# Patient Record
Sex: Female | Born: 1980 | Race: Black or African American | Hispanic: No | Smoking: Never smoker
Health system: Southern US, Community
[De-identification: ages and names within clinical notes are randomized; demographics above are authoritative.]

## PROBLEM LIST (undated history)

## (undated) DIAGNOSIS — Z8619 Personal history of other infectious and parasitic diseases: Secondary | ICD-10-CM

## (undated) DIAGNOSIS — J45909 Unspecified asthma, uncomplicated: Secondary | ICD-10-CM

## (undated) DIAGNOSIS — L309 Dermatitis, unspecified: Secondary | ICD-10-CM

## (undated) DIAGNOSIS — N9489 Other specified conditions associated with female genital organs and menstrual cycle: Secondary | ICD-10-CM

## (undated) DIAGNOSIS — H539 Unspecified visual disturbance: Secondary | ICD-10-CM

## (undated) DIAGNOSIS — T7840XA Allergy, unspecified, initial encounter: Secondary | ICD-10-CM

## (undated) DIAGNOSIS — F32A Depression, unspecified: Secondary | ICD-10-CM

## (undated) DIAGNOSIS — F329 Major depressive disorder, single episode, unspecified: Secondary | ICD-10-CM

## (undated) DIAGNOSIS — E669 Obesity, unspecified: Secondary | ICD-10-CM

## (undated) DIAGNOSIS — K219 Gastro-esophageal reflux disease without esophagitis: Secondary | ICD-10-CM

## (undated) HISTORY — DX: Major depressive disorder, single episode, unspecified: F32.9

## (undated) HISTORY — DX: Unspecified visual disturbance: H53.9

## (undated) HISTORY — DX: Personal history of other infectious and parasitic diseases: Z86.19

## (undated) HISTORY — DX: Other specified conditions associated with female genital organs and menstrual cycle: N94.89

## (undated) HISTORY — DX: Dermatitis, unspecified: L30.9

## (undated) HISTORY — PX: WISDOM TOOTH EXTRACTION: SHX21

## (undated) HISTORY — DX: Gastro-esophageal reflux disease without esophagitis: K21.9

## (undated) HISTORY — DX: Depression, unspecified: F32.A

## (undated) HISTORY — DX: Allergy, unspecified, initial encounter: T78.40XA

## (undated) HISTORY — DX: Obesity, unspecified: E66.9

---

## 2012-07-26 DIAGNOSIS — E669 Obesity, unspecified: Secondary | ICD-10-CM | POA: Insufficient documentation

## 2012-07-26 DIAGNOSIS — J302 Other seasonal allergic rhinitis: Secondary | ICD-10-CM | POA: Insufficient documentation

## 2012-07-26 DIAGNOSIS — D219 Benign neoplasm of connective and other soft tissue, unspecified: Secondary | ICD-10-CM | POA: Insufficient documentation

## 2012-07-26 HISTORY — DX: Obesity, unspecified: E66.9

## 2012-08-24 DIAGNOSIS — Z62810 Personal history of physical and sexual abuse in childhood: Secondary | ICD-10-CM | POA: Insufficient documentation

## 2013-09-30 DIAGNOSIS — Z3009 Encounter for other general counseling and advice on contraception: Secondary | ICD-10-CM | POA: Insufficient documentation

## 2014-09-30 ENCOUNTER — Telehealth: Payer: Self-pay | Admitting: *Deleted

## 2014-09-30 ENCOUNTER — Encounter (HOSPITAL_COMMUNITY): Payer: Self-pay | Admitting: Emergency Medicine

## 2014-09-30 ENCOUNTER — Emergency Department (HOSPITAL_COMMUNITY)
Admission: EM | Admit: 2014-09-30 | Discharge: 2014-09-30 | Disposition: A | Discharge status: Home / Self Care | Payer: 59 | Attending: Emergency Medicine | Admitting: Emergency Medicine

## 2014-09-30 DIAGNOSIS — J45909 Unspecified asthma, uncomplicated: Secondary | ICD-10-CM | POA: Diagnosis not present

## 2014-09-30 DIAGNOSIS — Z7951 Long term (current) use of inhaled steroids: Secondary | ICD-10-CM | POA: Diagnosis not present

## 2014-09-30 DIAGNOSIS — R251 Tremor, unspecified: Secondary | ICD-10-CM | POA: Insufficient documentation

## 2014-09-30 DIAGNOSIS — IMO0001 Reserved for inherently not codable concepts without codable children: Secondary | ICD-10-CM

## 2014-09-30 HISTORY — DX: Unspecified asthma, uncomplicated: J45.909

## 2014-09-30 LAB — COMPREHENSIVE METABOLIC PANEL
ALK PHOS: 55 U/L (ref 38–126)
ALT: 16 U/L (ref 14–54)
ANION GAP: 11 (ref 5–15)
AST: 21 U/L (ref 15–41)
Albumin: 3.5 g/dL (ref 3.5–5.0)
BILIRUBIN TOTAL: 0.8 mg/dL (ref 0.3–1.2)
BUN: 8 mg/dL (ref 6–20)
CO2: 21 mmol/L — ABNORMAL LOW (ref 22–32)
Calcium: 8.7 mg/dL — ABNORMAL LOW (ref 8.9–10.3)
Chloride: 103 mmol/L (ref 101–111)
Creatinine, Ser: 0.76 mg/dL (ref 0.44–1.00)
GFR calc Af Amer: >60 mL/min (ref >60)
GFR calc non Af Amer: >60 mL/min (ref >60)
GLUCOSE: 95 mg/dL (ref 65–99)
POTASSIUM: 3.4 mmol/L — AB (ref 3.5–5.1)
Sodium: 135 mmol/L (ref 135–145)
TOTAL PROTEIN: 7.1 g/dL (ref 6.5–8.1)

## 2014-09-30 LAB — CBC WITH DIFFERENTIAL/PLATELET
BASOS ABS: 0 10*3/uL (ref 0.0–0.1)
Basophils Relative: 0 % (ref 0–1)
EOS ABS: 0.1 10*3/uL (ref 0.0–0.7)
Eosinophils Relative: 1 % (ref 0–5)
HEMATOCRIT: 37.7 % (ref 36.0–46.0)
Hemoglobin: 13 g/dL (ref 12.0–15.0)
LYMPHS PCT: 14 % (ref 12–46)
Lymphs Abs: 1.3 10*3/uL (ref 0.7–4.0)
MCH: 30.8 pg (ref 26.0–34.0)
MCHC: 34.5 g/dL (ref 30.0–36.0)
MCV: 89.3 fL (ref 78.0–100.0)
Monocytes Absolute: 0.5 10*3/uL (ref 0.1–1.0)
Monocytes Relative: 6 % (ref 3–12)
NEUTROS PCT: 79 % — AB (ref 43–77)
Neutro Abs: 7.1 10*3/uL (ref 1.7–7.7)
PLATELETS: 332 10*3/uL (ref 150–400)
RBC: 4.22 MIL/uL (ref 3.87–5.11)
RDW: 12.7 % (ref 11.5–15.5)
WBC: 9.1 10*3/uL (ref 4.0–10.5)

## 2014-09-30 MED ORDER — POTASSIUM CHLORIDE CRYS ER 20 MEQ PO TBCR
40.0000 meq | EXTENDED_RELEASE_TABLET | Freq: Once | ORAL | Status: DC
  Filled 2014-09-30: qty 2

## 2014-09-30 MED ORDER — DIAZEPAM 5 MG PO TABS
5.0000 mg | ORAL_TABLET | Freq: Once | ORAL | Status: AC
  Administered 2014-09-30: 5 mg via ORAL
  Filled 2014-09-30: qty 1

## 2014-09-30 NOTE — Discharge Instructions (Signed)
Dystonias The dystonias are movement disorders in which sustained muscle contractions cause twisting and repetitive movements or abnormal postures. The movements, which are involuntary and sometimes painful, may affect a single muscle; a group of muscles such as those in the arms, legs, or neck; or the entire body. Early symptoms (problems) may include a deterioration in handwriting after writing several lines, foot cramps, and a tendency of one foot to pull up or drag after running or walking some distance. Other possible symptoms are tremor and voice or speech difficulties. Birth injury (particularly due to lack of oxygen), certain infections, reactions to certain drugs, heavy-metal or carbon monoxide poisoning, trauma (damage caused by an accident), or stroke can cause dystonic symptoms. About half the cases of dystonia have no connection to disease or injury and are called primary or idiopathic dystonia. Of the primary dystonias, many cases appear to be inherited in a dominant manner. Dystonias can also be symptoms of other diseases, some of which may be hereditary (passed down from parents). In some individuals, symptoms of a dystonia appear spontaneously in childhood between the ages of 5 and 16, usually in the foot or in the hand. For other individuals, the symptoms emerge in late adolescence or early adulthood. TREATMENT  No one treatment has been found universally effective for dystonia. Instead, physicians use a variety of therapies (medications, surgery and other treatments such as physical therapy, splinting, stress management, and biofeedback), aimed at reducing or eliminating muscle spasms and pain. Since response to drugs varies among patients and even in the same person over time, the therapy must be individualized. PROGNOSIS The initial symptoms can be very mild and may be noticeable only after prolonged exertion, stress, or fatigue. Over a period of time, the symptoms may become more  noticeable and widespread and be unrelenting; sometimes, however, there is little or no progression. RESEARCH BEING DONE Investigators believe that the dystonias result from an abnormality in an area of the brain called the basal ganglia, where some of the messages that initiate muscle contractions are processed. Scientists suspect a defect in the body's ability to process a group of chemicals called neurotransmitters that help cells in the brain communicate with each other. Scientists at the [facility] laboratories have conducted detailed investigations of the pattern of muscle activity in persons with dystonias. Studies using EEG analysis and neuroimaging are probing brain activity. The search for the gene or genes responsible for some forms of dominantly inherited dystonias continues. 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 In 1997, the team sequenced the DYT1 gene and found that it codes for a previously unknown protein now called "torsin A." Document Released: 03/11/2002 Document Revised: 06/13/2011 Document Reviewed: 05/15/2013 [employer] Patient Information 2015 [employer]. This information is not intended to replace advice given to you by your health care provider. Make sure you discuss any questions you have with your health care provider.  

## 2014-09-30 NOTE — ED Notes (Signed)
Pt ambulated to restroom with this RN by pt side per pt request. Pt informed to use call light in restroom when ready to go back to room.

## 2014-09-30 NOTE — ED Provider Notes (Signed)
CSN: [id number]     Arrival date & time 09/30/14  0256 History   First MD Initiated Contact with Patient 09/30/14 0559     Chief Complaint  Patient presents with  . Shaking     (Consider location/radiation/quality/duration/timing/severity/associated sxs/prior Treatment) HPI Comments: Patient presents to the ED with a chief complaint of twitching.  Patient states that she has had intermittent episodes of twitching since she was young.  Describes the twitching as a shaking of her head and sometimes her body.  She has no history of seizures.  There are no associated aggravating or alleviating factors.  She has not tried taking anything for her symptoms.  She states that she has seen a neurologist in the past, but they were unable to do anything for her.  She has had several episodes of twitching tonight.  The history is provided by the patient. No language interpreter was used.    Past Medical History  Diagnosis Date  . Asthma    History reviewed. No pertinent past surgical history. No family history on file. History  Substance Use Topics  . Smoking status: Never Smoker   . Smokeless tobacco: Not on file  . Alcohol Use: No   OB History    No data available     Review of Systems  Constitutional: Negative for fever and chills.  Respiratory: Negative for shortness of breath.   Cardiovascular: Negative for chest pain.  Gastrointestinal: Negative for nausea, vomiting, diarrhea and constipation.  Genitourinary: Negative for dysuria.  Neurological:       Shaking of head and body  All other systems reviewed and are negative.     Allergies  Review of patient's allergies indicates no known allergies.  Home Medications   Prior to Admission medications   Medication Sig Start Date End Date Taking? Authorizing Provider  beclomethasone (QVAR) 40 MCG/ACT inhaler Inhale 2 puffs into the lungs daily as needed (shorntess of breath).   Yes Historical Provider, MD  norelgestromin-ethinyl  estradiol Marilu Favre) 150-35 MCG/24HR transdermal patch Place 1 patch onto the skin once a week.   Yes Historical Provider, MD   BP 119/88 mmHg  Pulse 89  Temp(Src) 98.2 F (36.8 C) (Oral)  Resp 16  SpO2 99% Physical Exam  Constitutional: She is oriented to person, place, and time. She appears well-developed and well-nourished.  HENT:  Head: Normocephalic and atraumatic.  Eyes: Conjunctivae and EOM are normal. Pupils are equal, round, and reactive to light.  Neck: Normal range of motion. Neck supple.  Cardiovascular: Normal rate and regular rhythm.  Exam reveals no gallop and no friction rub.   No murmur heard. Pulmonary/Chest: Effort normal and breath sounds normal. No respiratory distress. She has no wheezes. She has no rales. She exhibits no tenderness.  CTAB  Abdominal: Soft. Bowel sounds are normal. She exhibits no distension and no mass. There is no tenderness. There is no rebound and no guarding.  Musculoskeletal: Normal range of motion. She exhibits no edema or tenderness.  Neurological: She is alert and oriented to person, place, and time.  Cranial nerves grossly intact, movements are goal oriented, speech is clear  Skin: Skin is warm and dry.  Psychiatric: She has a normal mood and affect. Her behavior is normal. Judgment and thought content normal.  Nursing note and vitals reviewed.   ED Course  Procedures (including critical care time) Results for orders placed or performed during the hospital encounter of 09/30/14  CBC with Differential  Result Value Ref Range  WBC 9.1 4.0 - 10.5 K/uL   RBC 4.22 3.87 - 5.11 MIL/uL   Hemoglobin 13.0 12.0 - 15.0 g/dL   HCT 37.7 36.0 - 46.0 %   MCV 89.3 78.0 - 100.0 fL   MCH 30.8 26.0 - 34.0 pg   MCHC 34.5 30.0 - 36.0 g/dL   RDW 12.7 11.5 - 15.5 %   Platelets 332 150 - 400 K/uL   Neutrophils Relative % 79 (H) 43 - 77 %   Neutro Abs 7.1 1.7 - 7.7 K/uL   Lymphocytes Relative 14 12 - 46 %   Lymphs Abs 1.3 0.7 - 4.0 K/uL   Monocytes  Relative 6 3 - 12 %   Monocytes Absolute 0.5 0.1 - 1.0 K/uL   Eosinophils Relative 1 0 - 5 %   Eosinophils Absolute 0.1 0.0 - 0.7 K/uL   Basophils Relative 0 0 - 1 %   Basophils Absolute 0.0 0.0 - 0.1 K/uL  Comprehensive metabolic panel  Result Value Ref Range   Sodium 135 135 - 145 mmol/L   Potassium 3.4 (L) 3.5 - 5.1 mmol/L   Chloride 103 101 - 111 mmol/L   CO2 21 (L) 22 - 32 mmol/L   Glucose, Bld 95 65 - 99 mg/dL   BUN 8 6 - 20 mg/dL   Creatinine, Ser 0.76 0.44 - 1.00 mg/dL   Calcium 8.7 (L) 8.9 - 10.3 mg/dL   Total Protein 7.1 6.5 - 8.1 g/dL   Albumin 3.5 3.5 - 5.0 g/dL   AST 21 15 - 41 U/L   ALT 16 14 - 54 U/L   Alkaline Phosphatase 55 38 - 126 U/L   Total Bilirubin 0.8 0.3 - 1.2 mg/dL   GFR calc non Af Amer >60 >60 mL/min   GFR calc Af Amer >60 >60 mL/min   Anion gap 11 5 - 15   No results found.    EKG Interpretation None      MDM   Final diagnoses:  Shaking spells    Patient with rather bizarre episodes of twitching.  The motion is a rapid rotating back and forth of the trunk.  Patient is alert and oriented during these episodes.  She is neurologically intact.  I believe the symptoms to be psychosomatic in nature.  Labs are reassuring.  She is slightly hypokalemic; will replace potassium.  Will give valium.  DC to home with neurology/pysch follow-up.  Driving restrictions in place.  Patient understands and agrees with the plan.  She is stable and ready for discharge.    Montine Circle, PA-C 03/70/48 [id number]  Delora Fuel, MD 69/45/03 [id number]

## 2014-09-30 NOTE — Telephone Encounter (Signed)
Patient cancelled her new patient appointment she got in somewhere else sooner

## 2014-09-30 NOTE — ED Notes (Signed)
Pt. reports intermittent generalized shaking of body onset this morning , alert and oriented at arrival/ respirations unlabored .

## 2014-10-02 ENCOUNTER — Encounter: Payer: Self-pay | Admitting: Neurology

## 2014-10-02 ENCOUNTER — Ambulatory Visit (INDEPENDENT_AMBULATORY_CARE_PROVIDER_SITE_OTHER): Payer: 59 | Admitting: Neurology

## 2014-10-02 VITALS — BP 110/80 | HR 78 | Resp 12 | Ht 62.0 in | Wt 164.8 lb

## 2014-10-02 DIAGNOSIS — J4520 Mild intermittent asthma, uncomplicated: Secondary | ICD-10-CM | POA: Insufficient documentation

## 2014-10-02 DIAGNOSIS — F418 Other specified anxiety disorders: Secondary | ICD-10-CM

## 2014-10-02 DIAGNOSIS — F339 Major depressive disorder, recurrent, unspecified: Secondary | ICD-10-CM | POA: Insufficient documentation

## 2014-10-02 DIAGNOSIS — G4089 Other seizures: Secondary | ICD-10-CM | POA: Diagnosis not present

## 2014-10-02 DIAGNOSIS — R253 Fasciculation: Secondary | ICD-10-CM | POA: Diagnosis not present

## 2014-10-02 DIAGNOSIS — F445 Conversion disorder with seizures or convulsions: Secondary | ICD-10-CM | POA: Insufficient documentation

## 2014-10-02 DIAGNOSIS — F329 Major depressive disorder, single episode, unspecified: Secondary | ICD-10-CM | POA: Insufficient documentation

## 2014-10-02 MED ORDER — ESCITALOPRAM OXALATE 10 MG PO TABS: 10.0000 mg | ORAL_TABLET | Freq: Every day | ORAL | Status: DC
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
## 2014-10-03 DIAGNOSIS — F445 Conversion disorder with seizures or convulsions: Secondary | ICD-10-CM | POA: Insufficient documentation

## 2014-10-09 ENCOUNTER — Ambulatory Visit (INDEPENDENT_AMBULATORY_CARE_PROVIDER_SITE_OTHER): Payer: 59 | Admitting: Neurology

## 2014-10-09 DIAGNOSIS — R253 Fasciculation: Secondary | ICD-10-CM

## 2014-10-09 DIAGNOSIS — F445 Conversion disorder with seizures or convulsions: Secondary | ICD-10-CM

## 2014-10-10 ENCOUNTER — Telehealth: Payer: Self-pay | Admitting: *Deleted

## 2014-10-10 ENCOUNTER — Telehealth: Payer: Self-pay | Admitting: Neurology

## 2014-10-10 DIAGNOSIS — R569 Unspecified convulsions: Secondary | ICD-10-CM

## 2014-10-10 DIAGNOSIS — F445 Conversion disorder with seizures or convulsions: Secondary | ICD-10-CM

## 2014-10-10 DIAGNOSIS — F418 Other specified anxiety disorders: Secondary | ICD-10-CM

## 2014-10-10 NOTE — Procedures (Signed)
    History:  Jennifer Dean is a 34 year old patient with a history of episodes of upper body jerking and twisting that occurred on 09/30/2014. The patient had 8 or 9 such episodes lasting up to 15-30 seconds at a time. The patient is being evaluated for possible seizures.  This is a routine EEG. No skull defects are noted. Medications include Lexapro, and birth control pills.    EEG classification: Normal awake and drowsy  Description of the recording: The background rhythms of this recording consists of a fairly well modulated medium amplitude alpha rhythm of 9 Hz that is reactive to eye opening and closure. As the record progresses, the patient appears to remain in the waking state throughout the recording. Photic stimulation was performed, resulting in a bilateral and symmetric photic driving response. Hyperventilation was also performed, resulting in a minimal buildup of the background rhythm activities without significant slowing seen. Toward the end of the recording, the patient enters the drowsy state with slight symmetric slowing seen. The patient never enters stage II sleep. At no time during the recording does there appear to be evidence of spike or spike wave discharges or evidence of focal slowing. EKG monitor shows no evidence of cardiac rhythm abnormalities with a heart rate of 72.  Impression: This is a normal EEG recording in the waking and drowsy state. No evidence of ictal or interictal discharges are seen.

## 2014-10-10 NOTE — Telephone Encounter (Signed)
I have spoken with Jennifer Dean this morning and per RAS, advised that her EEG was normal; that episodes are most likely due to psych stress.  She verbalized understanding of same, sts. she has not gotten a call to schedule psych eval yet--order for referral to behavioral health entered today/fim

## 2014-10-10 NOTE — Telephone Encounter (Signed)
Patient calling requesting results from EEG. Please call and advise. Patient can be reached at [phone].

## 2014-10-10 NOTE — Telephone Encounter (Signed)
I have spoken with Jennifer Dean this morning and per RAS, advised that EEG was normal, that her episodes are likely due to psych stress.  She verbalized understanding of same, sts. she has not received a call to schedule psych eval yet.  Order for referral to [place] entered today/fim

## 2014-10-10 NOTE — Telephone Encounter (Signed)
Patient returned call. Please call and advise.  °

## 2014-10-10 NOTE — Telephone Encounter (Signed)
-----   Message from Britt Bottom, MD sent at 10/10/2014  8:35 AM EDT ----- Please let her know that the EEG was normal. As we discussed during the visit, the episodes most likely represent a reaction to psychiatric stress...  Has psych evaluation been scheduled yet/

## 2014-10-10 NOTE — Telephone Encounter (Signed)
LMTC./fim 

## 2014-10-30 ENCOUNTER — Ambulatory Visit: Payer: 59 | Admitting: Neurology

## 2014-11-10 ENCOUNTER — Ambulatory Visit (INDEPENDENT_AMBULATORY_CARE_PROVIDER_SITE_OTHER): Payer: 59 | Admitting: Licensed Clinical Social Worker

## 2014-11-10 DIAGNOSIS — F419 Anxiety disorder, unspecified: Secondary | ICD-10-CM | POA: Diagnosis not present

## 2014-11-26 ENCOUNTER — Ambulatory Visit: Payer: 59 | Admitting: Licensed Clinical Social Worker

## 2014-12-10 ENCOUNTER — Ambulatory Visit (INDEPENDENT_AMBULATORY_CARE_PROVIDER_SITE_OTHER): Payer: 59 | Admitting: Internal Medicine

## 2014-12-10 ENCOUNTER — Encounter: Payer: Self-pay | Admitting: Physician Assistant

## 2014-12-10 VITALS — BP 126/87 | HR 86 | Temp 99.5°F | Resp 16

## 2014-12-10 DIAGNOSIS — R0602 Shortness of breath: Secondary | ICD-10-CM

## 2014-12-10 DIAGNOSIS — Z8709 Personal history of other diseases of the respiratory system: Secondary | ICD-10-CM | POA: Diagnosis not present

## 2014-12-10 MED ORDER — ALBUTEROL SULFATE (2.5 MG/3ML) 0.083% IN NEBU: 2.5000 mg | INHALATION_SOLUTION | Freq: Four times a day (QID) | RESPIRATORY_TRACT | Status: DC | PRN

## 2014-12-10 MED ORDER — LORAZEPAM 1 MG PO TABS: 1.0000 mg | ORAL_TABLET | Freq: Two times a day (BID) | ORAL | Status: DC | PRN
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
## 2014-12-11 ENCOUNTER — Telehealth: Payer: Self-pay | Admitting: Family Medicine

## 2014-12-11 MED ORDER — FULL KIT NEBULIZER SET MISC: 1.0000 [IU] | Status: DC | PRN

## 2014-12-11 NOTE — Telephone Encounter (Signed)
Thank you Pamala Hurry! I will print it off and hand it to a TL.

## 2014-12-11 NOTE — Telephone Encounter (Signed)
Got fax back from [facility] stating that they do not carry nebulizer machines. They suggested we send order to [facility]. I called [facility] where Ronalee Belts having appts and spoke to Dante who will let Guerry Bruin know this needs to be re-sent. Advised that [facility] also has nebulizers.

## 2014-12-11 NOTE — Telephone Encounter (Signed)
Left message nebulizer sent to pharmacy.

## 2014-12-11 NOTE — Telephone Encounter (Signed)
Ronalee Belts, see below. You will need to print off Rx and have asst or TL fax to [facility] or [facility]. Thanks!

## 2014-12-11 NOTE — Telephone Encounter (Signed)
Patient was seen yesterday and when she went to the pharmacy, she was prescribed nebulizer meds instead of the nebulizer.  Can we send in the actual nebulizer prescription and notify her when it is sent in.

## 2014-12-11 NOTE — Telephone Encounter (Signed)
Script for nebulizer kit sent to her pharmacy.

## 2014-12-12 ENCOUNTER — Other Ambulatory Visit: Payer: Self-pay | Admitting: *Deleted

## 2014-12-12 MED ORDER — ALBUTEROL SULFATE HFA 108 (90 BASE) MCG/ACT IN AERS: 2.0000 | INHALATION_SPRAY | Freq: Four times a day (QID) | RESPIRATORY_TRACT | Status: DC | PRN

## 2014-12-12 NOTE — Telephone Encounter (Signed)
Faxed to [facility]

## 2014-12-12 NOTE — Telephone Encounter (Signed)
Pt wanted inhaler instead of nebulizer med.  Per Legrand Como inhaler ordered

## 2014-12-15 ENCOUNTER — Ambulatory Visit (INDEPENDENT_AMBULATORY_CARE_PROVIDER_SITE_OTHER): Payer: 59 | Admitting: Licensed Clinical Social Worker

## 2014-12-15 DIAGNOSIS — F419 Anxiety disorder, unspecified: Secondary | ICD-10-CM

## 2014-12-25 ENCOUNTER — Encounter: Payer: Self-pay | Admitting: Urgent Care

## 2014-12-25 ENCOUNTER — Ambulatory Visit (INDEPENDENT_AMBULATORY_CARE_PROVIDER_SITE_OTHER): Payer: 59 | Admitting: Urgent Care

## 2014-12-25 VITALS — BP 110/80 | HR 78 | Temp 98.0°F | Resp 16 | Ht 62.5 in | Wt 162.6 lb

## 2014-12-25 DIAGNOSIS — J302 Other seasonal allergic rhinitis: Secondary | ICD-10-CM | POA: Diagnosis not present

## 2014-12-25 DIAGNOSIS — H938X3 Other specified disorders of ear, bilateral: Secondary | ICD-10-CM | POA: Diagnosis not present

## 2014-12-25 DIAGNOSIS — Z8709 Personal history of other diseases of the respiratory system: Secondary | ICD-10-CM | POA: Diagnosis not present

## 2014-12-25 DIAGNOSIS — J329 Chronic sinusitis, unspecified: Secondary | ICD-10-CM | POA: Insufficient documentation

## 2014-12-25 DIAGNOSIS — R011 Cardiac murmur, unspecified: Secondary | ICD-10-CM

## 2014-12-25 DIAGNOSIS — R0981 Nasal congestion: Secondary | ICD-10-CM | POA: Diagnosis not present

## 2014-12-25 MED ORDER — LEVOCETIRIZINE DIHYDROCHLORIDE 5 MG PO TABS: 5.0000 mg | ORAL_TABLET | Freq: Every evening | ORAL | Status: DC

## 2014-12-25 MED ORDER — AMOXICILLIN 875 MG PO TABS: 875.0000 mg | ORAL_TABLET | Freq: Two times a day (BID) | ORAL | Status: DC

## 2014-12-25 MED ORDER — FLUTICASONE PROPIONATE 50 MCG/ACT NA SUSP: 2.0000 | Freq: Every day | NASAL | Status: DC

## 2014-12-25 NOTE — Patient Instructions (Signed)
- Try Sudafed 166m twice a day for immediate relief of nasal congestion and ear pressure. - For appropriate administration of the nasal spray, clear the nose, use opposite hand for opposite nare, sniff gently, exhale through your mouth. - Continue Allegra, Claritin or Zyrtec each day, as needed. - Drink at least 64 ounces of water each day. - If you have a humidifier use it nightly. - Remove as many irritants/allergies as you are able to, no pets in the bedroom, change air filters in air vents.   Sinusitis Sinusitis is redness, soreness, and inflammation of the paranasal sinuses. Paranasal sinuses are air pockets within the bones of your face (beneath the eyes, the middle of the forehead, or above the eyes). In healthy paranasal sinuses, mucus is able to drain out, and air is able to circulate through them by way of your nose. However, when your paranasal sinuses are inflamed, mucus and air can become trapped. This can allow bacteria and other germs to grow and cause infection. Sinusitis can develop quickly and last only a short time (acute) or continue over a long period (chronic). Sinusitis that lasts for more than 12 weeks is considered chronic.  CAUSES  Causes of sinusitis include:  Allergies.  Structural abnormalities, such as displacement of the cartilage that separates your nostrils (deviated septum), which can decrease the air flow through your nose and sinuses and affect sinus drainage.  Functional abnormalities, such as when the small hairs (cilia) that line your sinuses and help remove mucus do not work properly or are not present. SIGNS AND SYMPTOMS  Symptoms of acute and chronic sinusitis are the same. The primary symptoms are pain and pressure around the affected sinuses. Other symptoms include:  Upper toothache.  Earache.  Headache.  Bad breath.  Decreased sense of smell and taste.  A cough, which worsens when you are lying flat.  Fatigue.  Fever.  Thick drainage  from your nose, which often is green and may contain pus (purulent).  Swelling and warmth over the affected sinuses. DIAGNOSIS  Your health care provider will perform a physical exam. During the exam, your health care provider may:  Look in your nose for signs of abnormal growths in your nostrils (nasal polyps).  Tap over the affected sinus to check for signs of infection.  View the inside of your sinuses (endoscopy) using an imaging device that has a light attached (endoscope). If your health care provider suspects that you have chronic sinusitis, one or more of the following tests may be recommended:  Allergy tests.  Nasal culture. A sample of mucus is taken from your nose, sent to a lab, and screened for bacteria.  Nasal cytology. A sample of mucus is taken from your nose and examined by your health care provider to determine if your sinusitis is related to an allergy. TREATMENT  Most cases of acute sinusitis are related to a viral infection and will resolve on their own within 10 days. Sometimes medicines are prescribed to help relieve symptoms (pain medicine, decongestants, nasal steroid sprays, or saline sprays).  However, for sinusitis related to a bacterial infection, your health care provider will prescribe antibiotic medicines. These are medicines that will help kill the bacteria causing the infection.  Rarely, sinusitis is caused by a fungal infection. In theses cases, your health care provider will prescribe antifungal medicine. For some cases of chronic sinusitis, surgery is needed. Generally, these are cases in which sinusitis recurs more than 3 times per year, despite other treatments.  HOME CARE INSTRUCTIONS   Drink plenty of water. Water helps thin the mucus so your sinuses can drain more easily.  Use a humidifier.  Inhale steam 3 to 4 times a day (for example, sit in the bathroom with the shower running).  Apply a warm, moist washcloth to your face 3 to 4 times a day,  or as directed by your health care provider.  Use saline nasal sprays to help moisten and clean your sinuses.  Take medicines only as directed by your health care provider.  If you were prescribed either an antibiotic or antifungal medicine, finish it all even if you start to feel better. SEEK IMMEDIATE MEDICAL CARE IF:  You have increasing pain or severe headaches.  You have nausea, vomiting, or drowsiness.  You have swelling around your face.  You have vision problems.  You have a stiff neck.  You have difficulty breathing. MAKE SURE YOU:   Understand these instructions.  Will watch your condition.  Will get help right away if you are not doing well or get worse. Document Released: 03/21/2005 Document Revised: 08/05/2013 Document Reviewed: 04/05/2011 [facility] Patient Information 2015 [place], [place]. This information is not intended to replace advice given to you by your health care provider. Make sure you discuss any questions you have with your health care provider.   Allergies Allergies may happen from anything your body is sensitive to. This may be food, medicines, pollens, chemicals, and nearly anything around you in everyday life that produces allergens. An allergen is anything that causes an allergy producing substance. Heredity is often a factor in causing these problems. This means you may have some of the same allergies as your parents. Food allergies happen in all age groups. Food allergies are some of the most severe and life threatening. Some common food allergies are cow's milk, seafood, eggs, nuts, wheat, and soybeans. SYMPTOMS   Swelling around the mouth.  An itchy red rash or hives.  Vomiting or diarrhea.  Difficulty breathing. SEVERE ALLERGIC REACTIONS ARE LIFE-THREATENING. This reaction is called anaphylaxis. It can cause the mouth and throat to swell and cause difficulty with breathing and swallowing. In severe reactions only a trace amount of food  (for example, peanut oil in a salad) may cause death within seconds. Seasonal allergies occur in all age groups. These are seasonal because they usually occur during the same season every year. They may be a reaction to molds, grass pollens, or tree pollens. Other causes of problems are house dust mite allergens, pet dander, and mold spores. The symptoms often consist of nasal congestion, a runny itchy nose associated with sneezing, and tearing itchy eyes. There is often an associated itching of the mouth and ears. The problems happen when you come in contact with pollens and other allergens. Allergens are the particles in the air that the body reacts to with an allergic reaction. This causes you to release allergic antibodies. Through a chain of events, these eventually cause you to release histamine into the blood stream. Although it is meant to be protective to the body, it is this release that causes your discomfort. This is why you were given anti-histamines to feel better. If you are unable to pinpoint the offending allergen, it may be determined by skin or blood testing. Allergies cannot be cured but can be controlled with medicine. Hay fever is a collection of all or some of the seasonal allergy problems. It may often be treated with simple over-the-counter medicine such as diphenhydramine. Take  medicine as directed. Do not drink alcohol or drive while taking this medicine. Check with your caregiver or package insert for child dosages. If these medicines are not effective, there are many new medicines your caregiver can prescribe. Stronger medicine such as nasal spray, eye drops, and corticosteroids may be used if the first things you try do not work well. Other treatments such as immunotherapy or desensitizing injections can be used if all else fails. Follow up with your caregiver if problems continue. These seasonal allergies are usually not life threatening. They are generally more of a nuisance that  can often be handled using medicine. HOME CARE INSTRUCTIONS   If unsure what causes a reaction, keep a diary of foods eaten and symptoms that follow. Avoid foods that cause reactions.  If hives or rash are present:  Take medicine as directed.  You may use an over-the-counter antihistamine (diphenhydramine) for hives and itching as needed.  Apply cold compresses (cloths) to the skin or take baths in cool water. Avoid hot baths or showers. Heat will make a rash and itching worse.  If you are severely allergic:  Following a treatment for a severe reaction, hospitalization is often required for closer follow-up.  Wear a medic-alert bracelet or necklace stating the allergy.  You and your family must learn how to give adrenaline or use an anaphylaxis kit.  If you have had a severe reaction, always carry your anaphylaxis kit or EpiPen with you. Use this medicine as directed by your caregiver if a severe reaction is occurring. Failure to do so could have a fatal outcome. SEEK MEDICAL CARE IF:  You suspect a food allergy. Symptoms generally happen within 30 minutes of eating a food.  Your symptoms have not gone away within 2 days or are getting worse.  You develop new symptoms.  You want to retest yourself or your child with a food or drink you think causes an allergic reaction. Never do this if an anaphylactic reaction to that food or drink has happened before. Only do this under the care of a caregiver. SEEK IMMEDIATE MEDICAL CARE IF:   You have difficulty breathing, are wheezing, or have a tight feeling in your chest or throat.  You have a swollen mouth, or you have hives, swelling, or itching all over your body.  You have had a severe reaction that has responded to your anaphylaxis kit or an EpiPen. These reactions may return when the medicine has worn off. These reactions should be considered life threatening. MAKE SURE YOU:   Understand these instructions.  Will watch your  condition.  Will get help right away if you are not doing well or get worse. Document Released: 06/14/2002 Document Revised: 07/16/2012 Document Reviewed: 11/19/2007 [facility] Patient Information 2015 [place], [place]. This information is not intended to replace advice given to you by your health care provider. Make sure you discuss any questions you have with your health care provider.

## 2014-12-25 NOTE — Progress Notes (Signed)
    MRN: [id number] DOB: 1980-10-11  Subjective:   Jennifer Dean is a 34 y.o. female presenting for chief complaint of EAR PRESSURE and Cough  Reports ~1.5 month history of sinus congestion, allergies. In the past week, has progressed to sinus headache, sinus pain, ear pressure, dizziness, dry cough, dry scratchy throat. Has been taking benadryl nightly, generic sinus medication. Denies fever, red eyes, ear pain, ear drainage, tooth pain, chest pain, shob, wheezing, n/v, abdominal pain. Denies any other aggravating or relieving factors, no other questions or concerns.  Jennifer Dean has a current medication list which includes the following prescription(s): albuterol, escitalopram, lorazepam, norelgestromin-ethinyl estradiol, full kit nebulizer set, tetrahydrozoline, and albuterol. Also is allergic to morphine and related and codeine.  Jennifer Dean  has a past medical history of Asthma and Vision abnormalities. Also  has no past surgical history on file.  Objective:   Vitals: BP 110/80 mmHg  Pulse 78  Temp(Src) 98 F (36.7 C) (Oral)  Resp 16  Ht 5' 2.5" (1.588 m)  Wt 162 lb 9.6 oz (73.755 kg)  BMI 29.25 kg/m2  LMP 12/17/2014  Physical Exam  Constitutional: She is oriented to person, place, and time. She appears well-developed and well-nourished.  HENT:  TM's flat bilaterally, no effusions or erythema. Nasal turbinates boggy and edematous with 1 possible polyp in right side. No sinus tenderness. Throat without oropharyngeal exudates, erythema or abscesses.   Eyes: Conjunctivae and EOM are normal. Pupils are equal, round, and reactive to light. Right eye exhibits no discharge. Left eye exhibits no discharge. No scleral icterus.  Neck: Normal range of motion. Neck supple.  Cardiovascular: Normal rate, regular rhythm and intact distal pulses.  Exam reveals no gallop and no friction rub.   Murmur (low grade I/VI systolic ejection murmur) heard. Pulmonary/Chest: No respiratory distress. She  has no wheezes. She has no rales.  Lymphadenopathy:    She has no cervical adenopathy.  Neurological: She is alert and oriented to person, place, and time.  Skin: Skin is warm and dry. No rash noted. No erythema. No pallor.   Assessment and Plan :   1. Sinusitis, unspecified chronicity, unspecified location 2. Seasonal allergies 3. Ear pressure, bilateral 4. Nasal congestion - Suspect sinusitis, possible ETD secondary to long-standing uncontrolled allergies. Start amoxicillin for 10 days to cover for infectious process. Advise patient control allergies better and more consistently with Flonase and that is the tear is seen daily. Reviewed allergy treatment instructions with patient. She is to followup with me in 2 weeks and if no improvement in her symptoms we'll refer her to ENT. Patient agreed.  5. History of asthma - Stable  6. Heart murmur - Suspect this is a functional heart murmur. Monitor.  Jaynee Eagles, PA-C [facility] [phone] 12/25/2014 2:17 PM

## 2015-01-01 ENCOUNTER — Ambulatory Visit: Payer: 59 | Admitting: Urgent Care

## 2015-01-01 ENCOUNTER — Encounter: Payer: 59 | Admitting: Urgent Care

## 2015-01-07 ENCOUNTER — Ambulatory Visit: Payer: 59 | Admitting: Neurology

## 2015-01-20 ENCOUNTER — Encounter: Payer: Self-pay | Admitting: Neurology

## 2015-01-20 ENCOUNTER — Ambulatory Visit (INDEPENDENT_AMBULATORY_CARE_PROVIDER_SITE_OTHER): Payer: 59 | Admitting: Neurology

## 2015-01-20 VITALS — BP 124/88 | HR 70 | Resp 14 | Ht 62.5 in | Wt 164.6 lb

## 2015-01-20 DIAGNOSIS — F445 Conversion disorder with seizures or convulsions: Secondary | ICD-10-CM

## 2015-01-20 DIAGNOSIS — F418 Other specified anxiety disorders: Secondary | ICD-10-CM

## 2015-01-20 DIAGNOSIS — G4700 Insomnia, unspecified: Secondary | ICD-10-CM

## 2015-01-20 MED ORDER — TRAZODONE HCL 50 MG PO TABS: 50.0000 mg | ORAL_TABLET | Freq: Every day | ORAL | Status: DC
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
## 2015-02-10 ENCOUNTER — Ambulatory Visit (INDEPENDENT_AMBULATORY_CARE_PROVIDER_SITE_OTHER): Payer: 59 | Admitting: Family Medicine

## 2015-02-10 ENCOUNTER — Encounter: Payer: Self-pay | Admitting: Family Medicine

## 2015-02-10 VITALS — BP 123/77 | HR 76 | Temp 97.4°F | Resp 16 | Ht 62.5 in | Wt 166.0 lb

## 2015-02-10 DIAGNOSIS — J4521 Mild intermittent asthma with (acute) exacerbation: Secondary | ICD-10-CM

## 2015-02-10 DIAGNOSIS — R05 Cough: Secondary | ICD-10-CM | POA: Diagnosis not present

## 2015-02-10 DIAGNOSIS — R059 Cough, unspecified: Secondary | ICD-10-CM

## 2015-02-10 MED ORDER — PREDNISONE 20 MG PO TABS: 40.0000 mg | ORAL_TABLET | Freq: Every day | ORAL | Status: DC

## 2015-02-10 MED ORDER — BECLOMETHASONE DIPROPIONATE 40 MCG/ACT IN AERS: 1.0000 | INHALATION_SPRAY | Freq: Two times a day (BID) | RESPIRATORY_TRACT | Status: DC

## 2015-02-10 MED ORDER — BENZONATATE 100 MG PO CAPS: 100.0000 mg | ORAL_CAPSULE | Freq: Three times a day (TID) | ORAL | Status: DC | PRN

## 2015-02-10 NOTE — Patient Instructions (Signed)
Use your albuterol inhaler every 4-6 hours for the next 3 days Asthma, Adult Asthma is a recurring condition in which the airways tighten and narrow. Asthma can make it difficult to breathe. It can cause coughing, wheezing, and shortness of breath. Asthma episodes, also called asthma attacks, range from minor to life-threatening. Asthma cannot be cured, but medicines and lifestyle changes can help control it. CAUSES Asthma is believed to be caused by inherited (genetic) and environmental factors, but its exact cause is unknown. Asthma may be triggered by allergens, lung infections, or irritants in the air. Asthma triggers are different for each person. Common triggers include:   Animal dander.  Dust mites.  Cockroaches.  Pollen from trees or grass.  Mold.  Smoke.  Air pollutants such as dust, household cleaners, hair sprays, aerosol sprays, paint fumes, strong chemicals, or strong odors.  Cold air, weather changes, and winds (which increase molds and pollens in the air).  Strong emotional expressions such as crying or laughing hard.  Stress.  Certain medicines (such as aspirin) or types of drugs (such as beta-blockers).  Sulfites in foods and drinks. Foods and drinks that may contain sulfites include dried fruit, potato chips, and sparkling grape juice.  Infections or inflammatory conditions such as the flu, a cold, or an inflammation of the nasal membranes (rhinitis).  Gastroesophageal reflux disease (GERD).  Exercise or strenuous activity. SYMPTOMS Symptoms may occur immediately after asthma is triggered or many hours later. Symptoms include:  Wheezing.  Excessive nighttime or early morning coughing.  Frequent or severe coughing with a common cold.  Chest tightness.  Shortness of breath. DIAGNOSIS  The diagnosis of asthma is made by a review of your medical history and a physical exam. Tests may also be performed. These may include:  Lung function studies. These  tests show how much air you breathe in and out.  Allergy tests.  Imaging tests such as X-rays. TREATMENT  Asthma cannot be cured, but it can usually be controlled. Treatment involves identifying and avoiding your asthma triggers. It also involves medicines. There are 2 classes of medicine used for asthma treatment:   Controller medicines. These prevent asthma symptoms from occurring. They are usually taken every day.  Reliever or rescue medicines. These quickly relieve asthma symptoms. They are used as needed and provide short-term relief. Your health care provider will help you create an asthma action plan. An asthma action plan is a written plan for managing and treating your asthma attacks. It includes a list of your asthma triggers and how they may be avoided. It also includes information on when medicines should be taken and when their dosage should be changed. An action plan may also involve the use of a device called a peak flow meter. A peak flow meter measures how well the lungs are working. It helps you monitor your condition. HOME CARE INSTRUCTIONS   Take medicines only as directed by your health care provider. Speak with your health care provider if you have questions about how or when to take the medicines.  Use a peak flow meter as directed by your health care provider. Record and keep track of readings.  Understand and use the action plan to help minimize or stop an asthma attack without needing to seek medical care.  Control your home environment in the following ways to help prevent asthma attacks:  Do not smoke. Avoid being exposed to secondhand smoke.  Change your heating and air conditioning filter regularly.  Limit your use of  fireplaces and wood stoves.  Get rid of pests (such as roaches and mice) and their droppings.  Throw away plants if you see mold on them.  Clean your floors and dust regularly. Use unscented cleaning products.  Try to have someone else  vacuum for you regularly. Stay out of rooms while they are being vacuumed and for a short while afterward. If you vacuum, use a dust mask from a hardware store, a double-layered or microfilter vacuum cleaner bag, or a vacuum cleaner with a HEPA filter.  Replace carpet with wood, tile, or vinyl flooring. Carpet can trap dander and dust.  Use allergy-proof pillows, mattress covers, and box spring covers.  Wash bed sheets and blankets every week in hot water and dry them in a dryer.  Use blankets that are made of polyester or cotton.  Clean bathrooms and kitchens with bleach. If possible, have someone repaint the walls in these rooms with mold-resistant paint. Keep out of the rooms that are being cleaned and painted.  Wash hands frequently. SEEK MEDICAL CARE IF:   You have wheezing, shortness of breath, or a cough even if taking medicine to prevent attacks.  The colored mucus you cough up (sputum) is thicker than usual.  Your sputum changes from clear or white to yellow, green, gray, or bloody.  You have any problems that may be related to the medicines you are taking (such as a rash, itching, swelling, or trouble breathing).  You are using a reliever medicine more than 2-3 times per week.  Your peak flow is still at 50-79% of your personal best after following your action plan for 1 hour.  You have a fever. SEEK IMMEDIATE MEDICAL CARE IF:   You seem to be getting worse and are unresponsive to treatment during an asthma attack.  You are short of breath even at rest.  You get short of breath when doing very little physical activity.  You have difficulty eating, drinking, or talking due to asthma symptoms.  You develop chest pain.  You develop a fast heartbeat.  You have a bluish color to your lips or fingernails.  You are light-headed, dizzy, or faint.  Your peak flow is less than 50% of your personal best.   This information is not intended to replace advice given to you  by your health care provider. Make sure you discuss any questions you have with your health care provider.   Document Released: 03/21/2005 Document Revised: 12/10/2014 Document Reviewed: 10/18/2012 Elsevier Interactive Patient Education [employer].

## 2015-02-10 NOTE — Progress Notes (Signed)
   Subjective:    Patient ID: Jennifer Dean, female    DOB: 02/22/1981, 34 y.o.   MRN: [id number]  HPI This is a pleasant 34 yo female who works in the clerical department at [employer]. She presents today with persistent nonproductive cough for several months. Her cough is dry and she has coughing spasms. Some relief with cough drops. She has tried prevacid, robitussin, mucinex DM without improvement. She has gotten some short term relief from using her albuterol inhaler. She can cough at any time, but has noticed that she has worsening cough when eating and at night. Has had throat clearing throughout the day. No nasal drainage. No headache, no ear pain, no sore throat. No wheezing. No fever. No SOB. No heart burn, no water brash, no abdominal pain, no nausea, no vomiting, no diarrhea or constipation.   She has a history of asthma since childhood and has always been maintained on albuterol inhaler.   Past Medical History  Diagnosis Date  . Asthma   . Vision abnormalities    No past surgical history on file. Family History  Problem Relation Age of Onset  . Hypertension Mother   . GER disease Mother   . Hypertension Father   . Post-traumatic stress disorder Father    Social History  Substance Use Topics  . Smoking status: Never Smoker   . Smokeless tobacco: None  . Alcohol Use: No    Review of Systems Per HPI    Objective:   Physical Exam  Constitutional: She is oriented to person, place, and time. She appears well-developed and well-nourished. No distress.  HENT:  Head: Normocephalic and atraumatic.  Right Ear: External ear normal.  Left Ear: External ear normal.  Post nasal drainage   Eyes: Conjunctivae are normal. Pupils are equal, round, and reactive to light.  Cardiovascular: Normal rate, regular rhythm and normal heart sounds.   Pulmonary/Chest: Effort normal and breath sounds normal.  Musculoskeletal: Normal range of motion.  Neurological: She is alert and oriented  to person, place, and time.  Skin: Skin is warm and dry. She is not diaphoretic.  Psychiatric: She has a normal mood and affect. Her behavior is normal. Judgment and thought content normal.  Vitals reviewed.  BP 123/77 mmHg  Pulse 76  Temp(Src) 97.4 F (36.3 C)  Resp 16  Ht 5' 2.5" (1.588 m)  Wt 166 lb (75.297 kg)  BMI 29.86 kg/m2  Wt Readings from Last 3 Encounters:  02/10/15 166 lb (75.297 kg)  01/20/15 164 lb 9.6 oz (74.662 kg)  12/25/14 162 lb 9.6 oz (73.755 kg)   PF- pre albuterol inhaler 320 (66% predicted) , post 2 puffs albuterol inhaler 340 (70% predicted)     Assessment & Plan:  1. Asthma, mild intermittent, with acute exacerbation - beclomethasone (QVAR) 40 MCG/ACT inhaler; Inhale 1 puff into the lungs 2 (two) times daily.  Dispense: 1 Inhaler; Refill: 12 - predniSONE (DELTASONE) 20 MG tablet; Take 2 tablets (40 mg total) by mouth daily with breakfast.  Dispense: 6 tablet; Refill: 0  2. Cough - benzonatate (TESSALON) 100 MG capsule; Take 1-2 capsules (100-200 mg total) by mouth 3 (three) times daily as needed for cough.  Dispense: 40 capsule; Refill: 0  - follow up in 4-6 weeks and check symptoms and PFTs, RTC sooner if fever, worsening symptoms, purulent or bloody sputum.   Clarene Reamer, FNP-BC  [facility]  02/12/2015 9:15 AM

## 2015-02-23 ENCOUNTER — Other Ambulatory Visit: Payer: Self-pay | Admitting: Physician Assistant

## 2015-02-24 ENCOUNTER — Telehealth: Payer: Self-pay | Admitting: Family Medicine

## 2015-02-24 MED ORDER — ALBUTEROL SULFATE HFA 108 (90 BASE) MCG/ACT IN AERS: 2.0000 | INHALATION_SPRAY | Freq: Four times a day (QID) | RESPIRATORY_TRACT | Status: DC | PRN

## 2015-02-24 NOTE — Telephone Encounter (Signed)
Has been using albuterol inhaler a couple times a day, is out. Cough has been getting worse. Some relief with tessalon pearls. No wheezing. Cough nonproductive. She was instructed to increase Qvar to 2 puffs BID. Albuterol refill sent to pharmacy. Follow up 48 hours if not better.

## 2015-02-24 NOTE — Telephone Encounter (Signed)
Patient called requesting a refill on inhaler. Patient cough is getting worse. Patient experienced coughing spell and vomited. Patient is taking Claritin as instructed. Please call patient at [phone].

## 2015-02-25 ENCOUNTER — Other Ambulatory Visit: Payer: Self-pay | Admitting: Family Medicine

## 2015-02-27 ENCOUNTER — Ambulatory Visit (INDEPENDENT_AMBULATORY_CARE_PROVIDER_SITE_OTHER): Payer: 59

## 2015-02-27 ENCOUNTER — Ambulatory Visit (INDEPENDENT_AMBULATORY_CARE_PROVIDER_SITE_OTHER): Payer: 59 | Admitting: Family Medicine

## 2015-02-27 VITALS — BP 110/72 | HR 86 | Temp 98.0°F | Resp 18 | Ht 62.0 in | Wt 168.8 lb

## 2015-02-27 DIAGNOSIS — R05 Cough: Secondary | ICD-10-CM

## 2015-02-27 DIAGNOSIS — R053 Chronic cough: Secondary | ICD-10-CM

## 2015-02-27 DIAGNOSIS — E876 Hypokalemia: Secondary | ICD-10-CM | POA: Diagnosis not present

## 2015-02-27 MED ORDER — AZITHROMYCIN 250 MG PO TABS: ORAL_TABLET | ORAL | Status: DC
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
## 2015-02-27 NOTE — Patient Instructions (Signed)
Try the antibiotic and let me know if it does not finally get rid of your cough

## 2015-02-28 LAB — BASIC METABOLIC PANEL
BUN: 11 mg/dL (ref 7–25)
CO2: 27 mmol/L (ref 20–31)
Calcium: 9.1 mg/dL (ref 8.6–10.2)
Chloride: 102 mmol/L (ref 98–110)
Creat: 0.81 mg/dL (ref 0.50–1.10)
GLUCOSE: 94 mg/dL (ref 65–99)
Potassium: 4.7 mmol/L (ref 3.5–5.3)
Sodium: 136 mmol/L (ref 135–146)

## 2015-03-03 ENCOUNTER — Telehealth: Payer: Self-pay

## 2015-03-03 ENCOUNTER — Telehealth: Payer: Self-pay | Admitting: Family Medicine

## 2015-03-03 ENCOUNTER — Ambulatory Visit (INDEPENDENT_AMBULATORY_CARE_PROVIDER_SITE_OTHER): Payer: 59 | Admitting: Internal Medicine

## 2015-03-03 VITALS — BP 120/76 | HR 84 | Temp 98.1°F | Resp 18 | Ht 62.0 in

## 2015-03-03 DIAGNOSIS — B370 Candidal stomatitis: Secondary | ICD-10-CM | POA: Diagnosis not present

## 2015-03-03 LAB — POCT SKIN KOH: Skin KOH, POC: POSITIVE

## 2015-03-03 NOTE — Telephone Encounter (Signed)
Called but no answer.  LMOM- what does she mean by allergic reaction?  If swelling, hives, SOB please seek emergency care.  If this means that she threw up, just stop taking it.  It sounds like she got 3 days of the azithromcycin which may be enough.  We are glad to re-evaluate her in the office at her convenience.

## 2015-03-03 NOTE — Telephone Encounter (Signed)
Spoke with pt, she would like something for the cough. She states her lip is turning white at the bottom and spreading. I advised her to come in tonight to be seen. SHe agreed.

## 2015-03-03 NOTE — Telephone Encounter (Signed)
Patient is having a allergic reaction to the Z-pack. Patient is experiencing white residue around her lips. Patient coughed all night and caused her to throw up. Patient want to know if she should continue the last dosage of the medication. Patient is very concerned. Please call the patient at [phone].

## 2015-03-03 NOTE — Telephone Encounter (Signed)
Patient called stated she is having a allergic reaction to the Z-Pack. She didn't take the medication yesterday. Coughed through the night and threw up. Inside of her lip has white stuff. Please call the patient at [phone]

## 2015-03-03 NOTE — Progress Notes (Signed)
   Subjective:    Patient ID: Jennifer Dean, female    DOB: 12-Feb-1981, 35 y.o.   MRN: [id number] This chart was scribed for Tami Lin, MD by Marti Sleigh, Medical Scribe. This patient was seen in Room 11 and the patient's care was started a 8:12 PM.  Chief Complaint  Patient presents with  . Follow-up    possible reaction to z-pack. Noticed some whiteness inside of lips & feels irritated x 2-3 days. No other sx's    HPI HPI Comments: Jennifer Dean is a 34 y.o. female who presents to [facility] complaining of continued cough for the last four months, as well as a possible reaction to medication. She has a spreading white patch on her tongue and lip that started a couple of weeks after she started her current medications. She is taking q-var, tessalon pearls and a z-pac. She had a clear chest x-ray one week ago. She denies indigestion, trouble breathing through her nose at night. She denies wheezing. She states she has some mild SOB with exertion.    Review of Systems  Constitutional: Negative for fever and chills.  HENT: Negative for congestion and postnasal drip.   Respiratory: Positive for cough, shortness of breath and wheezing.   Cardiovascular: Negative for chest pain and palpitations.       Objective:  BP 120/76 mmHg  Pulse 84  Temp(Src) 98.1 F (36.7 C) (Oral)  Resp 18  Ht 5\' 2"  (1.575 m)  SpO2 97%  LMP 02/17/2015  Physical Exam  Constitutional: She appears well-developed and well-nourished. No distress.  HENT:  Head: Normocephalic and atraumatic.  White lacy exudate on both inner lips Scrapes off easily and micro shows multiple hyphae with buds  Eyes: Pupils are equal, round, and reactive to light.  Neck: Neck supple.  Cardiovascular: Normal rate and regular rhythm.   Pulmonary/Chest: Effort normal.  Minimal wheeze with forced expir  Lymphadenopathy:    She has no cervical adenopathy.  Skin: She is not diaphoretic.  Nursing note and vitals  reviewed.     Assessment & Plan:  Thrush -?Qvar Vs Zith D/c zith Brush tongue and lips after qvar Diflucan 10d --200 day1 and 100 9 more days written  Cough due to RAD following sinusitis??    I have completed the patient encounter in its entirety as documented by the scribe, with editing by me where necessary. Kelita Wallis P. Laney Pastor, M.D. By signing my name below, I, Judithe Modest, attest that this documentation has been prepared under the direction and in the presence of Tami Lin, MD. Electronically Signed: Judithe Modest, ER Scribe. 03/03/2015. 8:12 PM.

## 2015-03-06 ENCOUNTER — Other Ambulatory Visit: Payer: Self-pay | Admitting: Family Medicine

## 2015-03-06 NOTE — Telephone Encounter (Signed)
Patient request a refill of Tessalon 100 MG. [facility] outpatient on [address].

## 2015-03-09 NOTE — Telephone Encounter (Signed)
Debbie, do you want to RF this, or have pt RTC?

## 2015-04-07 ENCOUNTER — Telehealth: Payer: Self-pay | Admitting: Family Medicine

## 2015-04-07 DIAGNOSIS — J4521 Mild intermittent asthma with (acute) exacerbation: Secondary | ICD-10-CM

## 2015-04-07 NOTE — Telephone Encounter (Signed)
Patient request a refill of Q-Var.

## 2015-04-07 NOTE — Telephone Encounter (Signed)
beclomethasone (QVAR) 40 MCG/ACT inhaler [id number]      Order Details    Dose: 1 puff Route: Inhalation Frequency: 2 times daily   Dispense Quantity:  1 Inhaler Refills:  12 Fills Remaining:  12          Sig: Inhale 1 puff into the lungs 2 (two) times daily.         Written Date:  02/10/15 Expiration Date:  02/10/16     Start Date:  02/10/15 End Date:  --     Ordering Provider:  -- Authorizing Provider:  Elby Beck, FNP Ordering User:  Elby Beck, FNP         She has refills.

## 2015-04-08 MED ORDER — FLUCONAZOLE 150 MG PO TABS: 150.0000 mg | ORAL_TABLET | Freq: Once | ORAL | Status: DC

## 2015-04-08 MED FILL — QVAR 40 MCG ORAL INHALER: 40 | 30 days supply | Qty: 9 | Fill #1

## 2015-04-08 MED FILL — FLUCONAZOLE 150 MG TABLET: 150 | 21 days supply | Qty: 3 | Fill #0

## 2015-04-08 NOTE — Telephone Encounter (Signed)
I called Jennifer Dean, she has refills. Spoke with pt, she needs a refill on Diflucan. Can we refill?

## 2015-04-08 NOTE — Telephone Encounter (Signed)
Advised pt

## 2015-04-08 NOTE — Telephone Encounter (Signed)
Should be able to treat a different way to end this 150 weekly for 3 weeks

## 2015-04-21 MED FILL — ESCITALOPRAM 10 MG TABLET: 10 | 30 days supply | Qty: 30 | Fill #5

## 2015-04-22 MED FILL — VENTOLIN HFA 90 MCG INHALER: 108 (90 BAS | 25 days supply | Qty: 18 | Fill #1

## 2015-04-27 DIAGNOSIS — J301 Allergic rhinitis due to pollen: Secondary | ICD-10-CM | POA: Diagnosis not present

## 2015-04-27 DIAGNOSIS — F329 Major depressive disorder, single episode, unspecified: Secondary | ICD-10-CM | POA: Diagnosis not present

## 2015-04-27 DIAGNOSIS — J4520 Mild intermittent asthma, uncomplicated: Secondary | ICD-10-CM | POA: Diagnosis not present

## 2015-04-27 DIAGNOSIS — Z3041 Encounter for surveillance of contraceptive pills: Secondary | ICD-10-CM | POA: Diagnosis not present

## 2015-05-18 MED FILL — TRI-LO-ESTARYLLA TABLET: 0.18/0.215/ | 84 days supply | Qty: 84 | Fill #4

## 2015-05-20 ENCOUNTER — Telehealth (INDEPENDENT_AMBULATORY_CARE_PROVIDER_SITE_OTHER): Payer: Self-pay | Admitting: Internal Medicine

## 2015-05-20 DIAGNOSIS — J0120 Acute ethmoidal sinusitis, unspecified: Secondary | ICD-10-CM

## 2015-05-20 MED ORDER — LEVOFLOXACIN 500 MG PO TABS: 500.0000 mg | ORAL_TABLET | Freq: Every day | ORAL | Status: DC

## 2015-05-20 MED FILL — levoFLOXacin 500 MG TABS: 500 | 10 days supply | Qty: 10 | Fill #0

## 2015-05-20 NOTE — Telephone Encounter (Signed)
Rx for Levaquin sent to her pharmacy

## 2015-05-22 MED FILL — ESCITALOPRAM 10 MG TABLET: 10 | 30 days supply | Qty: 30 | Fill #6

## 2015-05-25 DIAGNOSIS — K219 Gastro-esophageal reflux disease without esophagitis: Secondary | ICD-10-CM | POA: Diagnosis not present

## 2015-05-25 DIAGNOSIS — J4520 Mild intermittent asthma, uncomplicated: Secondary | ICD-10-CM | POA: Diagnosis not present

## 2015-05-25 DIAGNOSIS — J301 Allergic rhinitis due to pollen: Secondary | ICD-10-CM | POA: Diagnosis not present

## 2015-05-25 DIAGNOSIS — F329 Major depressive disorder, single episode, unspecified: Secondary | ICD-10-CM | POA: Diagnosis not present

## 2015-05-25 MED FILL — FLUTICASONE PROP 50 MCG SPR: 50 | 30 days supply | Qty: 16 | Fill #0

## 2015-05-25 MED FILL — MONTELUKAST SOD 10 MG TAB: 10 | 30 days supply | Qty: 30 | Fill #0

## 2015-06-05 DIAGNOSIS — J029 Acute pharyngitis, unspecified: Secondary | ICD-10-CM | POA: Diagnosis not present

## 2015-06-22 MED FILL — ESCITALOPRAM 10 MG TABLET: 10 | 30 days supply | Qty: 30 | Fill #7

## 2015-07-03 DIAGNOSIS — J4520 Mild intermittent asthma, uncomplicated: Secondary | ICD-10-CM | POA: Diagnosis not present

## 2015-07-03 DIAGNOSIS — R05 Cough: Secondary | ICD-10-CM | POA: Diagnosis not present

## 2015-07-03 DIAGNOSIS — J301 Allergic rhinitis due to pollen: Secondary | ICD-10-CM | POA: Diagnosis not present

## 2015-07-03 MED FILL — FLUTICASONE PROP 50 MCG SPR: 50 | 30 days supply | Qty: 16 | Fill #1

## 2015-07-03 MED FILL — predniSONE 20 MG TABS: 20 | 7 days supply | Qty: 10 | Fill #0

## 2015-07-07 MED FILL — AZELASTINE HCL 137 MCG SPRY: 0.1 | 30 days supply | Qty: 30 | Fill #0

## 2015-07-10 MED FILL — BENZONATATE 200 MG CAPSULE: 200 | 7 days supply | Qty: 20 | Fill #0

## 2015-07-10 MED FILL — predniSONE 10 MG (48) TBPK: 10 | 6 days supply | Qty: 48 | Fill #0

## 2015-07-15 ENCOUNTER — Ambulatory Visit (INDEPENDENT_AMBULATORY_CARE_PROVIDER_SITE_OTHER): Payer: 59 | Admitting: Allergy and Immunology

## 2015-07-15 ENCOUNTER — Encounter: Payer: Self-pay | Admitting: Allergy and Immunology

## 2015-07-15 VITALS — BP 122/86 | HR 76 | Temp 98.3°F | Resp 16 | Ht 62.5 in | Wt 172.0 lb

## 2015-07-15 DIAGNOSIS — R053 Chronic cough: Secondary | ICD-10-CM | POA: Insufficient documentation

## 2015-07-15 DIAGNOSIS — J4540 Moderate persistent asthma, uncomplicated: Secondary | ICD-10-CM

## 2015-07-15 DIAGNOSIS — R05 Cough: Secondary | ICD-10-CM

## 2015-07-15 DIAGNOSIS — J3089 Other allergic rhinitis: Secondary | ICD-10-CM

## 2015-07-15 MED ORDER — BECLOMETHASONE DIPROPIONATE 80 MCG/ACT NA AERS: 1.0000 | INHALATION_SPRAY | Freq: Two times a day (BID) | NASAL | Status: DC

## 2015-07-15 MED ORDER — BUDESONIDE-FORMOTEROL FUMARATE 160-4.5 MCG/ACT IN AERO: 2.0000 | INHALATION_SPRAY | Freq: Two times a day (BID) | RESPIRATORY_TRACT | Status: DC

## 2015-07-15 MED ORDER — AEROCHAMBER PLUS MISC: Status: DC

## 2015-07-15 MED FILL — AEROCHAMBER: 30 days supply | Qty: 1 | Fill #0

## 2015-07-15 MED FILL — QNASL 80 MCG NASAL SPRAY: 80 | 30 days supply | Qty: 9 | Fill #0

## 2015-07-15 MED FILL — SYMBICORT 160-4.5 MCG INH: 160-4.5 | 30 days supply | Qty: 10 | Fill #0

## 2015-07-15 NOTE — Progress Notes (Signed)
New Patient Note  RE: Jennifer Dean MRN: [id number] DOB: [birth year] Date of Office Visit: 07/15/2015  Referring provider: Leamon Arnt, MD Primary care provider: Leamon Arnt, MD  Chief Complaint: Cough   History of present illness: HPI Comments: Jennifer Dean is a 35 y.o. female presenting today for consultation of a persistent cough.  She reports that she has suffered from a persistent cough for the past 9 months.  Cough is described as productive, occasionally resulting in posttussive emesis.  She states the cough seems to originate as a tickle in the base of the throat.  She denies wheezing, dyspnea, or chest tightness.  She experiences persistent thick postnasal drainage, throat irritation, and hoarseness, as well as occasional nasal congestion and sinus pressure. No significant seasonal symptom variation has been noted nor have specific environmental triggers been identified.  Over the past 2-4 weeks she has been started on azelastine nasal spray, montelukast, Nexium, and restarted fluticasone nasal spray.  Despite compliance with these medications, symptoms have persisted to some degree and she only experiences complete relief with prednisone.   Assessment and plan: Perennial and seasonal allergic rhinitis  Aeroallergen avoidance measures have been discussed and provided in written form.  A sample and prescription have been provided for Qnasl 80 g, one actuation per nostril twice daily as needed.  Proper technique has been discussed and demonstrated.  Discontinue fluticasone nasal spray.  Continue azelastine, one spray per nostril twice a day.  Nasal saline lavage (NeilMed) as needed has been recommended along with instructions for proper administration.  Guaifenesin 1200 mg twice daily as needed with adequate hydration as discussed.   If allergen avoidance measures and medications fail to adequately relieve symptoms, aeroallergen immunotherapy will be  considered.  Asthma Asthma/cough variant asthma.  Today's spirometry results, assessed while asymptomatic, suggest under-perception of bronchoconstriction.  A prescription has been provided for Symbicort (budesonide/formoterol) 160/4.5 g, 2 inhalations twice a day.  To maximize pulmonary deposition, a spacer has been provided along with instructions for its proper administration with an HFA inhaler.  For now, continue montelukast 10 mg daily at bedtime and albuterol every 4-6 hours as needed.  Subjective and objective measures of pulmonary function will be followed and the treatment plan will be adjusted accordingly.  Persistent cough The most common causes of chronic cough include the following: upper airway cough syndrome (UACS) which is caused by variety of rhinosinus conditions; asthma; gastroesophageal reflux disease (GERD); chronic bronchitis from cigarette smoking or other inhaled environmental irritants; non-asthmatic eosinophilic bronchitis; and bronchiectasis. In prospective studies, these conditions have accounted for up to 94% of the causes of chronic cough in immunocompetent adults. The history and physical examination suggest that her cough is multifactorial with contribution from postnasal drainage and bronchial hyperresponsiveness. We will address these issues at this time.   Treatment plan as outlined above.    For now, discontinue fexofenadine as this medication may contribute to mucous viscosity.  For now, discontinue esomeprazole.  We will regroup in 6 weeks to assess treatment response and adjust therapy accordingly.    Meds ordered this encounter  Medications  . budesonide-formoterol (SYMBICORT) 160-4.5 MCG/ACT inhaler    Sig: Inhale 2 puffs into the lungs 2 (two) times daily.    Dispense:  1 Inhaler    Refill:  5  . Beclomethasone Dipropionate (QNASL) 80 MCG/ACT AERS    Sig: Place 1 spray into the nose 2 (two) times daily.    Dispense:  8.7 g    Refill:  5    . Spacer/Aero-Holding Chambers (AEROCHAMBER PLUS) inhaler    Sig: Use as directed with MDI    Dispense:  1 each    Refill:  1    Diagnositics: Spirometry: FVC is 2.43 L (79% predicted) and FEV1 is 1.77 L (69% predicted) with significant (330 mL, 19%) postbronchodilator improvement. Allergy skin testing: Positive to grass pollen, weed pollen, tree pollen, mold, cat hair, dog epithelia, dust mite.    Physical examination: Blood pressure 122/86, pulse 76, temperature 98.3 F (36.8 C), temperature source Oral, resp. rate 16, height 5' 2.5" (1.588 m), weight 171 lb 15.3 oz (78 kg).  General: Alert, interactive, in no acute distress. HEENT: TMs pearly gray, turbinates edematous with thick discharge, post-pharynx erythematous. Neck: Supple without lymphadenopathy. Lungs: Mildly decreased breath sounds bilaterally without wheezing, rhonchi or rales. CV: Normal S1, S2 without murmurs. Abdomen: Nondistended, nontender. Skin: Warm and dry, without lesions or rashes. Extremities:  No clubbing, cyanosis or edema. Neuro:   Grossly intact.  Review of systems:  Review of Systems  Constitutional: Negative for fever, chills and weight loss.  HENT: Positive for congestion. Negative for nosebleeds.   Eyes: Negative for blurred vision.  Respiratory: Positive for cough. Negative for hemoptysis.   Cardiovascular: Negative for chest pain.  Gastrointestinal: Negative for diarrhea and constipation.  Genitourinary: Negative for dysuria.  Musculoskeletal: Negative for myalgias and joint pain.  Skin: Negative for itching and rash.  Neurological: Negative for dizziness.  Endo/Heme/Allergies: Does not bruise/bleed easily.    Past medical history:  Past Medical History  Diagnosis Date  . Asthma   . Vision abnormalities   . Eczema     Past surgical history:  Past Surgical History  Procedure Laterality Date  . Wisdom tooth extraction      Family history: Family History  Problem Relation Age  of Onset  . Hypertension Mother   . GER disease Mother   . Eczema Mother   . Hypertension Father   . Post-traumatic stress disorder Father   . Allergic rhinitis Neg Hx   . Angioedema Neg Hx   . Asthma Neg Hx   . Atopy Neg Hx   . Immunodeficiency Neg Hx   . Urticaria Neg Hx     Social history: Social History   Social History  . Marital Status: Married    Spouse Name: N/A  . Number of Children: N/A  . Years of Education: N/A   Occupational History  . Not on file.   Social History Main Topics  . Smoking status: Never Smoker   . Smokeless tobacco: Not on file  . Alcohol Use: No  . Drug Use: No  . Sexual Activity: Not on file   Other Topics Concern  . Not on file   Social History Narrative   Environmental History: The patient lives in a 35 year old house with hardwood floors throughout, gas heat, and central air.  She is a nonsmoker without pets.  She is not exposed to significant secondhand cigarette smoke.    Medication List       This list is accurate as of: 07/15/15  4:16 PM.  Always use your most recent med list.               AEROCHAMBER PLUS inhaler  Use as directed with MDI     albuterol 108 (90 Base) MCG/ACT inhaler  Commonly known as:  PROVENTIL HFA;VENTOLIN HFA  Inhale 2 puffs into the lungs every 6 (six) hours as needed.  azelastine 0.1 % nasal spray  Commonly known as:  ASTELIN     Beclomethasone Dipropionate 80 MCG/ACT Aers  Commonly known as:  QNASL  Place 1 spray into the nose 2 (two) times daily.     benzonatate 100 MG capsule  Commonly known as:  TESSALON  TAKE 1-2 CAPSULES BY MOUTH 3 TIMES DAILY AS NEEDED FOR COUGH     budesonide-formoterol 160-4.5 MCG/ACT inhaler  Commonly known as:  SYMBICORT  Inhale 2 puffs into the lungs 2 (two) times daily.     escitalopram 10 MG tablet  Commonly known as:  LEXAPRO  Take 1 tablet (10 mg total) by mouth daily.     fexofenadine 180 MG tablet  Commonly known as:  ALLEGRA  Take by mouth.      fluticasone 50 MCG/ACT nasal spray  Commonly known as:  FLONASE  Place 2 sprays into both nostrils daily.     montelukast 10 MG tablet  Commonly known as:  SINGULAIR     NEXIUM 20 MG capsule  Generic drug:  esomeprazole  Take by mouth.     predniSONE 20 MG tablet  Commonly known as:  DELTASONE     tetrahydrozoline 0.05 % ophthalmic solution  Apply to eye.     TRI-LO-ESTARYLLA 0.18/0.215/0.25 MG-25 MCG tab  Generic drug:  Norgestimate-Ethinyl Estradiol Triphasic        Known medication allergies: Allergies  Allergen Reactions  . Morphine And Related Nausea And Vomiting  . Codeine Anxiety    Dry mouth, uneasy feeling    I appreciate the opportunity to take part in this Tatyanna's care. Please do not hesitate to contact me with questions.  Sincerely,   R. Edgar Frisk, MD

## 2015-07-15 NOTE — Assessment & Plan Note (Addendum)
The most common causes of chronic cough include the following: upper airway cough syndrome (UACS) which is caused by variety of rhinosinus conditions; asthma; gastroesophageal reflux disease (GERD); chronic bronchitis from cigarette smoking or other inhaled environmental irritants; non-asthmatic eosinophilic bronchitis; and bronchiectasis. In prospective studies, these conditions have accounted for up to 94% of the causes of chronic cough in immunocompetent adults. The history and physical examination suggest that her cough is multifactorial with contribution from postnasal drainage and bronchial hyperresponsiveness. We will address these issues at this time.   Treatment plan as outlined above.    For now, discontinue fexofenadine as this medication may contribute to mucous viscosity.  For now, discontinue esomeprazole.  We will regroup in 6 weeks to assess treatment response and adjust therapy accordingly.

## 2015-07-15 NOTE — Patient Instructions (Addendum)
Perennial and seasonal allergic rhinitis  Aeroallergen avoidance measures have been discussed and provided in written form.  A sample and prescription have been provided for Qnasl 80 g, one actuation per nostril twice daily as needed.  Proper technique has been discussed and demonstrated.  Discontinue fluticasone nasal spray.  Continue azelastine, one spray per nostril twice a day.  Nasal saline lavage (NeilMed) as needed has been recommended along with instructions for proper administration.  Guaifenesin 1200 mg twice daily as needed with adequate hydration as discussed.   If allergen avoidance measures and medications fail to adequately relieve symptoms, aeroallergen immunotherapy will be considered.  Asthma Asthma/cough variant asthma.  Today's spirometry results, assessed while asymptomatic, suggest under-perception of bronchoconstriction.  A prescription has been provided for Symbicort (budesonide/formoterol) 160/4.5 g, 2 inhalations twice a day.  To maximize pulmonary deposition, a spacer has been provided along with instructions for its proper administration with an HFA inhaler.  For now, continue montelukast 10 mg daily at bedtime and albuterol every 4-6 hours as needed.  Subjective and objective measures of pulmonary function will be followed and the treatment plan will be adjusted accordingly.  Persistent cough The most common causes of chronic cough include the following: upper airway cough syndrome (UACS) which is caused by variety of rhinosinus conditions; asthma; gastroesophageal reflux disease (GERD); chronic bronchitis from cigarette smoking or other inhaled environmental irritants; non-asthmatic eosinophilic bronchitis; and bronchiectasis. In prospective studies, these conditions have accounted for up to 94% of the causes of chronic cough in immunocompetent adults. The history and physical examination suggest that her cough is multifactorial with contribution from  postnasal drainage and bronchial hyperresponsiveness. We will address these issues at this time.   Treatment plan as outlined above.    For now, discontinue fexofenadine as this medication may contribute to mucous viscosity.  For now, discontinue esomeprazole.  We will regroup in 6 weeks to assess treatment response and adjust therapy accordingly.    Return in about 6 weeks (around 08/26/2015), or if symptoms worsen or fail to improve.  Reducing Pollen Exposure  The American Academy of Allergy, Asthma and Immunology suggests the following steps to reduce your exposure to pollen during allergy seasons.    1. Do not hang sheets or clothing out to dry; pollen may collect on these items. 2. Do not mow lawns or spend time around freshly cut grass; mowing stirs up pollen. 3. Keep windows closed at night.  Keep car windows closed while driving. 4. Minimize morning activities outdoors, a time when pollen counts are usually at their highest. 5. Stay indoors as much as possible when pollen counts or humidity is high and on windy days when pollen tends to remain in the air longer. 6. Use air conditioning when possible.  Many air conditioners have filters that trap the pollen spores. 7. Use a HEPA room air filter to remove pollen form the indoor air you breathe.   Control of House Dust Mite Allergen  House dust mites play a major role in allergic asthma and rhinitis.  They occur in environments with high humidity wherever human skin, the food for dust mites is found. High levels have been detected in dust obtained from mattresses, pillows, carpets, upholstered furniture, bed covers, clothes and soft toys.  The principal allergen of the house dust mite is found in its feces.  A gram of dust may contain 1,000 mites and 250,000 fecal particles.  Mite antigen is easily measured in the air during house cleaning activities.  1. Encase mattresses, including the box spring, and pillow, in an air tight  cover.  Seal the zipper end of the encased mattresses with wide adhesive tape. 2. Wash the bedding in water of 130 degrees Farenheit weekly.  Avoid cotton comforters/quilts and flannel bedding: the most ideal bed covering is the dacron comforter. 3. Remove all upholstered furniture from the bedroom. 4. Remove carpets, carpet padding, rugs, and non-washable window drapes from the bedroom.  Wash drapes weekly or use plastic window coverings. 5. Remove all non-washable stuffed toys from the bedroom.  Wash stuffed toys weekly. 6. Have the room cleaned frequently with a vacuum cleaner and a damp dust-mop.  The patient should not be in a room which is being cleaned and should wait 1 hour after cleaning before going into the room. 7. Close and seal all heating outlets in the bedroom.  Otherwise, the room will become filled with dust-laden air.  An electric heater can be used to heat the room. Reduce indoor humidity to less than 50%.  Do not use a humidifier.  Control of Dog or Cat Allergen  Avoidance is the best way to manage a dog or cat allergy. If you have a dog or cat and are allergic to dog or cats, consider removing the dog or cat from the home. If you have a dog or cat but don't want to find it a new home, or if your family wants a pet even though someone in the household is allergic, here are some strategies that may help keep symptoms at bay:  1. Keep the pet out of your bedroom and restrict it to only a few rooms. Be advised that keeping the dog or cat in only one room will not limit the allergens to that room. 2. Don't pet, hug or kiss the dog or cat; if you do, wash your hands with soap and water. 3. High-efficiency particulate air (HEPA) cleaners run continuously in a bedroom or living room can reduce allergen levels over time. 4. Regular use of a high-efficiency vacuum cleaner or a central vacuum can reduce allergen levels. 5. Giving your dog or cat a bath at least once a week can reduce  airborne allergen.  Control of Mold Allergen  Mold and fungi can grow on a variety of surfaces provided certain temperature and moisture conditions exist.  Outdoor molds grow on plants, decaying vegetation and soil.  The major outdoor mold, Alternaria and Cladosporium, are found in very high numbers during hot and dry conditions.  Generally, a late Summer - Fall peak is seen for common outdoor fungal spores.  Rain will temporarily lower outdoor mold spore count, but counts rise rapidly when the rainy period ends.  The most important indoor molds are Aspergillus and Penicillium.  Dark, humid and poorly ventilated basements are ideal sites for mold growth.  The next most common sites of mold growth are the bathroom and the kitchen.  Outdoor [employer] 1. Use air conditioning and keep windows closed 2. Avoid exposure to decaying vegetation. 3. Avoid leaf raking. 4. Avoid grain handling. 5. Consider wearing a face mask if working in moldy areas.  Indoor Mold Control 1. Maintain humidity below 50%. 2. Clean washable surfaces with 5% bleach solution. 3. Remove sources e.g. Contaminated carpets.

## 2015-07-15 NOTE — Assessment & Plan Note (Addendum)
   Aeroallergen avoidance measures have been discussed and provided in written form.  A sample and prescription have been provided for Qnasl 80 g, one actuation per nostril twice daily as needed.  Proper technique has been discussed and demonstrated.  Discontinue fluticasone nasal spray.  Continue azelastine, one spray per nostril twice a day.  Nasal saline lavage (NeilMed) as needed has been recommended along with instructions for proper administration.  Guaifenesin 1200 mg twice daily as needed with adequate hydration as discussed.   If allergen avoidance measures and medications fail to adequately relieve symptoms, aeroallergen immunotherapy will be considered.

## 2015-07-15 NOTE — Assessment & Plan Note (Addendum)
Asthma/cough variant asthma.  Today's spirometry results, assessed while asymptomatic, suggest under-perception of bronchoconstriction.  A prescription has been provided for Symbicort (budesonide/formoterol) 160/4.5 g, 2 inhalations twice a day.  To maximize pulmonary deposition, a spacer has been provided along with instructions for its proper administration with an HFA inhaler.  For now, continue montelukast 10 mg daily at bedtime and albuterol every 4-6 hours as needed.  Subjective and objective measures of pulmonary function will be followed and the treatment plan will be adjusted accordingly.

## 2015-07-20 ENCOUNTER — Telehealth: Payer: Self-pay

## 2015-07-20 NOTE — Telephone Encounter (Signed)
PLEASE ADVISE.

## 2015-07-20 NOTE — Telephone Encounter (Signed)
DOL VISIT 07/15/2015 [place]. Pt called the on call # this weekend and received Dr. Ishmael Holter. She felt like something in her throat was swelling and she had to use her inhaler multiple X's. She didn't feel like she couldn't breath just very soar and swollen. She did everything Dr. Ishmael Holter recommend, things have gotten some what better, The only big issue now is she is losing her voice.  Please Advise  Thanks

## 2015-07-20 NOTE — Telephone Encounter (Signed)
Try mucinex 1200 mg bid with adequate hydration (to thin mucus drainage) and nasal saline lavage 2-3 times per day. Thanks.

## 2015-07-21 NOTE — Telephone Encounter (Signed)
Informed patient to continue Mucinex and do her nasal saline lavages. She states she is feeling better today.

## 2015-07-22 ENCOUNTER — Encounter: Payer: Self-pay | Admitting: Allergy and Immunology

## 2015-07-22 MED FILL — ESCITALOPRAM 10 MG TABLET: 10 | 30 days supply | Qty: 30 | Fill #8

## 2015-07-23 ENCOUNTER — Encounter: Payer: Self-pay | Admitting: Allergy and Immunology

## 2015-07-23 ENCOUNTER — Telehealth: Payer: Self-pay

## 2015-07-23 NOTE — Telephone Encounter (Signed)
Pt send the following message thru pt advice box. Pleas advise   I'm taking Symbicort twice a day (two puffs) and I'm using the (clear plastic thing in between the inhaler and my mouth...forgot what it's called) but I think I'm still developing thrush on the inside of my lips. Is this possible? Please advise.

## 2015-07-23 NOTE — Telephone Encounter (Signed)
SPOKE WITH PT, SHE IS GOING TO TRY WHAT DR BOBBITS HAD RECOMMED AND WILL LET us KNOW HOW SHE IS DOING.  SHE IS IN A [place] Saturday AND IS HOPING IT IS GONE BY THEN.

## 2015-07-23 NOTE — Telephone Encounter (Signed)
If she is using the spacer correctly and rinsing her mouth carefully after each use of Symbicort, thrush is less likely.  Please review with the patient proper spacer technique and oral hygiene.  However, if the problem persists or progresses, I will call in a prescription to treat thrush.

## 2015-07-30 MED FILL — MONTELUKAST SOD 10 MG TAB: 10 | 30 days supply | Qty: 30 | Fill #1

## 2015-08-03 ENCOUNTER — Encounter: Payer: Self-pay | Admitting: Allergy and Immunology

## 2015-08-05 ENCOUNTER — Encounter: Payer: Self-pay | Admitting: Allergy and Immunology

## 2015-08-05 ENCOUNTER — Ambulatory Visit (INDEPENDENT_AMBULATORY_CARE_PROVIDER_SITE_OTHER): Payer: 59 | Admitting: Allergy and Immunology

## 2015-08-05 VITALS — BP 118/70 | HR 76 | Temp 98.1°F | Resp 20

## 2015-08-05 DIAGNOSIS — R05 Cough: Secondary | ICD-10-CM

## 2015-08-05 DIAGNOSIS — J4541 Moderate persistent asthma with (acute) exacerbation: Secondary | ICD-10-CM | POA: Diagnosis not present

## 2015-08-05 DIAGNOSIS — R053 Chronic cough: Secondary | ICD-10-CM

## 2015-08-05 DIAGNOSIS — J3089 Other allergic rhinitis: Secondary | ICD-10-CM

## 2015-08-05 DIAGNOSIS — T7840XA Allergy, unspecified, initial encounter: Secondary | ICD-10-CM | POA: Diagnosis not present

## 2015-08-05 MED ORDER — DEXLANSOPRAZOLE 60 MG PO CPDR: 60.0000 mg | DELAYED_RELEASE_CAPSULE | Freq: Every day | ORAL | Status: DC

## 2015-08-05 MED ORDER — PREDNISONE 1 MG PO TABS: 10.0000 mg | ORAL_TABLET | ORAL | Status: DC

## 2015-08-05 MED ORDER — MOMETASONE FURO-FORMOTEROL FUM 200-5 MCG/ACT IN AERO: 2.0000 | INHALATION_SPRAY | Freq: Two times a day (BID) | RESPIRATORY_TRACT | Status: DC

## 2015-08-05 MED FILL — DULERA 200 MCG/5 MCG INH: 200-5 | 30 days supply | Qty: 13 | Fill #0

## 2015-08-05 NOTE — Assessment & Plan Note (Addendum)
Asthma/cough variant asthma with exacerbation.  Spirometry today revealed 180 mL reversibility indicative of bronchial hyperresponsiveness.  Prednisone has been provided, 20 mg x 4 days, 10 mg x1 day, then stop.  A sample and prescription have been provided for [facility] 200/5, 2 inhalations via spacer device twice a day.  Discontinue Symbicort.  Continue montelukast 10 mg daily at bedtime and albuterol every 4-6 hours as needed.

## 2015-08-05 NOTE — Assessment & Plan Note (Signed)
Multifactorial.  Based upon persistence of symptoms despite addressing postnasal drainage and bronchial hyperresponsiveness we will add a therapeutic trial of proton pump inhibitor in case silent reflux is contributing.  Prednisone has been provided (as above).  Treatment plan as outlined above.  Appropriate reflux-like some modifications have been provided.  A prescription has been provided for Dexilant 60 mg daily 30 minutes prior to meals for the next 6 weeks. If symptoms improve we will decrease dose to 30 mg daily.

## 2015-08-05 NOTE — Assessment & Plan Note (Signed)
   For now, continue appropriate allergen avoidance measures, azelastine nasal spray, Qnasl, guaifenesin 1200 mg twice a day as needed, and nasal saline irrigation.

## 2015-08-05 NOTE — Assessment & Plan Note (Signed)
The patient's symptom constellation suggests allergic reaction.  Unclear etiology.  The symptoms may merely be from severe pollen allergies irritating the upper and lower respiratory tracts.  However, we will rule out other potential etiologies with lab work.  The following labs have been ordered: Baseline serum tryptase, C4, and galactose-alpha-1,3-galactose.   Should symptoms recur, the patient has been asked to keep a journal to record any foods eaten, beverages consumed, medications taken within a 6 hour period prior to the onset of symptoms, as well as record activities being performed, and environmental conditions. For any symptoms concerning for anaphylaxis, 911 is to be called immediately.

## 2015-08-05 NOTE — Patient Instructions (Addendum)
Allergic reaction The patient's symptom constellation suggests allergic reaction.  Unclear etiology.  The symptoms may merely be from severe pollen allergies irritating the upper and lower respiratory tracts.  However, we will rule out other potential etiologies with lab work.  The following labs have been ordered: Baseline serum tryptase, C4, and galactose-alpha-1,3-galactose.   Should symptoms recur, the patient has been asked to keep a journal to record any foods eaten, beverages consumed, medications taken within a 6 hour period prior to the onset of symptoms, as well as record activities being performed, and environmental conditions. For any symptoms concerning for anaphylaxis, 911 is to be called immediately.  Asthma Asthma/cough variant asthma with exacerbation.  Spirometry today revealed 180 mL reversibility indicative of bronchial hyperresponsiveness.  Prednisone has been provided, 20 mg x 4 days, 10 mg x1 day, then stop.  A sample and prescription have been provided for [facility] 200/5, 2 inhalations via spacer device twice a day.  Discontinue Symbicort.  Continue montelukast 10 mg daily at bedtime and albuterol every 4-6 hours as needed.  Perennial and seasonal allergic rhinitis  For now, continue appropriate allergen avoidance measures, azelastine nasal spray, Qnasl, guaifenesin 1200 mg twice a day as needed, and nasal saline irrigation.  Persistent cough Multifactorial.  Based upon persistence of symptoms despite addressing postnasal drainage and bronchial hyperresponsiveness we will add a therapeutic trial of proton pump inhibitor in case silent reflux is contributing.  Prednisone has been provided (as above).  Treatment plan as outlined above.  Appropriate reflux-like some modifications have been provided.  A prescription has been provided for Dexilant 60 mg daily 30 minutes prior to meals for the next 6 weeks. If symptoms improve we will decrease dose to 30 mg  daily.    Return in about 6 weeks (around 09/16/2015).  Lifestyle Changes for Controlling GERD  When you have GERD, stomach acid feels as if it's backing up toward your mouth. Whether or not you take medication to control your GERD, your symptoms can often be improved with lifestyle changes.   Raise Your Head  Reflux is more likely to strike when you're lying down flat, because stomach fluid can  flow backward more easily. Raising the head of your bed 4-6 inches can help. To do this:  Slide blocks or books under the legs at the head of your bed. Or, place a wedge under  the mattress. Many foam stores can make a suitable wedge for you. The wedge  should run from your waist to the top of your head.  Don't just prop your head on several pillows. This increases pressure on your  stomach. It can make GERD worse.  Watch Your Eating Habits Certain foods may increase the acid in your stomach or relax the lower esophageal sphincter, making GERD more likely. It's best to avoid the following:  Coffee, tea, and carbonated drinks (with and without caffeine)  Fatty, fried, or spicy food  Mint, chocolate, onions, and tomatoes  Any other foods that seem to irritate your stomach or cause you pain  Relieve the Pressure  Eat smaller meals, even if you have to eat more often.  Don't lie down right after you eat. Wait a few hours for your stomach to empty.  Avoid tight belts and tight-fitting clothes.  Lose excess weight.  Tobacco and Alcohol  Avoid smoking tobacco and drinking alcohol. They can make GERD symptoms worse.

## 2015-08-05 NOTE — Progress Notes (Signed)
Follow-up Note  RE: SA FEHRMAN MRN: [id number] DOB: Aug 02, 1980 Date of Office Visit: 08/05/2015  Primary care provider: Leamon Arnt, MD Referring provider: Leamon Arnt, MD  History of present illness: HPI Comments: Jennifer Dean is a 35 y.o. female with allergic rhinitis, persistent asthma, and persistent cough who presents today for sick visit.  She was last seen in this clinic on 07/15/2015. She reports that she has been using symbi with spacer bid and singulair and mucinex bid. She reports that her cough resolved for approximately 2 weeks after her initial visit. However, she has had 2 symptomatic episodes including sensation of throat swelling, shallow breathing, coughing, and frontal sinus pressure.  These episodes have lasted between 1 and 2 days.  First episode occurred 2 weeks ago after she had cleaned her house with chemicals and then drove through car wash.  The second episode occurred approximately 5 days ago.  After the second episode she complains that the cough has persisted, occasionally to the point of posttussive emesis.  She reports that she has been compliant with all of the medications prescribed during her initial visit.   Assessment and plan: Allergic reaction The patient's symptom constellation suggests allergic reaction.  Unclear etiology.  The symptoms may merely be from severe pollen allergies irritating the upper and lower respiratory tracts.  However, we will rule out other potential etiologies with lab work.  The following labs have been ordered: Baseline serum tryptase, C4, and galactose-alpha-1,3-galactose.   Should symptoms recur, the patient has been asked to keep a journal to record any foods eaten, beverages consumed, medications taken within a 6 hour period prior to the onset of symptoms, as well as record activities being performed, and environmental conditions. For any symptoms concerning for anaphylaxis, 911 is to be called  immediately.  Asthma Asthma/cough variant asthma with exacerbation.  Spirometry today revealed 180 mL reversibility indicative of bronchial hyperresponsiveness.  Prednisone has been provided, 20 mg x 4 days, 10 mg x1 day, then stop.  A sample and prescription have been provided for [facility] 200/5, 2 inhalations via spacer device twice a day.  Discontinue Symbicort.  Continue montelukast 10 mg daily at bedtime and albuterol every 4-6 hours as needed.  Perennial and seasonal allergic rhinitis  For now, continue appropriate allergen avoidance measures, azelastine nasal spray, Qnasl, guaifenesin 1200 mg twice a day as needed, and nasal saline irrigation.  Persistent cough Multifactorial.  Based upon persistence of symptoms despite addressing postnasal drainage and bronchial hyperresponsiveness we will add a therapeutic trial of proton pump inhibitor in case silent reflux is contributing.  Prednisone has been provided (as above).  Treatment plan as outlined above.  Appropriate reflux-like some modifications have been provided.  A prescription has been provided for Dexilant 60 mg daily 30 minutes prior to meals for the next 6 weeks. If symptoms improve we will decrease dose to 30 mg daily.    Meds ordered this encounter  Medications  . dexlansoprazole (DEXILANT) 60 MG capsule    Sig: Take 1 capsule (60 mg total) by mouth daily.    Dispense:  30 capsule    Refill:  5  . mometasone-formoterol (DULERA) 200-5 MCG/ACT AERO    Sig: Inhale 2 puffs into the lungs 2 (two) times daily.    Dispense:  1 Inhaler    Refill:  5  . predniSONE (DELTASONE) tablet 10 mg    Sig:     Diagnositics: Spirometry revealed FVC of 2.84 L and an FEV1 of  2.43 L (87% predicted) with partial (180 mL, 8%) post bronchodilator improvement.    Physical examination: Blood pressure 118/70, pulse 76, temperature 98.1 F (36.7 C), temperature source Oral, resp. rate 20.  General: Alert, interactive, in no acute  distress. HEENT: TMs pearly gray, turbinates moderately edematous without discharge, post-pharynx erythematous. Neck: Supple without lymphadenopathy. Lungs: Clear to auscultation without wheezing, rhonchi or rales. CV: Normal S1, S2 without murmurs. Skin: Warm and dry, without lesions or rashes.  The following portions of the patient's history were reviewed and updated as appropriate: allergies, current medications, past family history, past medical history, past social history, past surgical history and problem list.    Medication List       This list is accurate as of: 08/05/15  1:39 PM.  Always use your most recent med list.               AEROCHAMBER PLUS inhaler  Use as directed with MDI     albuterol 108 (90 Base) MCG/ACT inhaler  Commonly known as:  PROVENTIL HFA;VENTOLIN HFA  Inhale 2 puffs into the lungs every 6 (six) hours as needed.     azelastine 0.1 % nasal spray  Commonly known as:  ASTELIN  Place 1 spray into both nostrils 2 (two) times daily.     Beclomethasone Dipropionate 80 MCG/ACT Aers  Commonly known as:  QNASL  Place 1 spray into the nose 2 (two) times daily.     benzonatate 100 MG capsule  Commonly known as:  TESSALON  TAKE 1-2 CAPSULES BY MOUTH 3 TIMES DAILY AS NEEDED FOR COUGH     budesonide-formoterol 160-4.5 MCG/ACT inhaler  Commonly known as:  SYMBICORT  Inhale 2 puffs into the lungs 2 (two) times daily.     dexlansoprazole 60 MG capsule  Commonly known as:  DEXILANT  Take 1 capsule (60 mg total) by mouth daily.     escitalopram 10 MG tablet  Commonly known as:  LEXAPRO  Take 1 tablet (10 mg total) by mouth daily.     fexofenadine 180 MG tablet  Commonly known as:  ALLEGRA  Take by mouth. Reported on 08/05/2015     mometasone-formoterol 200-5 MCG/ACT Aero  Commonly known as:  DULERA  Inhale 2 puffs into the lungs 2 (two) times daily.     montelukast 10 MG tablet  Commonly known as:  SINGULAIR  Take 10 mg by mouth at bedtime.      NEXIUM 20 MG capsule  Generic drug:  esomeprazole  Take by mouth. Reported on 08/05/2015     tetrahydrozoline 0.05 % ophthalmic solution  Apply 1 drop to eye daily.     TRI-LO-ESTARYLLA 0.18/0.215/0.25 MG-25 MCG tab  Generic drug:  Norgestimate-Ethinyl Estradiol Triphasic        Allergies  Allergen Reactions  . Morphine And Related Nausea And Vomiting  . Codeine Anxiety    Dry mouth, uneasy feeling   Review of systems: Constitutional: Negative for fever, chills and weight loss.  HENT: Negative for nosebleeds.   Eyes: Negative for blurred vision.  Respiratory: Negative for hemoptysis.   Positive for coughing, chest tightness, wheezing. Cardiovascular: Negative for chest pain.  Gastrointestinal: Negative for diarrhea and constipation.  Negative for heartburn. Genitourinary: Negative for dysuria.  Musculoskeletal: Negative for myalgias and joint pain.  Neurological: Negative for dizziness.  Endo/Heme/Allergies: Does not bruise/bleed easily.  Cutaneous: Negative for rash.  Past Medical History  Diagnosis Date  . Asthma   . Vision abnormalities   . Eczema  Family History  Problem Relation Age of Onset  . Hypertension Mother   . GER disease Mother   . Eczema Mother   . Hypertension Father   . Post-traumatic stress disorder Father   . Allergic rhinitis Neg Hx   . Angioedema Neg Hx   . Asthma Neg Hx   . Atopy Neg Hx   . Immunodeficiency Neg Hx   . Urticaria Neg Hx     Social History   Social History  . Marital Status: Married    Spouse Name: N/A  . Number of Children: N/A  . Years of Education: N/A   Occupational History  . Not on file.   Social History Main Topics  . Smoking status: Never Smoker   . Smokeless tobacco: Not on file  . Alcohol Use: No  . Drug Use: No  . Sexual Activity: Not on file   Other Topics Concern  . Not on file   Social History Narrative    I appreciate the opportunity to take part in this [facility]'s care. Please do not  hesitate to contact me with questions.  Sincerely,   R. Edgar Frisk, MD

## 2015-08-06 ENCOUNTER — Encounter: Payer: Self-pay | Admitting: Allergy and Immunology

## 2015-08-06 ENCOUNTER — Other Ambulatory Visit (INDEPENDENT_AMBULATORY_CARE_PROVIDER_SITE_OTHER): Payer: 59

## 2015-08-06 DIAGNOSIS — T7840XA Allergy, unspecified, initial encounter: Secondary | ICD-10-CM

## 2015-08-07 ENCOUNTER — Encounter: Payer: Self-pay | Admitting: Allergy and Immunology

## 2015-08-07 LAB — C4 COMPLEMENT: C4 Complement: 31 mg/dL (ref 10–40)

## 2015-08-07 LAB — TRYPTASE: Tryptase: 5.5 ug/L (ref <11)

## 2015-08-10 ENCOUNTER — Telehealth: Payer: Self-pay | Admitting: *Deleted

## 2015-08-10 ENCOUNTER — Encounter: Payer: Self-pay | Admitting: Allergy and Immunology

## 2015-08-10 LAB — ALPHA-GAL PANEL
Allergen, Mutton, f88: <0.1 kU/L
Allergen, Pork, f26: <0.1 kU/L
Beef: <0.1 kU/L
Galactose-alpha-1,3-galactose IgE: <0.1 kU/L (ref <0.35)

## 2015-08-10 NOTE — Telephone Encounter (Signed)
Error

## 2015-08-11 MED FILL — TRI-LO-ESTARYLLA TABLET: 0.18/0.215/ | 28 days supply | Qty: 28 | Fill #5

## 2015-08-18 MED FILL — DEXILANT DR 60 MG CAPSULE: 60 | 90 days supply | Qty: 90 | Fill #0

## 2015-08-24 MED FILL — ESCITALOPRAM 10 MG TABLET: 10 | 30 days supply | Qty: 30 | Fill #9

## 2015-08-25 ENCOUNTER — Ambulatory Visit: Payer: 59 | Admitting: Allergy and Immunology

## 2015-09-01 MED FILL — TRI-LO-ESTARYLLA TABLET: 0.18/0.215/ | 84 days supply | Qty: 84 | Fill #0

## 2015-09-02 MED FILL — AZELASTINE HCL 137 MCG SPRY: 0.1 | 30 days supply | Qty: 30 | Fill #1

## 2015-09-02 MED FILL — MONTELUKAST SOD 10 MG TAB: 10 | 30 days supply | Qty: 30 | Fill #2

## 2015-09-21 ENCOUNTER — Encounter: Payer: 59 | Admitting: Allergy and Immunology

## 2015-09-21 NOTE — Progress Notes (Signed)
This encounter was created in error - please disregard.

## 2015-09-28 MED FILL — ESCITALOPRAM 10 MG TABLET: 10 | 30 days supply | Qty: 30 | Fill #10

## 2015-09-28 MED FILL — QNASL 80 MCG NASAL SPRAY: 80 | 30 days supply | Qty: 9 | Fill #1

## 2015-10-05 MED FILL — MONTELUKAST SOD 10 MG TAB: 10 | 90 days supply | Qty: 90 | Fill #3

## 2015-10-09 DIAGNOSIS — J3089 Other allergic rhinitis: Secondary | ICD-10-CM | POA: Diagnosis not present

## 2015-10-09 DIAGNOSIS — E669 Obesity, unspecified: Secondary | ICD-10-CM | POA: Diagnosis not present

## 2015-10-09 DIAGNOSIS — J4530 Mild persistent asthma, uncomplicated: Secondary | ICD-10-CM | POA: Diagnosis not present

## 2015-10-09 DIAGNOSIS — Z0000 Encounter for general adult medical examination without abnormal findings: Secondary | ICD-10-CM | POA: Diagnosis not present

## 2015-10-09 DIAGNOSIS — F445 Conversion disorder with seizures or convulsions: Secondary | ICD-10-CM | POA: Diagnosis not present

## 2015-10-09 DIAGNOSIS — Z3041 Encounter for surveillance of contraceptive pills: Secondary | ICD-10-CM | POA: Diagnosis not present

## 2015-10-09 DIAGNOSIS — D259 Leiomyoma of uterus, unspecified: Secondary | ICD-10-CM | POA: Diagnosis not present

## 2015-10-09 DIAGNOSIS — F329 Major depressive disorder, single episode, unspecified: Secondary | ICD-10-CM | POA: Diagnosis not present

## 2015-11-03 ENCOUNTER — Other Ambulatory Visit: Payer: Self-pay | Admitting: Neurology

## 2015-11-03 MED FILL — ESCITALOPRAM 10 MG TABLET: 10 | 30 days supply | Qty: 30 | Fill #0 | Status: TO

## 2015-11-05 DIAGNOSIS — N898 Other specified noninflammatory disorders of vagina: Secondary | ICD-10-CM | POA: Diagnosis not present

## 2015-11-05 DIAGNOSIS — B373 Candidiasis of vulva and vagina: Secondary | ICD-10-CM | POA: Diagnosis not present

## 2015-11-05 MED FILL — FLUCONAZOLE 150 MG TABLET: 150 | 3 days supply | Qty: 2 | Fill #0

## 2015-11-25 DIAGNOSIS — N898 Other specified noninflammatory disorders of vagina: Secondary | ICD-10-CM | POA: Diagnosis not present

## 2015-11-25 DIAGNOSIS — B373 Candidiasis of vulva and vagina: Secondary | ICD-10-CM | POA: Diagnosis not present

## 2015-11-25 DIAGNOSIS — F329 Major depressive disorder, single episode, unspecified: Secondary | ICD-10-CM | POA: Diagnosis not present

## 2015-11-25 MED FILL — FLUCONAZOLE 150 MG TABLET: 150 | 5 days supply | Qty: 5 | Fill #0

## 2015-11-25 MED FILL — valACYclovir HCL 1 GM TABS: 1 | 7 days supply | Qty: 14 | Fill #0

## 2015-11-30 MED FILL — TRI-LO-MARZIA TABLET: 0.18/0.215/ | 84 days supply | Qty: 84 | Fill #0

## 2015-11-30 MED FILL — DEXILANT DR 60 MG CAPSULE: 60 | 90 days supply | Qty: 90 | Fill #1

## 2015-12-15 DIAGNOSIS — N760 Acute vaginitis: Secondary | ICD-10-CM | POA: Diagnosis not present

## 2015-12-15 MED FILL — FLUCONAZOLE 150 MG TABLET: 150 | 1 days supply | Qty: 1 | Fill #0

## 2015-12-15 MED FILL — metroNIDAZOLE 0.75 % GEL: 0.75 | 5 days supply | Qty: 70 | Fill #0

## 2015-12-22 MED FILL — QNASL 80 MCG NASAL SPRAY: 80 | 30 days supply | Qty: 9 | Fill #2

## 2016-01-04 DIAGNOSIS — N898 Other specified noninflammatory disorders of vagina: Secondary | ICD-10-CM | POA: Diagnosis not present

## 2016-01-04 DIAGNOSIS — A599 Trichomoniasis, unspecified: Secondary | ICD-10-CM | POA: Diagnosis not present

## 2016-01-04 DIAGNOSIS — B373 Candidiasis of vulva and vagina: Secondary | ICD-10-CM | POA: Diagnosis not present

## 2016-01-04 DIAGNOSIS — R8290 Unspecified abnormal findings in urine: Secondary | ICD-10-CM | POA: Diagnosis not present

## 2016-01-04 MED FILL — metroNIDAZOLE 500 MG TABS: 500 | 7 days supply | Qty: 14 | Fill #0

## 2016-01-04 MED FILL — FLUCONAZOLE 150 MG TABLET: 150 | 3 days supply | Qty: 2 | Fill #0

## 2016-01-04 MED FILL — ESCITALOPRAM 10 MG TABLET: 10 | 30 days supply | Qty: 30 | Fill #0

## 2016-01-13 MED FILL — MONTELUKAST SOD 10 MG TAB: 10 | 90 days supply | Qty: 90 | Fill #4

## 2016-01-25 DIAGNOSIS — R102 Pelvic and perineal pain: Secondary | ICD-10-CM | POA: Diagnosis not present

## 2016-01-25 DIAGNOSIS — N9489 Other specified conditions associated with female genital organs and menstrual cycle: Secondary | ICD-10-CM | POA: Diagnosis not present

## 2016-02-03 DIAGNOSIS — N898 Other specified noninflammatory disorders of vagina: Secondary | ICD-10-CM | POA: Diagnosis not present

## 2016-02-03 DIAGNOSIS — Z8619 Personal history of other infectious and parasitic diseases: Secondary | ICD-10-CM | POA: Diagnosis not present

## 2016-02-03 DIAGNOSIS — R109 Unspecified abdominal pain: Secondary | ICD-10-CM | POA: Diagnosis not present

## 2016-02-08 MED FILL — metroNIDAZOLE 500 MG TABS: 500 | 7 days supply | Qty: 14 | Fill #0

## 2016-02-09 DIAGNOSIS — R102 Pelvic and perineal pain: Secondary | ICD-10-CM | POA: Diagnosis not present

## 2016-02-09 DIAGNOSIS — M6289 Other specified disorders of muscle: Secondary | ICD-10-CM | POA: Diagnosis not present

## 2016-02-09 DIAGNOSIS — E669 Obesity, unspecified: Secondary | ICD-10-CM | POA: Diagnosis not present

## 2016-02-09 DIAGNOSIS — Z6832 Body mass index (BMI) 32.0-32.9, adult: Secondary | ICD-10-CM | POA: Diagnosis not present

## 2016-02-11 MED FILL — ESCITALOPRAM 10 MG TABLET: 10 | 30 days supply | Qty: 30 | Fill #1

## 2016-02-18 MED FILL — TRI-LO-MARZIA TABLET: 0.18/0.215/ | 84 days supply | Qty: 84 | Fill #1

## 2016-02-18 MED FILL — DULERA 200 MCG/5 MCG INH: 200-5 | 30 days supply | Qty: 13 | Fill #1

## 2016-02-29 MED FILL — AZELASTINE HCL 137 MCG SPRY: 0.1 | 30 days supply | Qty: 30 | Fill #2

## 2016-02-29 MED FILL — QNASL 80 MCG NASAL SPRAY: 80 | 30 days supply | Qty: 9 | Fill #3

## 2016-03-08 DIAGNOSIS — R102 Pelvic and perineal pain: Secondary | ICD-10-CM | POA: Diagnosis not present

## 2016-03-08 DIAGNOSIS — N8189 Other female genital prolapse: Secondary | ICD-10-CM | POA: Diagnosis not present

## 2016-03-14 ENCOUNTER — Other Ambulatory Visit: Payer: Self-pay | Admitting: Allergy and Immunology

## 2016-03-14 DIAGNOSIS — R05 Cough: Secondary | ICD-10-CM

## 2016-03-14 DIAGNOSIS — R053 Chronic cough: Secondary | ICD-10-CM

## 2016-03-14 MED FILL — DEXILANT DR 60 MG CAPSULE: 60 | 30 days supply | Qty: 30 | Fill #0

## 2016-03-17 MED FILL — ESCITALOPRAM 10 MG TABLET: 10 | 30 days supply | Qty: 30 | Fill #2

## 2016-04-05 DIAGNOSIS — R102 Pelvic and perineal pain: Secondary | ICD-10-CM | POA: Diagnosis not present

## 2016-04-05 DIAGNOSIS — N9489 Other specified conditions associated with female genital organs and menstrual cycle: Secondary | ICD-10-CM | POA: Diagnosis not present

## 2016-04-20 MED FILL — ESCITALOPRAM 10 MG TABLET: 10 | 30 days supply | Qty: 30 | Fill #3

## 2016-04-26 ENCOUNTER — Encounter: Payer: Self-pay | Admitting: Allergy and Immunology

## 2016-04-26 ENCOUNTER — Ambulatory Visit (INDEPENDENT_AMBULATORY_CARE_PROVIDER_SITE_OTHER): Payer: 59 | Admitting: Allergy and Immunology

## 2016-04-26 VITALS — BP 102/68 | HR 78 | Temp 98.3°F | Resp 14 | Ht 62.0 in | Wt 181.0 lb

## 2016-04-26 DIAGNOSIS — J3089 Other allergic rhinitis: Secondary | ICD-10-CM | POA: Diagnosis not present

## 2016-04-26 DIAGNOSIS — R05 Cough: Secondary | ICD-10-CM

## 2016-04-26 DIAGNOSIS — K219 Gastro-esophageal reflux disease without esophagitis: Secondary | ICD-10-CM | POA: Insufficient documentation

## 2016-04-26 DIAGNOSIS — R053 Chronic cough: Secondary | ICD-10-CM

## 2016-04-26 DIAGNOSIS — J4540 Moderate persistent asthma, uncomplicated: Secondary | ICD-10-CM | POA: Diagnosis not present

## 2016-04-26 MED ORDER — DEXLANSOPRAZOLE 30 MG PO CPDR: 30.0000 mg | DELAYED_RELEASE_CAPSULE | Freq: Every day | ORAL | 2 refills | Status: DC

## 2016-04-26 NOTE — Progress Notes (Signed)
Follow-up Note  RE: Jennifer Dean MRN: [id number] DOB: 01/29/1981 Date of Office Visit: 04/26/2016  Primary care provider: Leamon Arnt, MD Referring provider: Leamon Arnt, MD  History of present illness: Jennifer Dean is a 36 y.o. female with persistent asthma, allergic rhinitis, and history of persistent cough presenting today for follow up.  She was last seen in this clinic in May 2017.  She reports that up until 2 weeks ago her upper or lower respiration symptoms have been well-controlled with Dulera 200-5 g, 2 inhalations via spacer device twice a day, montelukast 10 mg daily at bedtime, and azelastine nasal spray.  She has not used nasal saline lavage or guaifenesin in quite some time.  Her reflux symptoms seem to be well controlled with Dexilant, though she needs a refill.  She reports the approximately 2 weeks ago she had been in an old building and the persistent cough returned.  Now that she is not having back and that building the symptoms have improved somewhat, however she is still experiencing coughing episodes, though to a lesser degree.   Assessment and plan: Persistent cough Multifactorial.  Treatment plan as outlined below.  Perennial and seasonal allergic rhinitis  Continue appropriate allergen avoidance measures, azelastine nasal spray, and Qnasl.  I have encouraged her to restart guaifenesin 1200 mg twice a day for now as well as nasal saline irrigation prior to medicated nasal sprays.  Asthma  Continue Dulera 200-5 g, 2 inhalations via spacer device twice a day, montelukast 10 mg daily bedtime, and albuterol HFA, 1-2 inhalations every 4-6 hours as needed.  Subjective and objective measures of pulmonary function will be followed and the treatment plan will be adjusted accordingly.  Acid reflux  Continue appropriate reflux lifestyle modifications.  A refill prescription has been provided for Dexilant.   Diagnostics: Spirometry:  Normal  with an FEV1 of 90% predicted.  Please see scanned spirometry results for details.    Physical examination: Blood pressure 102/68, pulse 78, temperature 98.3 F (36.8 C), temperature source Oral, resp. rate 14, height 5\' 2"  (1.575 m), weight 181 lb (82.1 kg), SpO2 99 %.  General: Alert, interactive, in no acute distress. HEENT: TMs pearly gray, turbinates mildly edematous without discharge, post-pharynx moderately erythematous. Neck: Supple without lymphadenopathy. Lungs: Clear to auscultation without wheezing, rhonchi or rales. CV: Normal S1, S2 without murmurs. Skin: Warm and dry, without lesions or rashes.  The following portions of the patient's history were reviewed and updated as appropriate: allergies, current medications, past family history, past medical history, past social history, past surgical history and problem list.  Allergies as of 04/26/2016      Reactions   Morphine And Related Nausea And Vomiting   Codeine Anxiety   Dry mouth, uneasy feeling      Medication List       Accurate as of 04/26/16  8:02 PM. Always use your most recent med list.          AEROCHAMBER PLUS inhaler Use as directed with MDI   albuterol 108 (90 Base) MCG/ACT inhaler Commonly known as:  PROVENTIL HFA;VENTOLIN HFA Inhale 2 puffs into the lungs every 6 (six) hours as needed.   azelastine 0.1 % nasal spray Commonly known as:  ASTELIN Place 1 spray into both nostrils 2 (two) times daily.   Beclomethasone Dipropionate 80 MCG/ACT Aers Commonly known as:  QNASL Place 1 spray into the nose 2 (two) times daily.   Dexlansoprazole 30 MG capsule Commonly known as:  DEXILANT  Take 1 capsule (30 mg total) by mouth daily.   escitalopram 10 MG tablet Commonly known as:  LEXAPRO TAKE 1 TABLET BY MOUTH DAILY.   mometasone-formoterol 200-5 MCG/ACT Aero Commonly known as:  DULERA Inhale 2 puffs into the lungs 2 (two) times daily.   montelukast 10 MG tablet Commonly known as:   SINGULAIR Take 10 mg by mouth at bedtime.   TRI-LO-ESTARYLLA 0.18/0.215/0.25 MG-25 MCG tab Generic drug:  Norgestimate-Ethinyl Estradiol Triphasic       Allergies  Allergen Reactions  . Morphine And Related Nausea And Vomiting  . Codeine Anxiety    Dry mouth, uneasy feeling    I appreciate the opportunity to take part in Jennifer Dean's care. Please do not hesitate to contact me with questions.  Sincerely,   R. Edgar Frisk, MD

## 2016-04-26 NOTE — Patient Instructions (Signed)
Persistent cough Multifactorial.  Treatment plan as outlined below.  Perennial and seasonal allergic rhinitis  Continue appropriate allergen avoidance measures, azelastine nasal spray, and Qnasl.  I have encouraged her to restart guaifenesin 1200 mg twice a day for now as well as nasal saline irrigation prior to medicated nasal sprays.  Asthma  Continue Dulera 200-5 g, 2 inhalations via spacer device twice a day, montelukast 10 mg daily bedtime, and albuterol HFA, 1-2 inhalations every 4-6 hours as needed.  Subjective and objective measures of pulmonary function will be followed and the treatment plan will be adjusted accordingly.  Acid reflux  Continue appropriate reflux lifestyle modifications.  A refill prescription has been provided for Dexilant.   Return in about 4 months (around 08/24/2016).

## 2016-04-26 NOTE — Assessment & Plan Note (Signed)
   Continue appropriate allergen avoidance measures, azelastine nasal spray, and Qnasl.  I have encouraged her to restart guaifenesin 1200 mg twice a day for now as well as nasal saline irrigation prior to medicated nasal sprays.

## 2016-04-26 NOTE — Assessment & Plan Note (Signed)
Multifactorial.  Treatment plan as outlined below.

## 2016-04-26 NOTE — Assessment & Plan Note (Signed)
   Continue Dulera 200-5 g, 2 inhalations via spacer device twice a day, montelukast 10 mg daily bedtime, and albuterol HFA, 1-2 inhalations every 4-6 hours as needed.  Subjective and objective measures of pulmonary function will be followed and the treatment plan will be adjusted accordingly.

## 2016-04-26 NOTE — Assessment & Plan Note (Signed)
   Continue appropriate reflux lifestyle modifications.  A refill prescription has been provided for Dexilant.

## 2016-04-27 MED FILL — DEXILANT DR 30 MG CAPSULE: 30 | 90 days supply | Qty: 90 | Fill #0

## 2016-04-28 MED FILL — MONTELUKAST SOD 10 MG TAB: 10 | 90 days supply | Qty: 90 | Fill #5

## 2016-05-13 MED FILL — TRI-LO-MARZIA TABLET: 0.18/0.215/ | 84 days supply | Qty: 84 | Fill #2

## 2016-05-16 MED FILL — QNASL 80 MCG NASAL SPRAY: 80 | 30 days supply | Qty: 9 | Fill #4

## 2016-05-22 ENCOUNTER — Ambulatory Visit (HOSPITAL_COMMUNITY)
Admission: EM | Admit: 2016-05-22 | Discharge: 2016-05-22 | Disposition: A | Discharge status: Home / Self Care | Payer: 59 | Attending: Family Medicine | Admitting: Family Medicine

## 2016-05-22 ENCOUNTER — Encounter (HOSPITAL_COMMUNITY): Payer: Self-pay | Admitting: Emergency Medicine

## 2016-05-22 DIAGNOSIS — J069 Acute upper respiratory infection, unspecified: Secondary | ICD-10-CM

## 2016-05-22 MED ORDER — IPRATROPIUM BROMIDE 0.06 % NA SOLN: 2.0000 | Freq: Four times a day (QID) | NASAL | 2 refills | Status: DC

## 2016-05-22 NOTE — ED Triage Notes (Signed)
The patient presented to the [facility] with a complaint of a cough, bilateral ear pain and body aches x 3 days. The patient denied any fever at home.
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
## 2016-05-27 DIAGNOSIS — H5213 Myopia, bilateral: Secondary | ICD-10-CM | POA: Diagnosis not present

## 2016-05-27 DIAGNOSIS — H52223 Regular astigmatism, bilateral: Secondary | ICD-10-CM | POA: Diagnosis not present

## 2016-05-27 MED FILL — ESCITALOPRAM 10 MG TABLET: 10 | 30 days supply | Qty: 30 | Fill #4

## 2016-06-30 DIAGNOSIS — N898 Other specified noninflammatory disorders of vagina: Secondary | ICD-10-CM | POA: Diagnosis not present

## 2016-06-30 DIAGNOSIS — R3129 Other microscopic hematuria: Secondary | ICD-10-CM | POA: Diagnosis not present

## 2016-06-30 MED FILL — FLUCONAZOLE 150 MG TABLET: 150 | 7 days supply | Qty: 2 | Fill #0

## 2016-06-30 MED FILL — metroNIDAZOLE 500 MG TABS: 500 | 7 days supply | Qty: 14 | Fill #0

## 2016-07-04 MED FILL — AZELASTINE HCL 137 MCG SPRY: 0.1 | 30 days supply | Qty: 30 | Fill #0

## 2016-07-05 MED FILL — ESCITALOPRAM 10 MG TABLET: 10 | 30 days supply | Qty: 30 | Fill #5

## 2016-07-06 ENCOUNTER — Encounter (HOSPITAL_COMMUNITY): Payer: Self-pay | Admitting: Family Medicine

## 2016-07-06 ENCOUNTER — Ambulatory Visit (HOSPITAL_COMMUNITY)
Admission: EM | Admit: 2016-07-06 | Discharge: 2016-07-06 | Disposition: A | Discharge status: Home / Self Care | Payer: 59 | Attending: Internal Medicine | Admitting: Internal Medicine

## 2016-07-06 DIAGNOSIS — B349 Viral infection, unspecified: Secondary | ICD-10-CM | POA: Diagnosis not present

## 2016-07-06 NOTE — Discharge Instructions (Signed)
Sudafed PE 10 mg every 4 to 6 hours as needed for congestion Allegra or Zyrtec daily as needed for drainage and runny nose. For stronger antihistamine may take Chlor-Trimeton 2 to 4 mg every 4 to 6 hours, may cause drowsiness. Saline nasal spray used frequently. Ibuprofen 600 mg every 6 hours as needed for pain, discomfort or fever. Drink plenty of fluids and stay well-hydrated. Use your albuterol inhaler for coughing spasms or increased coughing or wheezing.

## 2016-07-06 NOTE — ED Provider Notes (Signed)
CSN: [id number]     Arrival date & time 07/06/16  1827 History   First MD Initiated Contact with Patient 07/06/16 1858     Chief Complaint  Patient presents with  . Generalized Body Aches   (Consider location/radiation/quality/duration/timing/severity/associated sxs/prior Treatment) 36 year old female presents to the urgent care complaining of body aches and started today. She has regional cough, like PND and minor nausea. No shortness of breath or chest pain. She states she had a fever of 100 earlier today currently she is afebrile. She has a history of asthma.      Past Medical History:  Diagnosis Date  . Asthma   . Eczema   . Vision abnormalities    Past Surgical History:  Procedure Laterality Date  . WISDOM TOOTH EXTRACTION     Family History  Problem Relation Age of Onset  . Hypertension Mother   . GER disease Mother   . Eczema Mother   . Hypertension Father   . Post-traumatic stress disorder Father   . Allergic rhinitis Neg Hx   . Angioedema Neg Hx   . Asthma Neg Hx   . Atopy Neg Hx   . Immunodeficiency Neg Hx   . Urticaria Neg Hx    Social History  Substance Use Topics  . Smoking status: Never Smoker  . Smokeless tobacco: Never Used  . Alcohol use No   OB History    No data available     Review of Systems  Constitutional: Positive for activity change and fever. Negative for appetite change, chills and fatigue.  HENT: Positive for congestion, postnasal drip and rhinorrhea. Negative for facial swelling.   Eyes: Negative.   Respiratory: Positive for cough.   Cardiovascular: Negative.   Gastrointestinal: Positive for nausea.  Musculoskeletal: Negative for neck pain and neck stiffness.  Skin: Negative for pallor and rash.  Neurological: Negative.   All other systems reviewed and are negative.   Allergies  Morphine and related and Codeine  Home Medications   Prior to Admission medications   Medication Sig Start Date End Date Taking? Authorizing  Provider  albuterol (PROVENTIL HFA;VENTOLIN HFA) 108 (90 BASE) MCG/ACT inhaler Inhale 2 puffs into the lungs every 6 (six) hours as needed. 02/24/15   Elby Beck, FNP  azelastine (ASTELIN) 0.1 % nasal spray Place 1 spray into both nostrils 2 (two) times daily.  07/07/15   Historical Provider, MD  Beclomethasone Dipropionate (QNASL) 80 MCG/ACT AERS Place 1 spray into the nose 2 (two) times daily. 07/15/15   Adelina Mings, MD  Dexlansoprazole (DEXILANT) 30 MG capsule Take 1 capsule (30 mg total) by mouth daily. 04/26/16   Adelina Mings, MD  escitalopram (LEXAPRO) 10 MG tablet TAKE 1 TABLET BY MOUTH DAILY. 11/03/15   Britt Bottom, MD  ipratropium (ATROVENT) 0.06 % nasal spray Place 2 sprays into both nostrils 4 (four) times daily. 05/22/16   Billy Fischer, MD  mometasone-formoterol (DULERA) 200-5 MCG/ACT AERO Inhale 2 puffs into the lungs 2 (two) times daily. 08/05/15   Adelina Mings, MD  montelukast (SINGULAIR) 10 MG tablet Take 10 mg by mouth at bedtime.  05/25/15   Historical Provider, MD  TRI-LO-ESTARYLLA 0.18/0.215/0.25 MG-25 MCG tab  02/17/15   Historical Provider, MD   Meds Ordered and Administered this Visit  Medications - No data to display  BP 117/83   Pulse 89   Temp 98.6 F (37 C)   Resp 18   LMP 07/06/2016   SpO2 98%  No data  found.   Physical Exam  Constitutional: She is oriented to person, place, and time. She appears well-developed and well-nourished. No distress.  HENT:  Head: Normocephalic and atraumatic.  Mouth/Throat: No oropharyngeal exudate.  Bilateral TMs normal. Oropharynx not well seen due to patient's retraction, upper posterior pharynx with light erythema and clear PND.  Eyes: EOM are normal.  Neck: Normal range of motion. Neck supple.  Cardiovascular: Normal rate, regular rhythm and normal heart sounds.   Pulmonary/Chest: Effort normal and breath sounds normal. No respiratory distress. She has no wheezes. She has no rales.   Musculoskeletal: Normal range of motion. She exhibits no edema.  Lymphadenopathy:    She has no cervical adenopathy.  Neurological: She is alert and oriented to person, place, and time.  Skin: Skin is warm and dry. No rash noted.  Psychiatric: She has a normal mood and affect.  Nursing note and vitals reviewed.   Urgent Care Course     Procedures (including critical care time)  Labs Review Labs Reviewed - No data to display  Imaging Review No results found.   Visual Acuity Review  Right Eye Distance:   Left Eye Distance:   Bilateral Distance:    Right Eye Near:   Left Eye Near:    Bilateral Near:         MDM   1. Viral syndrome    Sudafed PE 10 mg every 4 to 6 hours as needed for congestion Allegra or Zyrtec daily as needed for drainage and runny nose. For stronger antihistamine may take Chlor-Trimeton 2 to 4 mg every 4 to 6 hours, may cause drowsiness. Saline nasal spray used frequently. Ibuprofen 600 mg every 6 hours as needed for pain, discomfort or fever. Drink plenty of fluids and stay well-hydrated. Use your albuterol inhaler for coughing spasms or increased coughing or wheezing.    Janne Napoleon, NP 07/06/16 [phone]

## 2016-07-06 NOTE — ED Triage Notes (Signed)
Pt here for body aches, chills and cough since this am.

## 2016-07-11 DIAGNOSIS — R0602 Shortness of breath: Secondary | ICD-10-CM | POA: Diagnosis not present

## 2016-07-11 DIAGNOSIS — J988 Other specified respiratory disorders: Secondary | ICD-10-CM | POA: Diagnosis not present

## 2016-07-11 DIAGNOSIS — G40909 Epilepsy, unspecified, not intractable, without status epilepticus: Secondary | ICD-10-CM | POA: Diagnosis not present

## 2016-07-11 DIAGNOSIS — R509 Fever, unspecified: Secondary | ICD-10-CM | POA: Diagnosis not present

## 2016-07-11 MED FILL — LORazepam 1 MG TABS: 1 | 4 days supply | Qty: 5 | Fill #0

## 2016-07-11 MED FILL — DOXYCYCLINE HYC 100 MG CAP: 100 | 10 days supply | Qty: 20 | Fill #0

## 2016-07-15 ENCOUNTER — Emergency Department (HOSPITAL_COMMUNITY)
Admission: EM | Admit: 2016-07-15 | Discharge: 2016-07-15 | Disposition: A | Discharge status: Home / Self Care | Payer: 59 | Attending: Emergency Medicine | Admitting: Emergency Medicine

## 2016-07-15 ENCOUNTER — Encounter (HOSPITAL_COMMUNITY): Payer: Self-pay | Admitting: Emergency Medicine

## 2016-07-15 DIAGNOSIS — R52 Pain, unspecified: Secondary | ICD-10-CM | POA: Diagnosis present

## 2016-07-15 DIAGNOSIS — B349 Viral infection, unspecified: Secondary | ICD-10-CM | POA: Insufficient documentation

## 2016-07-15 DIAGNOSIS — J45909 Unspecified asthma, uncomplicated: Secondary | ICD-10-CM | POA: Diagnosis not present

## 2016-07-15 DIAGNOSIS — R110 Nausea: Secondary | ICD-10-CM | POA: Insufficient documentation

## 2016-07-15 DIAGNOSIS — Z79899 Other long term (current) drug therapy: Secondary | ICD-10-CM | POA: Diagnosis not present

## 2016-07-15 LAB — BASIC METABOLIC PANEL
ANION GAP: 6 (ref 5–15)
BUN: 12 mg/dL (ref 6–20)
CALCIUM: 8.7 mg/dL — AB (ref 8.9–10.3)
CO2: 26 mmol/L (ref 22–32)
CREATININE: 0.71 mg/dL (ref 0.44–1.00)
Chloride: 106 mmol/L (ref 101–111)
Glucose, Bld: 79 mg/dL (ref 65–99)
Potassium: 4.1 mmol/L (ref 3.5–5.1)
SODIUM: 138 mmol/L (ref 135–145)

## 2016-07-15 LAB — HEPATIC FUNCTION PANEL
ALBUMIN: 3.5 g/dL (ref 3.5–5.0)
ALK PHOS: 46 U/L (ref 38–126)
ALT: 13 U/L — AB (ref 14–54)
AST: 21 U/L (ref 15–41)
BILIRUBIN TOTAL: 0.6 mg/dL (ref 0.3–1.2)
Bilirubin, Direct: 0.1 mg/dL (ref 0.1–0.5)
Indirect Bilirubin: 0.5 mg/dL (ref 0.3–0.9)
TOTAL PROTEIN: 7.2 g/dL (ref 6.5–8.1)

## 2016-07-15 LAB — URINALYSIS, ROUTINE W REFLEX MICROSCOPIC
BILIRUBIN URINE: NEGATIVE
Glucose, UA: NEGATIVE mg/dL
HGB URINE DIPSTICK: NEGATIVE
KETONES UR: NEGATIVE mg/dL
Leukocytes, UA: NEGATIVE
NITRITE: NEGATIVE
Protein, ur: NEGATIVE mg/dL
SPECIFIC GRAVITY, URINE: 1.014 (ref 1.005–1.030)
pH: 7 (ref 5.0–8.0)

## 2016-07-15 LAB — CBC
HCT: 36.6 % (ref 36.0–46.0)
HEMOGLOBIN: 12 g/dL (ref 12.0–15.0)
MCH: 29.2 pg (ref 26.0–34.0)
MCHC: 32.8 g/dL (ref 30.0–36.0)
MCV: 89.1 fL (ref 78.0–100.0)
PLATELETS: 360 10*3/uL (ref 150–400)
RBC: 4.11 MIL/uL (ref 3.87–5.11)
RDW: 13 % (ref 11.5–15.5)
WBC: 7.2 10*3/uL (ref 4.0–10.5)

## 2016-07-15 LAB — I-STAT BETA HCG BLOOD, ED (MC, WL, AP ONLY): I-stat hCG, quantitative: <5 m[IU]/mL (ref <5)

## 2016-07-15 LAB — CBG MONITORING, ED: GLUCOSE-CAPILLARY: 79 mg/dL (ref 65–99)

## 2016-07-15 LAB — LIPASE, BLOOD: Lipase: 20 U/L (ref 11–51)

## 2016-07-15 MED ORDER — LACTATED RINGERS IV BOLUS (SEPSIS)
1000.0000 mL | Freq: Once | INTRAVENOUS | Status: AC
  Administered 2016-07-15: 1000 mL via INTRAVENOUS

## 2016-07-15 MED ORDER — LORAZEPAM 2 MG/ML IJ SOLN
1.0000 mg | Freq: Once | INTRAMUSCULAR | Status: AC
  Administered 2016-07-15: 1 mg via INTRAVENOUS
  Filled 2016-07-15: qty 1

## 2016-07-15 MED ORDER — METOCLOPRAMIDE HCL 5 MG/ML IJ SOLN: 10.0000 mg | Freq: Once | INTRAMUSCULAR | Status: DC

## 2016-07-15 MED ORDER — PROCHLORPERAZINE EDISYLATE 5 MG/ML IJ SOLN
10.0000 mg | Freq: Once | INTRAMUSCULAR | Status: AC
  Administered 2016-07-15: 10 mg via INTRAVENOUS
  Filled 2016-07-15: qty 2

## 2016-07-15 MED ORDER — HALOPERIDOL LACTATE 5 MG/ML IJ SOLN: 2.0000 mg | Freq: Once | INTRAMUSCULAR | Status: DC

## 2016-07-15 MED ORDER — DIPHENHYDRAMINE HCL 50 MG/ML IJ SOLN
25.0000 mg | Freq: Once | INTRAMUSCULAR | Status: AC
  Administered 2016-07-15: 25 mg via INTRAVENOUS
  Filled 2016-07-15: qty 1

## 2016-07-15 MED ORDER — ONDANSETRON 4 MG PO TBDP: 4.0000 mg | ORAL_TABLET | Freq: Three times a day (TID) | ORAL | 0 refills | Status: DC | PRN

## 2016-07-15 MED ORDER — DICYCLOMINE HCL 20 MG PO TABS: 20.0000 mg | ORAL_TABLET | Freq: Three times a day (TID) | ORAL | 0 refills | Status: DC

## 2016-07-15 NOTE — ED Notes (Signed)
Bed: WA16 Expected date:  Expected time:  Means of arrival:  Comments: 
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
## 2016-07-15 NOTE — ED Triage Notes (Signed)
Pt verbalizes generalized abdominal pain, n/v, dizziness, weakness, and shaking since and evaluated on Monday; symptoms better but reoccurred today. With triage intermittent purposeful shaking noted while VS obtained; pt speaking full sentence and alert and oriented throughout event.

## 2016-07-15 NOTE — ED Notes (Addendum)
UNABLE TO OBTAIN LABS

## 2016-07-18 MED FILL — DICYCLOMINE 20 MG TABLET: 20 | 7 days supply | Qty: 28 | Fill #0

## 2016-07-18 MED FILL — ONDANSETRON ODT 4 MG TABLET: 4 | 4 days supply | Qty: 12 | Fill #0

## 2016-07-21 ENCOUNTER — Other Ambulatory Visit: Payer: Self-pay

## 2016-07-21 DIAGNOSIS — J3089 Other allergic rhinitis: Secondary | ICD-10-CM

## 2016-07-21 MED ORDER — BECLOMETHASONE DIPROPIONATE 80 MCG/ACT NA AERS: 1.0000 | INHALATION_SPRAY | Freq: Two times a day (BID) | NASAL | 5 refills | Status: DC

## 2016-07-29 ENCOUNTER — Other Ambulatory Visit: Payer: Self-pay

## 2016-07-29 ENCOUNTER — Other Ambulatory Visit: Payer: Self-pay | Admitting: Allergy and Immunology

## 2016-07-29 DIAGNOSIS — J3089 Other allergic rhinitis: Secondary | ICD-10-CM

## 2016-07-29 MED ORDER — BECLOMETHASONE DIPROPIONATE 80 MCG/ACT NA AERS: 1.0000 | INHALATION_SPRAY | Freq: Two times a day (BID) | NASAL | 5 refills | Status: DC

## 2016-07-29 MED FILL — QNASL 80 MCG NASAL SPRAY: 80 | 30 days supply | Qty: 9 | Fill #0

## 2016-07-29 NOTE — Telephone Encounter (Signed)
Pt requested [place].

## 2016-08-05 MED FILL — TRI-LO-MARZIA TABLET: 0.18/0.215/ | 84 days supply | Qty: 84 | Fill #3

## 2016-08-09 MED FILL — ESCITALOPRAM 10 MG TABLET: 10 | 30 days supply | Qty: 30 | Fill #6

## 2016-08-15 ENCOUNTER — Other Ambulatory Visit: Payer: Self-pay | Admitting: Allergy and Immunology

## 2016-08-15 DIAGNOSIS — J4541 Moderate persistent asthma with (acute) exacerbation: Secondary | ICD-10-CM

## 2016-08-15 MED FILL — MONTELUKAST SOD 10 MG TAB: 10 | 90 days supply | Qty: 90 | Fill #0

## 2016-09-15 ENCOUNTER — Other Ambulatory Visit: Payer: Self-pay | Admitting: Allergy and Immunology

## 2016-09-15 DIAGNOSIS — K219 Gastro-esophageal reflux disease without esophagitis: Secondary | ICD-10-CM

## 2016-09-15 MED FILL — ESCITALOPRAM 10 MG TABLET: 10 | 30 days supply | Qty: 30 | Fill #7

## 2016-09-16 ENCOUNTER — Telehealth: Payer: Self-pay

## 2016-09-16 MED ORDER — OMEPRAZOLE 20 MG PO CPDR: 20.0000 mg | DELAYED_RELEASE_CAPSULE | Freq: Every day | ORAL | 0 refills | Status: DC

## 2016-09-16 MED FILL — OMEPRAZOLE DR 20 MG CAPSULE: 20 | 30 days supply | Qty: 30 | Fill #0

## 2016-09-16 NOTE — Telephone Encounter (Signed)
Patient called back and we discussed using Omeprazole.

## 2016-09-16 NOTE — Addendum Note (Signed)
Addended by: Martyn Malay on: 09/16/2016 11:56 AM   Modules accepted: Orders

## 2016-09-16 NOTE — Telephone Encounter (Signed)
Called patient Jennifer Dean regards to a fax we received for a PA for Dexilant. The Fax stated the patient needs to try omprazole, Lansoprazole or pantaprazole. Jennifer Dean consulted with Dr. Nelva Bush and she said to inform patient that we are sending in Omeprazole to the [facility] 20 mg daily. We need to have her try this for 4 weeks in order for [place] to send a PA for Dexilant.

## 2016-09-28 MED FILL — DULERA 200 MCG/5 MCG INH: 200-5 | 30 days supply | Qty: 13 | Fill #0

## 2016-09-28 MED FILL — QNASL 80 MCG NASAL SPRAY: 80 | 30 days supply | Qty: 9 | Fill #1

## 2016-10-18 MED FILL — AZELASTINE HCL 137 MCG SPRY: 0.1 | 30 days supply | Qty: 30 | Fill #1

## 2016-10-18 MED FILL — ESCITALOPRAM 10 MG TABLET: 10 | 30 days supply | Qty: 30 | Fill #8

## 2016-11-02 MED FILL — TRI-LO-MARZIA TABLET: 0.18/0.215/ | 28 days supply | Qty: 28 | Fill #0

## 2016-11-07 ENCOUNTER — Other Ambulatory Visit: Payer: Self-pay | Admitting: Allergy and Immunology

## 2016-11-07 MED ORDER — OMEPRAZOLE 20 MG PO CPDR: 20.0000 mg | DELAYED_RELEASE_CAPSULE | Freq: Every day | ORAL | 0 refills | Status: DC

## 2016-11-07 MED FILL — OMEPRAZOLE DR 20 MG CAPSULE: 20 | 30 days supply | Qty: 30 | Fill #0

## 2016-11-07 NOTE — Telephone Encounter (Signed)
Spoke to patient advised we would send in 1 refill but she needs an appt. Patient scheduled appt for 11/15/16 @ 3:45pm. Refill sent in

## 2016-11-07 NOTE — Telephone Encounter (Signed)
Patient would like a refill for omeprazole. Pharmacy is [facility]. She said the pharmacy said they have faxed [place] 2-3 times with no response.

## 2016-11-15 ENCOUNTER — Ambulatory Visit (INDEPENDENT_AMBULATORY_CARE_PROVIDER_SITE_OTHER): Payer: 59 | Admitting: Allergy and Immunology

## 2016-11-15 ENCOUNTER — Encounter: Payer: Self-pay | Admitting: Allergy and Immunology

## 2016-11-15 VITALS — BP 120/70 | HR 84 | Temp 98.3°F | Resp 16 | Ht 62.5 in | Wt 178.0 lb

## 2016-11-15 DIAGNOSIS — R05 Cough: Secondary | ICD-10-CM | POA: Diagnosis not present

## 2016-11-15 DIAGNOSIS — K219 Gastro-esophageal reflux disease without esophagitis: Secondary | ICD-10-CM | POA: Diagnosis not present

## 2016-11-15 DIAGNOSIS — J4540 Moderate persistent asthma, uncomplicated: Secondary | ICD-10-CM

## 2016-11-15 DIAGNOSIS — J3089 Other allergic rhinitis: Secondary | ICD-10-CM

## 2016-11-15 DIAGNOSIS — R053 Chronic cough: Secondary | ICD-10-CM

## 2016-11-15 MED ORDER — FLUTICASONE PROPIONATE HFA 110 MCG/ACT IN AERO: 2.0000 | INHALATION_SPRAY | Freq: Two times a day (BID) | RESPIRATORY_TRACT | 5 refills | Status: DC

## 2016-11-15 MED FILL — FLOVENT HFA 110 MCG INHALER: 110 | 30 days supply | Qty: 12 | Fill #0

## 2016-11-15 NOTE — Assessment & Plan Note (Signed)
Well-controlled.  Continue appropriate left modifications and Dexilant daily.

## 2016-11-15 NOTE — Progress Notes (Signed)
Follow-up Note  RE: Jennifer Dean MRN: [id number] DOB: 11-27-1980 Date of Office Visit: 11/15/2016  Primary care provider: Leamon Arnt, MD Referring provider: Leamon Arnt, MD  History of present illness: Jennifer Dean is a 36 y.o. female with persistent asthma, allergic rhinitis, and history of persistent, presenting today for follow up.  She was last seen in this clinic 04/26/16.  She reports that in April her cough returned.  She restarted Dulera with modest benefit.  She states she then started using essential oils, apply directly to her chest, and the cough resolved.  In July she went to the local urgent care on 2 occasions and to the emergency department once with malaise, dyspnea, and chest tightness.  She reports that Qnasl has helped to control her nasal symptoms and when she ran out of Qnasl her nasal symptoms increased significantly.  She also notes that when she ran out of Dexilant, her reflux symptoms recurred.   Assessment and plan: Asthma  Today's spirometry results, assessed while asymptomatic, suggest under-perception of bronchoconstriction.  A prescription has been provided for Flovent 110 g, one inhalation via spacer device twice a day.  Continue montelukast 10 mg daily at bedtime and albuterol HFA, 1-2 inhalations every 4-6 hours as needed.  Subjective and objective measures of pulmonary function will be followed and the treatment plan will be adjusted accordingly.  Perennial and seasonal allergic rhinitis Stable.  Continue appropriate allergen avoidance measures and Qnasl, one spray per nostril twice daily as needed.  If allergen avoidance measures and medications fail to adequately relieve symptoms, aeroallergen immunotherapy will be considered.  Acid reflux Well-controlled.  Continue appropriate left modifications and Dexilant daily.  Coughing Multifactorial.  Treatment plan as outlined above.   Meds ordered this encounter    Medications  . fluticasone (FLOVENT HFA) 110 MCG/ACT inhaler    Sig: Inhale 2 puffs into the lungs 2 (two) times daily.    Dispense:  2 Inhaler    Refill:  5    Diagnostics: Spirometry reveals an FVC of 2.53 L and an FEV1 of 1.83 L (76% predicted) with significant (230 mL, 13%) post bronchodilator improvement. This study was performed while the patient was asymptomatic.  Please see scanned spirometry results for details.    Physical examination: Blood pressure 120/70, pulse 84, temperature 98.3 F (36.8 C), temperature source Oral, resp. rate 16, height 5' 2.5" (1.588 m), weight 178 lb (80.7 kg), SpO2 97 %.  General: Alert, interactive, in no acute distress. HEENT: TMs pearly gray, turbinates mildly edematous without discharge, post-pharynx mildly erythematous. Neck: Supple without lymphadenopathy. Lungs: Clear to auscultation without wheezing, rhonchi or rales. CV: Normal S1, S2 without murmurs. Skin: Warm and dry, without lesions or rashes.  The following portions of the patient's history were reviewed and updated as appropriate: allergies, current medications, past family history, past medical history, past social history, past surgical history and problem list.  Allergies as of 11/15/2016      Reactions   Morphine And Related Nausea And Vomiting   Codeine Anxiety   Dry mouth, uneasy feeling      Medication List       Accurate as of 11/15/16  6:44 PM. Always use your most recent med list.          albuterol 108 (90 Base) MCG/ACT inhaler Commonly known as:  PROVENTIL HFA;VENTOLIN HFA Inhale 2 puffs into the lungs every 6 (six) hours as needed.   azelastine 0.1 % nasal spray Commonly known as:  ASTELIN Place 1 spray into both nostrils 2 (two) times daily.   Beclomethasone Dipropionate 80 MCG/ACT Aers Commonly known as:  QNASL Place 1 spray into the nose 2 (two) times daily.   DEXILANT 30 MG capsule Generic drug:  Dexlansoprazole TAKE 1 CAPSULE BY MOUTH DAILY.    DULERA 200-5 MCG/ACT Aero Generic drug:  mometasone-formoterol INHALE 2 PUFFS INTO THE LUNGS 2 TIMES DAILY.   escitalopram 10 MG tablet Commonly known as:  LEXAPRO TAKE 1 TABLET BY MOUTH DAILY.   fluticasone 110 MCG/ACT inhaler Commonly known as:  FLOVENT HFA Inhale 2 puffs into the lungs 2 (two) times daily.   ipratropium 0.06 % nasal spray Commonly known as:  ATROVENT Place 2 sprays into both nostrils 4 (four) times daily.   LORazepam 1 MG tablet Commonly known as:  ATIVAN Take 1 mg by mouth daily as needed for anxiety.   montelukast 10 MG tablet Commonly known as:  SINGULAIR Take 10 mg by mouth at bedtime.   omeprazole 20 MG capsule Commonly known as:  PRILOSEC Take 1 capsule (20 mg total) by mouth daily.   TRI-LO-ESTARYLLA 0.18/0.215/0.25 MG-25 MCG tab Generic drug:  Norgestimate-Ethinyl Estradiol Triphasic       Allergies  Allergen Reactions  . Morphine And Related Nausea And Vomiting  . Codeine Anxiety    Dry mouth, uneasy feeling   Review of systems: Review of systems negative except as noted in HPI / PMHx or noted below: Constitutional: Negative.  HENT: Negative.   Eyes: Negative.  Respiratory: Negative.   Cardiovascular: Negative.  Gastrointestinal: Negative.  Genitourinary: Negative.  Musculoskeletal: Negative.  Neurological: Negative.  Endo/Heme/Allergies: Negative.  Cutaneous: Negative.  Past Medical History:  Diagnosis Date  . Asthma   . Eczema   . Vision abnormalities     Family History  Problem Relation Age of Onset  . Hypertension Mother   . GER disease Mother   . Eczema Mother   . Hypertension Father   . Post-traumatic stress disorder Father   . Allergic rhinitis Neg Hx   . Angioedema Neg Hx   . Asthma Neg Hx   . Atopy Neg Hx   . Immunodeficiency Neg Hx   . Urticaria Neg Hx     Social History   Social History  . Marital status: Married    Spouse name: N/A  . Number of children: N/A  . Years of education: N/A    Occupational History  . Not on file.   Social History Main Topics  . Smoking status: Never Smoker  . Smokeless tobacco: Never Used  . Alcohol use No  . Drug use: No  . Sexual activity: Not on file   Other Topics Concern  . Not on file   Social History Narrative  . No narrative on file    I appreciate the opportunity to take part in Casilda's care. Please do not hesitate to contact me with questions.  Sincerely,   R. Edgar Frisk, MD

## 2016-11-15 NOTE — Assessment & Plan Note (Signed)
   Today's spirometry results, assessed while asymptomatic, suggest under-perception of bronchoconstriction.  A prescription has been provided for Flovent 110 g, one inhalation via spacer device twice a day.  Continue montelukast 10 mg daily at bedtime and albuterol HFA, 1-2 inhalations every 4-6 hours as needed.  Subjective and objective measures of pulmonary function will be followed and the treatment plan will be adjusted accordingly.

## 2016-11-15 NOTE — Assessment & Plan Note (Signed)
Multifactorial.    Treatment plan as outlined above. 

## 2016-11-15 NOTE — Patient Instructions (Addendum)
Asthma  Today's spirometry results, assessed while asymptomatic, suggest under-perception of bronchoconstriction.  A prescription has been provided for Flovent 110 g, one inhalation via spacer device twice a day.  Continue montelukast 10 mg daily at bedtime and albuterol HFA, 1-2 inhalations every 4-6 hours as needed.  Subjective and objective measures of pulmonary function will be followed and the treatment plan will be adjusted accordingly.  Perennial and seasonal allergic rhinitis Stable.  Continue appropriate allergen avoidance measures and Qnasl, one spray per nostril twice daily as needed.  If allergen avoidance measures and medications fail to adequately relieve symptoms, aeroallergen immunotherapy will be considered.  Acid reflux Well-controlled.  Continue appropriate left modifications and Dexilant daily.  Coughing Multifactorial.  Treatment plan as outlined above.   Return in about 5 months (around 04/17/2017), or if symptoms worsen or fail to improve.

## 2016-11-15 NOTE — Assessment & Plan Note (Signed)
Stable.  Continue appropriate allergen avoidance measures and Qnasl, one spray per nostril twice daily as needed.  If allergen avoidance measures and medications fail to adequately relieve symptoms, aeroallergen immunotherapy will be considered.

## 2016-11-24 ENCOUNTER — Encounter: Payer: Self-pay | Admitting: Physician Assistant

## 2016-11-24 ENCOUNTER — Other Ambulatory Visit: Payer: Self-pay | Admitting: Emergency Medicine

## 2016-11-24 ENCOUNTER — Ambulatory Visit (INDEPENDENT_AMBULATORY_CARE_PROVIDER_SITE_OTHER): Payer: 59 | Admitting: Physician Assistant

## 2016-11-24 VITALS — BP 122/84 | HR 81 | Temp 98.4°F | Resp 14 | Ht 63.0 in | Wt 176.0 lb

## 2016-11-24 DIAGNOSIS — F418 Other specified anxiety disorders: Secondary | ICD-10-CM | POA: Diagnosis not present

## 2016-11-24 DIAGNOSIS — Z3041 Encounter for surveillance of contraceptive pills: Secondary | ICD-10-CM

## 2016-11-24 MED ORDER — ESCITALOPRAM OXALATE 10 MG PO TABS: 10.0000 mg | ORAL_TABLET | Freq: Every day | ORAL | 5 refills | Status: DC

## 2016-11-24 MED ORDER — NORGESTIM-ETH ESTRAD TRIPHASIC 0.18/0.215/0.25 MG-25 MCG PO TABS: 1.0000 | ORAL_TABLET | Freq: Every day | ORAL | 5 refills | Status: DC

## 2016-11-24 MED ORDER — TRI-LO-ESTARYLLA 0.18/0.215/0.25 MG-25 MCG PO TABS: 1.0000 | ORAL_TABLET | Freq: Every day | ORAL | 5 refills | Status: DC

## 2016-11-24 MED FILL — TRI-LO-MARZIA TABLET: 0.18/0.215/ | 28 days supply | Qty: 28 | Fill #0

## 2016-11-24 MED FILL — ESCITALOPRAM 10 MG TABLET: 10 | 30 days supply | Qty: 30 | Fill #0

## 2016-11-24 NOTE — Addendum Note (Signed)
Addended by: Leonidas Romberg on: 11/24/2016 01:26 PM   Modules accepted: Orders

## 2016-11-24 NOTE — Progress Notes (Signed)
Patient presents to clinic today for medication refills. This is her first appointment at our office. She has been a patient of Dr. Billey Chang at [facility]. She is to be following Dr. Jonni Sanger when she starts with [place] in November.   Depression/Anxiety -- Currently on Lexapro 10 mg each evening. Denies any side effects of medication. Notes improved mood with medication. Denies SI/HI. Denies anxiety.   Contraceptive management -- Currently on Tri-Lo-Estarylla. Has been on for several years. Is used for both contraception and irregular menstrual cycle. Currently on menstrual cycle.     Past Medical History:  Diagnosis Date  . Asthma   . Depression   . Eczema   . History of chickenpox   . Vision abnormalities     Current Outpatient Prescriptions on File Prior to Visit  Medication Sig Dispense Refill  . albuterol (PROVENTIL HFA;VENTOLIN HFA) 108 (90 BASE) MCG/ACT inhaler Inhale 2 puffs into the lungs every 6 (six) hours as needed. 1 Inhaler 3  . azelastine (ASTELIN) 0.1 % nasal spray Place 1 spray into both nostrils 2 (two) times daily.   2  . Beclomethasone Dipropionate (QNASL) 80 MCG/ACT AERS Place 1 spray into the nose 2 (two) times daily. 8.7 g 5  . escitalopram (LEXAPRO) 10 MG tablet TAKE 1 TABLET BY MOUTH DAILY. 30 tablet 11  . fluticasone (FLOVENT HFA) 110 MCG/ACT inhaler Inhale 2 puffs into the lungs 2 (two) times daily. 2 Inhaler 5  . LORazepam (ATIVAN) 1 MG tablet Take 1 mg by mouth daily as needed for anxiety.  0  . montelukast (SINGULAIR) 10 MG tablet Take 10 mg by mouth at bedtime.   11  . omeprazole (PRILOSEC) 20 MG capsule Take 1 capsule (20 mg total) by mouth daily. 30 capsule 0  . TRI-LO-ESTARYLLA 0.18/0.215/0.25 MG-25 MCG tab   11   No current facility-administered medications on file prior to visit.     Allergies  Allergen Reactions  . Morphine And Related Nausea And Vomiting  . Codeine Anxiety    Dry mouth, uneasy feeling    Family History  Problem  Relation Age of Onset  . Hypertension Mother   . GER disease Mother   . Eczema Mother   . Hypertension Father   . Post-traumatic stress disorder Father   . Allergic rhinitis Neg Hx   . Angioedema Neg Hx   . Asthma Neg Hx   . Atopy Neg Hx   . Immunodeficiency Neg Hx   . Urticaria Neg Hx     Social History   Social History  . Marital status: Married    Spouse name: N/A  . Number of children: N/A  . Years of education: N/A   Social History Main Topics  . Smoking status: Never Smoker  . Smokeless tobacco: Never Used  . Alcohol use No  . Drug use: No  . Sexual activity: Not Asked   Other Topics Concern  . None   Social History Narrative  . None   Review of Systems - See HPI.  All other ROS are negative.  BP 122/84   Pulse 81   Temp 98.4 F (36.9 C) (Oral)   Resp 14   Ht 5\' 3"  (1.6 m)   Wt 176 lb (79.8 kg)   SpO2 99%   BMI 31.18 kg/m   Physical Exam  Constitutional: She is oriented to person, place, and time and well-developed, well-nourished, and in no distress.  HENT:  Head: Normocephalic and atraumatic.  Cardiovascular:  Normal rate, regular rhythm, normal heart sounds and intact distal pulses.   Pulmonary/Chest: Effort normal and breath sounds normal. No respiratory distress. She has no wheezes. She has no rales. She exhibits no tenderness.  Neurological: She is alert and oriented to person, place, and time.  Skin: Skin is warm and dry. No rash noted.  Psychiatric: Affect normal.  Vitals reviewed.  Assessment/Plan: 1. Depression with anxiety Stable. Continue current regimen. Medications refilled.  2. Uses oral contraception Patient currently on menstrual cycle. Needs refills for continuation of therapy. Refills granted.  Patient has been scheduled with PCP Dr. Jonni Sanger for routine follow-up in  November. Will give her refills to last until this appointment.     [name], PA-C

## 2016-11-24 NOTE — Progress Notes (Signed)
Pre visit review using our clinic review tool, if applicable. No additional management support is needed unless otherwise documented below in the visit note. 

## 2016-11-24 NOTE — Patient Instructions (Signed)
I have refilled your medications.  Please take as directed. You have a script at [facility] given by [facility] for boric acid crystals to use as directed to help prevent recurrent vaginal issues. Please follow-up with GYN as scheduled.  Schedule a follow-up with Dr. Jonni Sanger for November when she will be here.

## 2016-12-02 MED FILL — QNASL 80 MCG NASAL SPRAY: 80 | 30 days supply | Qty: 9 | Fill #2

## 2016-12-06 MED FILL — MONTELUKAST SOD 10 MG TAB: 10 | 90 days supply | Qty: 90 | Fill #1

## 2016-12-13 ENCOUNTER — Other Ambulatory Visit: Payer: Self-pay | Admitting: Allergy and Immunology

## 2016-12-13 MED FILL — OMEPRAZOLE 20 MG CAP: 20 | 30 days supply | Qty: 30 | Fill #0

## 2016-12-21 MED FILL — TRI-LO-MARZIA TABLET: 0.18/0.215/ | 28 days supply | Qty: 28 | Fill #1

## 2017-01-03 MED FILL — ESCITALOPRAM 10 MG TABLET: 10 | 30 days supply | Qty: 30 | Fill #1

## 2017-01-20 MED FILL — TRI-LO-MARZIA TABLET: 0.18/0.215/ | 28 days supply | Qty: 28 | Fill #2

## 2017-02-02 MED FILL — OMEPRAZOLE 20 MG CAP: 20 | 30 days supply | Qty: 30 | Fill #1

## 2017-02-06 MED FILL — AZELASTINE HCL 137 MCG SPRY: 0.1 | 30 days supply | Qty: 30 | Fill #2

## 2017-02-06 MED FILL — ESCITALOPRAM 10 MG TABLET: 10 | 30 days supply | Qty: 30 | Fill #2

## 2017-02-06 MED FILL — FLOVENT HFA 110 MCG INHALER: 110 | 30 days supply | Qty: 12 | Fill #1

## 2017-02-17 MED FILL — TRI-LO-MARZIA TABLET: 0.18/0.215/ | 84 days supply | Qty: 84 | Fill #3

## 2017-02-17 MED FILL — QNASL 80 MCG NASAL SPRAY: 80 | 30 days supply | Qty: 9 | Fill #3

## 2017-02-28 ENCOUNTER — Ambulatory Visit (INDEPENDENT_AMBULATORY_CARE_PROVIDER_SITE_OTHER): Payer: 59 | Admitting: Family Medicine

## 2017-02-28 ENCOUNTER — Other Ambulatory Visit: Payer: Self-pay

## 2017-02-28 ENCOUNTER — Encounter: Payer: Self-pay | Admitting: Family Medicine

## 2017-02-28 VITALS — BP 120/81 | HR 82 | Temp 99.1°F | Resp 16 | Ht 63.0 in | Wt 188.0 lb

## 2017-02-28 DIAGNOSIS — Z0000 Encounter for general adult medical examination without abnormal findings: Secondary | ICD-10-CM | POA: Diagnosis not present

## 2017-02-28 DIAGNOSIS — Z3041 Encounter for surveillance of contraceptive pills: Secondary | ICD-10-CM

## 2017-02-28 DIAGNOSIS — E669 Obesity, unspecified: Secondary | ICD-10-CM

## 2017-02-28 DIAGNOSIS — F329 Major depressive disorder, single episode, unspecified: Secondary | ICD-10-CM

## 2017-02-28 DIAGNOSIS — F341 Dysthymic disorder: Secondary | ICD-10-CM | POA: Diagnosis not present

## 2017-02-28 DIAGNOSIS — N9489 Other specified conditions associated with female genital organs and menstrual cycle: Secondary | ICD-10-CM | POA: Diagnosis not present

## 2017-02-28 DIAGNOSIS — M6289 Other specified disorders of muscle: Secondary | ICD-10-CM

## 2017-02-28 HISTORY — DX: Other specified conditions associated with female genital organs and menstrual cycle: N94.89

## 2017-02-28 HISTORY — DX: Other specified disorders of muscle: M62.89

## 2017-02-28 LAB — CBC WITH DIFFERENTIAL/PLATELET
BASOS PCT: 0.3 % (ref 0.0–3.0)
Basophils Absolute: 0 10*3/uL (ref 0.0–0.1)
EOS ABS: 0.2 10*3/uL (ref 0.0–0.7)
EOS PCT: 5.1 % — AB (ref 0.0–5.0)
HCT: 40.8 % (ref 36.0–46.0)
Hemoglobin: 13.2 g/dL (ref 12.0–15.0)
LYMPHS ABS: 1.8 10*3/uL (ref 0.7–4.0)
Lymphocytes Relative: 43.4 % (ref 12.0–46.0)
MCHC: 32.3 g/dL (ref 30.0–36.0)
MCV: 91.7 fl (ref 78.0–100.0)
MONO ABS: 0.3 10*3/uL (ref 0.1–1.0)
Monocytes Relative: 7.7 % (ref 3.0–12.0)
NEUTROS PCT: 43.5 % (ref 43.0–77.0)
Neutro Abs: 1.8 10*3/uL (ref 1.4–7.7)
Platelets: 339 10*3/uL (ref 150.0–400.0)
RBC: 4.45 Mil/uL (ref 3.87–5.11)
RDW: 13.8 % (ref 11.5–15.5)
WBC: 4.2 10*3/uL (ref 4.0–10.5)

## 2017-02-28 LAB — LIPID PANEL
CHOLESTEROL: 169 mg/dL (ref 0–200)
HDL: 62.1 mg/dL (ref >39.00)
LDL CALC: 88 mg/dL (ref 0–99)
NonHDL: 106.44
Total CHOL/HDL Ratio: 3
Triglycerides: 94 mg/dL (ref 0.0–149.0)
VLDL: 18.8 mg/dL (ref 0.0–40.0)

## 2017-02-28 LAB — COMPREHENSIVE METABOLIC PANEL
ALT: 8 U/L (ref 0–35)
AST: 16 U/L (ref 0–37)
Albumin: 4 g/dL (ref 3.5–5.2)
Alkaline Phosphatase: 59 U/L (ref 39–117)
BUN: 8 mg/dL (ref 6–23)
CHLORIDE: 104 meq/L (ref 96–112)
CO2: 24 mEq/L (ref 19–32)
Calcium: 8.9 mg/dL (ref 8.4–10.5)
Creatinine, Ser: 0.66 mg/dL (ref 0.40–1.20)
GFR: 130.37 mL/min (ref >60.00)
GLUCOSE: 84 mg/dL (ref 70–99)
POTASSIUM: 4.2 meq/L (ref 3.5–5.1)
SODIUM: 137 meq/L (ref 135–145)
Total Bilirubin: 0.4 mg/dL (ref 0.2–1.2)
Total Protein: 7.2 g/dL (ref 6.0–8.3)

## 2017-02-28 NOTE — Patient Instructions (Signed)
Return in 3 months to assess your weight loss/exercise and nutrition plan and your mood.  Try logging your food, either calories or macors. Avatar Nutrition app MyNetDiary app Look up floor mat exercises.  Please do these things to maintain good health!   Exercise at least 30-45 minutes a day,  4-5 days a week.   Eat a low-fat diet with lots of fruits and vegetables, up to 7-9 servings per day.  Drink plenty of water daily. Try to drink 8 8oz glasses per day.  Seatbelts can save your life. Always wear your seatbelt.  Place Smoke Detectors on every level of your home and check batteries every year.  Schedule an appointment with an eye doctor for an eye exam every 1-2 years  Safe sex - use condoms to protect yourself from STDs if you could be exposed to these types of infections. Use birth control if you do not want to become pregnant and are sexually active.  Avoid heavy alcohol use. If you drink, keep it to less than 2 drinks/day and not every day.  Kenneth.  Choose someone you trust that could speak for you if you became unable to speak for yourself.  Depression is common in our stressful world.If you're feeling down or losing interest in things you normally enjoy, please come in for a visit.  If anyone is threatening or hurting you, please get help. Physical or Emotional Violence is never OK.
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
## 2017-03-01 LAB — HIV ANTIBODY (ROUTINE TESTING W REFLEX): HIV 1&2 Ab, 4th Generation: NONREACTIVE

## 2017-03-07 DIAGNOSIS — H18823 Corneal disorder due to contact lens, bilateral: Secondary | ICD-10-CM | POA: Diagnosis not present

## 2017-03-07 DIAGNOSIS — H10413 Chronic giant papillary conjunctivitis, bilateral: Secondary | ICD-10-CM | POA: Diagnosis not present

## 2017-03-08 MED FILL — LOTEMAX 0.5% GEL: 0.5 | 28 days supply | Qty: 5 | Fill #0

## 2017-03-10 ENCOUNTER — Encounter: Payer: Self-pay | Admitting: Allergy and Immunology

## 2017-03-17 MED FILL — OMEPRAZOLE 20 MG CAP: 20 | 30 days supply | Qty: 30 | Fill #2

## 2017-03-17 MED FILL — ESCITALOPRAM 10 MG TABLET: 10 | 30 days supply | Qty: 30 | Fill #3

## 2017-04-06 MED FILL — MONTELUKAST SOD 10 MG TAB: 10 | 90 days supply | Qty: 90 | Fill #2

## 2017-04-24 ENCOUNTER — Encounter: Payer: Self-pay | Admitting: Allergy and Immunology

## 2017-04-24 ENCOUNTER — Ambulatory Visit (INDEPENDENT_AMBULATORY_CARE_PROVIDER_SITE_OTHER): Payer: No Typology Code available for payment source | Admitting: Allergy and Immunology

## 2017-04-24 VITALS — BP 118/80 | HR 80 | Resp 16

## 2017-04-24 DIAGNOSIS — K219 Gastro-esophageal reflux disease without esophagitis: Secondary | ICD-10-CM

## 2017-04-24 DIAGNOSIS — J4540 Moderate persistent asthma, uncomplicated: Secondary | ICD-10-CM | POA: Diagnosis not present

## 2017-04-24 DIAGNOSIS — J3089 Other allergic rhinitis: Secondary | ICD-10-CM | POA: Diagnosis not present

## 2017-04-24 MED ORDER — AZELASTINE HCL 0.1 % NA SOLN: 1.0000 | Freq: Two times a day (BID) | NASAL | 2 refills | Status: DC

## 2017-04-24 MED ORDER — MONTELUKAST SODIUM 10 MG PO TABS: 10.0000 mg | ORAL_TABLET | Freq: Every day | ORAL | 11 refills | Status: DC

## 2017-04-24 MED ORDER — BECLOMETHASONE DIPROPIONATE 80 MCG/ACT NA AERS: 1.0000 | INHALATION_SPRAY | Freq: Two times a day (BID) | NASAL | 5 refills | Status: DC

## 2017-04-24 MED ORDER — FLUTICASONE PROPIONATE HFA 110 MCG/ACT IN AERO: 2.0000 | INHALATION_SPRAY | Freq: Two times a day (BID) | RESPIRATORY_TRACT | 5 refills | Status: DC

## 2017-04-24 NOTE — Assessment & Plan Note (Signed)
   Appropriate reflux lifestyle modifications have been discussed.  For now, continue omeprazole 20 mg daily.  If her reflux remains well controlled, we will consider switching from omeprazole to ranitidine on her next visit.

## 2017-04-24 NOTE — Assessment & Plan Note (Signed)
   Continue appropriate allergen avoidance measures, Qnasl, and azelastine nasal spray.  Nasal saline spray (i.e., Simply Saline) or nasal saline lavage (i.e., NeilMed) is recommended as needed and prior to medicated nasal sprays.  If allergen avoidance measures and medications fail to adequately relieve symptoms, aeroallergen immunotherapy will be considered.

## 2017-04-24 NOTE — Progress Notes (Signed)
Follow-up Note  RE: Jennifer Dean MRN: [id number] DOB: 01-May-1980 Date of Office Visit: 04/24/2017  Primary care provider: Leamon Arnt, MD Referring provider: Leamon Arnt, MD  History of present illness: Jennifer Dean is a 37 y.o. female with persistent asthma, allergic rhinitis, and gastroesophageal reflux presenting today for a sick visit.  She was last seen in this clinic in August 2018.  She reports that she has been experiencing shortness and chest "heaviness" recently.  It is not enough so as to limit her activities but is bothersome to her.  She admits that she is only using Flovent 2 or 3 times per week because she forgets to use it.  She is taking montelukast 10 mg daily at bedtime and albuterol when needed.  She has been experiencing rhinorrhea and some nasal congestion recently despite compliance with Qnasl and azelastine nasal spray.  Her reflux is currently well controlled with Prilosec.   Assessment and plan: Moderate persistent asthma Currently with suboptimal control.    I have emphasized the importance of using Flovent on a consistent rather than sporadic basis.  She will take Flovent 110 g,  2 inhalations via spacer device twice a day. During respiratory tract infections or asthma flares, increase Flovent 110g to 3 inhalations 3 times per day until symptoms have returned to baseline.  Continue montelukast 10 mg daily at bedtime and albuterol HFA, 1-2 inhalations every 4-6 hours as needed.  Subjective and objective measures of pulmonary function will be followed and the treatment plan will be adjusted accordingly.  Perennial and seasonal allergic rhinitis  Continue appropriate allergen avoidance measures, Qnasl, and azelastine nasal spray.  Nasal saline spray (i.e., Simply Saline) or nasal saline lavage (i.e., NeilMed) is recommended as needed and prior to medicated nasal sprays.  If allergen avoidance measures and medications fail to adequately  relieve symptoms, aeroallergen immunotherapy will be considered.  GERD without esophagitis  Appropriate reflux lifestyle modifications have been discussed.  For now, continue omeprazole 20 mg daily.  If her reflux remains well controlled, we will consider switching from omeprazole to ranitidine on her next visit.   Meds ordered this encounter  Medications  . azelastine (ASTELIN) 0.1 % nasal spray    Sig: Place 1 spray into both nostrils 2 (two) times daily.    Dispense:  30 mL    Refill:  2  . Beclomethasone Dipropionate (QNASL) 80 MCG/ACT AERS    Sig: Place 1 spray into the nose 2 (two) times daily.    Dispense:  8.7 g    Refill:  5    Refill approved. Last refill sent was to a different pharmacy.  . montelukast (SINGULAIR) 10 MG tablet    Sig: Take 1 tablet (10 mg total) by mouth at bedtime.    Dispense:  30 tablet    Refill:  11  . fluticasone (FLOVENT HFA) 110 MCG/ACT inhaler    Sig: Inhale 2 puffs into the lungs 2 (two) times daily.    Dispense:  2 Inhaler    Refill:  5    Diagnostics: Spirometry reveals an FVC of 2.74 L (90% predicted) and an FEV1 of 1.93 L (76% predicted) with 130 mL postbronchodilator improvement.  Please see scanned spirometry results for details.    Physical examination: Blood pressure 118/80, pulse 80, resp. rate 16.  General: Alert, interactive, in no acute distress. HEENT: TMs pearly gray, turbinates moderately edematous without discharge, post-pharynx mildly erythematous. Neck: Supple without lymphadenopathy. Lungs: Clear to auscultation without  wheezing, rhonchi or rales. CV: Normal S1, S2 without murmurs. Skin: Warm and dry, without lesions or rashes.  The following portions of the patient's history were reviewed and updated as appropriate: allergies, current medications, past family history, past medical history, past social history, past surgical history and problem list.  Allergies as of 04/24/2017      Reactions   Morphine And  Related Nausea And Vomiting   Codeine Anxiety   Dry mouth, uneasy feeling      Medication List        Accurate as of 04/24/17  5:30 PM. Always use your most recent med list.          albuterol 108 (90 Base) MCG/ACT inhaler Commonly known as:  PROVENTIL HFA;VENTOLIN HFA Inhale 2 puffs into the lungs every 6 (six) hours as needed.   azelastine 0.1 % nasal spray Commonly known as:  ASTELIN Place 1 spray into both nostrils 2 (two) times daily.   Beclomethasone Dipropionate 80 MCG/ACT Aers Commonly known as:  QNASL Place 1 spray into the nose 2 (two) times daily.   Boric Acid Gran Apply topically.   escitalopram 10 MG tablet Commonly known as:  LEXAPRO Take 1 tablet (10 mg total) by mouth daily.   fluticasone 110 MCG/ACT inhaler Commonly known as:  FLOVENT HFA Inhale 2 puffs into the lungs 2 (two) times daily.   LORazepam 1 MG tablet Commonly known as:  ATIVAN Take 1 mg by mouth daily as needed for anxiety.   montelukast 10 MG tablet Commonly known as:  SINGULAIR Take 1 tablet (10 mg total) by mouth at bedtime.   Norgestimate-Ethinyl Estradiol Triphasic 0.18/0.215/0.25 MG-25 MCG tab Commonly known as:  TRI-LO-ESTARYLLA Take 1 tablet by mouth daily.   omeprazole 20 MG capsule Commonly known as:  PRILOSEC TAKE 1 CAPSULE (20 MG TOTAL) BY MOUTH DAILY.       Allergies  Allergen Reactions  . Morphine And Related Nausea And Vomiting  . Codeine Anxiety    Dry mouth, uneasy feeling   Review of systems: Review of systems negative except as noted in HPI / PMHx or noted below: Constitutional: Negative.  HENT: Negative.   Eyes: Negative.  Respiratory: Negative.   Cardiovascular: Negative.  Gastrointestinal: Negative.  Genitourinary: Negative.  Musculoskeletal: Negative.  Neurological: Negative.  Endo/Heme/Allergies: Negative.  Cutaneous: Negative.  Past Medical History:  Diagnosis Date  . Asthma   . Depression   . Eczema   . 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 History of chickenpox   . Obesity (BMI 30-39.9) 07/26/2012   Overview:  Nl sleep study 2014   . Vision abnormalities     Family History  Problem Relation Age of Onset  . Hypertension Mother   . GER disease Mother   . Eczema Mother   . Hypertension Father   . Post-traumatic stress disorder Father   . Allergic rhinitis Neg Hx   . Angioedema Neg Hx   . Asthma Neg Hx   . Atopy Neg Hx   . Immunodeficiency Neg Hx   . Urticaria Neg Hx     Social History   Socioeconomic History  . Marital status: Married    Spouse name: Not on file  . Number of children: 2  . Years of education: Not on file  . Highest education level: Not on file  Social Needs  . Financial resource strain: Not on file  . Food insecurity - worry: Not on file  .  Food insecurity - inability: Not on file  . Transportation needs - medical: Not on file  . Transportation needs - non-medical: Not on file  Occupational History  . Occupation: Herbalist: [employer]  Tobacco Use  . Smoking status: Never Smoker  . Smokeless tobacco: Never Used  Substance and Sexual Activity  . Alcohol use: No  . Drug use: No  . Sexual activity: Yes    Birth control/protection: Pill  Other Topics Concern  . Not on file  Social History Narrative  . Not on file   Review of systems: Review of systems negative except as noted in HPI / PMHx or noted below: Constitutional: Negative.  HENT: Negative.   Eyes: Negative.  Respiratory: Negative.   Cardiovascular: Negative.  Gastrointestinal: Negative.  Genitourinary: Negative.  Musculoskeletal: Negative.  Neurological: Negative.  Endo/Heme/Allergies: Negative.  Cutaneous: Negative.  Past Medical History:  Diagnosis Date  . Asthma   . Depression   . Eczema   . 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 History of chickenpox   . Obesity (BMI 30-39.9) 07/26/2012   Overview:  Nl  sleep study 2014   . Vision abnormalities     Family History  Problem Relation Age of Onset  . Hypertension Mother   . GER disease Mother   . Eczema Mother   . Hypertension Father   . Post-traumatic stress disorder Father   . Allergic rhinitis Neg Hx   . Angioedema Neg Hx   . Asthma Neg Hx   . Atopy Neg Hx   . Immunodeficiency Neg Hx   . Urticaria Neg Hx     Social History   Socioeconomic History  . Marital status: Married    Spouse name: Not on file  . Number of children: 2  . Years of education: Not on file  . Highest education level: Not on file  Social Needs  . Financial resource strain: Not on file  . Food insecurity - worry: Not on file  . Food insecurity - inability: Not on file  . Transportation needs - medical: Not on file  . Transportation needs - non-medical: Not on file  Occupational History  . Occupation: Herbalist: [employer]  Tobacco Use  . Smoking status: Never Smoker  . Smokeless tobacco: Never Used  Substance and Sexual Activity  . Alcohol use: No  . Drug use: No  . Sexual activity: Yes    Birth control/protection: Pill  Other Topics Concern  . Not on file  Social History Narrative  . Not on file    I appreciate the opportunity to take part in Alveria's care. Please do not hesitate to contact me with questions.  Sincerely,   R. Edgar Frisk, MD

## 2017-04-24 NOTE — Assessment & Plan Note (Addendum)
Currently with suboptimal control.    I have emphasized the importance of using Flovent on a consistent rather than sporadic basis.  She will take Flovent 110 g, 2 inhalations via spacer device twice a day. During respiratory tract infections or asthma flares, increase Flovent 110g to 3 inhalations 3 times per day until symptoms have returned to baseline.  Continue montelukast 10 mg daily at bedtime and albuterol HFA, 1-2 inhalations every 4-6 hours as needed.  Subjective and objective measures of pulmonary function will be followed and the treatment plan will be adjusted accordingly.

## 2017-04-24 NOTE — Patient Instructions (Addendum)
Moderate persistent asthma Currently with suboptimal control.    I have emphasized the importance of using Flovent on a consistent rather than sporadic basis.  She will take Flovent 110 g,  2 inhalations via spacer device twice a day. During respiratory tract infections or asthma flares, increase Flovent 110g to 3 inhalations 3 times per day until symptoms have returned to baseline.  Continue montelukast 10 mg daily at bedtime and albuterol HFA, 1-2 inhalations every 4-6 hours as needed.  Subjective and objective measures of pulmonary function will be followed and the treatment plan will be adjusted accordingly.  Perennial and seasonal allergic rhinitis  Continue appropriate allergen avoidance measures, Qnasl, and azelastine nasal spray.  Nasal saline spray (i.e., Simply Saline) or nasal saline lavage (i.e., NeilMed) is recommended as needed and prior to medicated nasal sprays.  If allergen avoidance measures and medications fail to adequately relieve symptoms, aeroallergen immunotherapy will be considered.  GERD without esophagitis  Appropriate reflux lifestyle modifications have been discussed.  For now, continue omeprazole 20 mg daily.  If her reflux remains well controlled, we will consider switching from omeprazole to ranitidine on her next visit.   Return in about 4 months (around 08/22/2017), or if symptoms worsen or fail to improve.

## 2017-04-25 MED FILL — ESCITALOPRAM 10 MG TABLET: 10 | 30 days supply | Qty: 30 | Fill #4

## 2017-05-04 ENCOUNTER — Other Ambulatory Visit: Payer: Self-pay | Admitting: Physician Assistant

## 2017-05-04 ENCOUNTER — Encounter: Payer: Self-pay | Admitting: Nurse Practitioner

## 2017-05-04 ENCOUNTER — Ambulatory Visit (INDEPENDENT_AMBULATORY_CARE_PROVIDER_SITE_OTHER): Payer: Self-pay | Admitting: Nurse Practitioner

## 2017-05-04 VITALS — BP 118/82 | HR 86 | Temp 98.4°F | Resp 18 | Wt 194.0 lb

## 2017-05-04 DIAGNOSIS — H6692 Otitis media, unspecified, left ear: Secondary | ICD-10-CM

## 2017-05-04 DIAGNOSIS — Z3041 Encounter for surveillance of contraceptive pills: Secondary | ICD-10-CM

## 2017-05-04 MED ORDER — AMOXICILLIN 875 MG PO TABS: 875.0000 mg | ORAL_TABLET | Freq: Two times a day (BID) | ORAL | 0 refills | Status: AC

## 2017-05-04 MED FILL — AMOXICILLIN 875 MG TABLET: 875 | 10 days supply | Qty: 20 | Fill #0

## 2017-05-04 MED FILL — LOTEMAX 0.5% GEL: 0.5 | 28 days supply | Qty: 5 | Fill #1

## 2017-05-04 MED FILL — OMEPRAZOLE 20 MG CAP: 20 | 30 days supply | Qty: 30 | Fill #3

## 2017-05-04 MED FILL — QNASL 80 MCG NASAL SPRAY: 80 | 30 days supply | Qty: 9 | Fill #4

## 2017-05-04 NOTE — Progress Notes (Signed)
Subjective:     Jennifer Dean is a 37 y.o. female who presents with ear pain and possible ear infection. Symptoms include: left ear pain and nasal congestion. Onset of symptoms was 2 weeks ago, and have been gradually worsening since that time. Associated symptoms include: none.  Patient denies: chills, fever , headache, productive cough, sinus pressure, sneezing and sore throat. She is drinking plenty of fluids. Asthma has been well controlled per patient.   The following portions of the patient's history were reviewed and updated as appropriate: allergies, current medications and past medical history.  Review of Systems Constitutional: negative Eyes: negative Ears, nose, mouth, throat, and face: positive for earaches and nasal congestion, negative for ear drainage, hoarseness and sore throat Respiratory: positive for asthma, negative for cough, sputum, stridor and wheezing Cardiovascular: negative Neurological: negative Behavioral/Psych: negative Allergic/Immunologic: positive for hay fever   Objective:    BP 118/82 (BP Location: Right Arm, Patient Position: Sitting, Cuff Size: Normal)   Pulse 86   Temp 98.4 F (36.9 C) (Oral)   Resp 18   Wt 194 lb (88 kg)   SpO2 100%   BMI 34.37 kg/m  General:  alert, cooperative and no distress  Right Ear: normal and TM opaque, able to visualize landmarks  Left Ear: TM erythematous, bulging  Mouth:  lips, mucosa, and tongue normal; teeth and gums normal  Neck: no adenopathy, no carotid bruit, no JVD, supple, symmetrical, trachea midline and thyroid not enlarged, symmetric, no tenderness/mass/nodules     Assessment:    Left acute otitis media   Plan:    Treatment: Amoxicillin. OTC analgesia as needed. Fluids, rest, avoid carbonated/alcoholic and caffeinated beverages.  Follow up in 3 days if not improving.

## 2017-05-04 NOTE — Patient Instructions (Addendum)

## 2017-05-06 ENCOUNTER — Telehealth: Payer: Self-pay

## 2017-05-16 MED FILL — METHYLPREDNISOLONE 4 MG TAB: 4 | 6 days supply | Qty: 21 | Fill #0

## 2017-05-16 MED FILL — DOXYCYCLINE HYCLATE 100 MG: 100 | 10 days supply | Qty: 20 | Fill #0

## 2017-05-16 MED FILL — TRI-LO-MARZIA TABLET: 0.18/0.215/ | 84 days supply | Qty: 84 | Fill #0

## 2017-05-30 ENCOUNTER — Encounter: Payer: Self-pay | Admitting: Family Medicine

## 2017-05-30 ENCOUNTER — Other Ambulatory Visit: Payer: Self-pay

## 2017-05-30 ENCOUNTER — Ambulatory Visit (INDEPENDENT_AMBULATORY_CARE_PROVIDER_SITE_OTHER): Payer: No Typology Code available for payment source | Admitting: Family Medicine

## 2017-05-30 VITALS — BP 108/74 | HR 78 | Temp 98.3°F | Resp 16 | Ht 62.5 in | Wt 190.0 lb

## 2017-05-30 DIAGNOSIS — F329 Major depressive disorder, single episode, unspecified: Secondary | ICD-10-CM

## 2017-05-30 DIAGNOSIS — F341 Dysthymic disorder: Secondary | ICD-10-CM

## 2017-05-30 DIAGNOSIS — E669 Obesity, unspecified: Secondary | ICD-10-CM

## 2017-05-30 NOTE — Patient Instructions (Signed)
Please return in 3 months for recheck.  If you have any questions or concerns, please don't hesitate to send me a message via MyChart or call the office at [phone]. Thank you for visiting with [place] today! It's our pleasure caring for you.  Lean cuisines Jumping jacks, air squats, walking  Fruit, hummus, water.
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
## 2017-06-05 MED FILL — ESCITALOPRAM 10 MG TABLET: 10 | 30 days supply | Qty: 30 | Fill #5

## 2017-06-14 ENCOUNTER — Other Ambulatory Visit: Payer: Self-pay | Admitting: Allergy and Immunology

## 2017-06-14 MED FILL — OMEPRAZOLE 20 MG CAP: 20 | 30 days supply | Qty: 30 | Fill #0

## 2017-06-21 MED FILL — AZELASTINE HCL 137 MCG SPRY: 0.1 | 30 days supply | Qty: 30 | Fill #0

## 2017-06-21 MED FILL — FLOVENT HFA 110 MCG INHALER: 110 | 30 days supply | Qty: 12 | Fill #2

## 2017-07-05 ENCOUNTER — Encounter: Payer: Self-pay | Admitting: Family Medicine

## 2017-07-19 ENCOUNTER — Other Ambulatory Visit: Payer: Self-pay | Admitting: Physician Assistant

## 2017-07-19 MED FILL — ESCITALOPRAM 10 MG TABLET: 10 | 30 days supply | Qty: 30 | Fill #0

## 2017-07-26 MED FILL — OMEPRAZOLE 20 MG CAP: 20 | 30 days supply | Qty: 30 | Fill #1

## 2017-07-31 MED FILL — QNASL 80 MCG NASAL SPRAY: 80 | 30 days supply | Qty: 9 | Fill #0

## 2017-08-03 MED FILL — MONTELUKAST SOD 10 MG TAB: 10 | 30 days supply | Qty: 30 | Fill #0

## 2017-08-03 MED FILL — NORG-EE 0.18-0.215-0.25/0.0: 0.18/0.215/ | 84 days supply | Qty: 84 | Fill #1

## 2017-08-17 ENCOUNTER — Encounter: Payer: Self-pay | Admitting: Family Medicine

## 2017-08-17 DIAGNOSIS — J4540 Moderate persistent asthma, uncomplicated: Secondary | ICD-10-CM

## 2017-08-21 ENCOUNTER — Ambulatory Visit: Payer: No Typology Code available for payment source | Admitting: Allergy and Immunology

## 2017-08-21 ENCOUNTER — Encounter: Payer: Self-pay | Admitting: Allergy and Immunology

## 2017-08-21 VITALS — BP 120/72 | HR 87 | Resp 18

## 2017-08-21 DIAGNOSIS — J3089 Other allergic rhinitis: Secondary | ICD-10-CM

## 2017-08-21 DIAGNOSIS — K219 Gastro-esophageal reflux disease without esophagitis: Secondary | ICD-10-CM

## 2017-08-21 DIAGNOSIS — J4540 Moderate persistent asthma, uncomplicated: Secondary | ICD-10-CM | POA: Diagnosis not present

## 2017-08-21 MED ORDER — BUDESONIDE-FORMOTEROL FUMARATE 160-4.5 MCG/ACT IN AERO: 2.0000 | INHALATION_SPRAY | Freq: Two times a day (BID) | RESPIRATORY_TRACT | 5 refills | Status: DC

## 2017-08-21 MED ORDER — ALBUTEROL SULFATE HFA 108 (90 BASE) MCG/ACT IN AERS: 2.0000 | INHALATION_SPRAY | RESPIRATORY_TRACT | 5 refills | Status: DC | PRN

## 2017-08-21 NOTE — Progress Notes (Signed)
Follow-up Note  RE: Jennifer CHARNLEY MRN: [id number] DOB: 1980/08/17 Date of Office Visit: 08/21/2017  Primary care provider: Leamon Arnt, MD Referring provider: Leamon Arnt, MD  History of present illness: Jennifer Dean is a 37 y.o. female with persistent asthma, allergic rhinitis, and gastroesophageal reflux presenting today for follow-up.  She was last seen in this clinic on April 24, 2017.  She reports that despite compliance with Flovent 110 g, 2 inhalations via spacer device twice daily, and montelukast 10 mg daily at bedtime she is still experiences lower respiratory symptoms daily, particularly with mild/moderate exertion such as one flight of stairs.  Albuterol provides immediate relief, however she attempts to rest and "let it pass" rather than use albuterol daily.  She reports that her albuterol inhaler has expired and she needs a refill.  She experiences occasional/rare rhinorrhea, however overall her nasal allergy symptoms are well controlled.  Assessment and plan: Moderate persistent asthma Currently with suboptimal control.  We will step up therapy at this time.  A prescription has been provided for Symbicort (budesonide/formoterol) 160/4.5 g,  2 inhalations via spacer device twice a day.  For now, discontinue Flovent.  Continue montelukast 10 mg daily bedtime and albuterol HFA, 1 to 2 inhalations every 6 hours if needed and 15 minutes prior to exercise.  The patient will contact me if her symptoms persist or progress. Otherwise, she may return for follow up in 4 months.  Perennial and seasonal allergic rhinitis Stable.  Continue appropriate allergen avoidance measures, montelukast 10 mg daily, and azelastine nasal spray if needed.  Nasal saline spray (i.e., Simply Saline) or nasal saline lavage (i.e., NeilMed) is recommended as needed and prior to medicated nasal sprays.  If allergen avoidance measures and medications fail to adequately relieve  symptoms, aeroallergen immunotherapy will be considered.  GERD without esophagitis  Appropriate reflux lifestyle modifications have been discussed.  For now, continue omeprazole 20 mg daily.  If her reflux remains stable/well controlled, we will consider switching from omeprazole to ranitidine on her next visit.   Meds ordered this encounter  Medications  . budesonide-formoterol (SYMBICORT) 160-4.5 MCG/ACT inhaler    Sig: Inhale 2 puffs into the lungs 2 (two) times daily.    Dispense:  1 Inhaler    Refill:  5  . albuterol (VENTOLIN HFA) 108 (90 Base) MCG/ACT inhaler    Sig: Inhale 2 puffs into the lungs every 4 (four) hours as needed for wheezing or shortness of breath.    Dispense:  1 Inhaler    Refill:  5    Diagnostics: Spirometry reveals an FVC of 2.86 L and an FEV1 of 2.09 L (81% predicted) and an FEV1 ratio of 89%.  This study was performed while the patient was asymptomatic.  Please see scanned spirometry results for details.    Physical examination: Blood pressure 120/72, pulse 87, resp. rate 18, SpO2 97 %.  General: Alert, interactive, in no acute distress. HEENT: TMs pearly gray, turbinates mildly edematous without discharge, post-pharynx mildly erythematous. Neck: Supple without lymphadenopathy. Lungs: Clear to auscultation without wheezing, rhonchi or rales. CV: Normal S1, S2 without murmurs. Skin: Warm and dry, without lesions or rashes.  The following portions of the patient's history were reviewed and updated as appropriate: allergies, current medications, past family history, past medical history, past social history, past surgical history and problem list.  Allergies as of 08/21/2017      Reactions   Morphine And Related Nausea And Vomiting   Codeine  Anxiety   Dry mouth, uneasy feeling      Medication List        Accurate as of 08/21/17  7:47 PM. Always use your most recent med list.          albuterol 108 (90 Base) MCG/ACT inhaler Commonly known  as:  VENTOLIN HFA Inhale 2 puffs into the lungs every 4 (four) hours as needed for wheezing or shortness of breath.   azelastine 0.1 % nasal spray Commonly known as:  ASTELIN Place 1 spray into both nostrils 2 (two) times daily.   Beclomethasone Dipropionate 80 MCG/ACT Aers Commonly known as:  QNASL Place 1 spray into the nose 2 (two) times daily.   Boric Acid Gran Apply topically.   budesonide-formoterol 160-4.5 MCG/ACT inhaler Commonly known as:  SYMBICORT Inhale 2 puffs into the lungs 2 (two) times daily.   escitalopram 10 MG tablet Commonly known as:  LEXAPRO TAKE 1 TABLET (10 MG TOTAL) BY MOUTH DAILY.   fluticasone 110 MCG/ACT inhaler Commonly known as:  FLOVENT HFA Inhale 2 puffs into the lungs 2 (two) times daily.   montelukast 10 MG tablet Commonly known as:  SINGULAIR Take 1 tablet (10 mg total) by mouth at bedtime.   omeprazole 20 MG capsule Commonly known as:  PRILOSEC TAKE 1 CAPSULE (20 MG TOTAL) BY MOUTH DAILY.   TRI-LO-MARZIA 0.18/0.215/0.25 MG-25 MCG tab Generic drug:  Norgestimate-Ethinyl Estradiol Triphasic TAKE 1 TABLET BY MOUTH DAILY.       Allergies  Allergen Reactions  . Morphine And Related Nausea And Vomiting  . Codeine Anxiety    Dry mouth, uneasy feeling   Review of systems: Review of systems negative except as noted in HPI / PMHx or noted below: Constitutional: Negative.  HENT: Negative.   Eyes: Negative.  Respiratory: Negative.   Cardiovascular: Negative.  Gastrointestinal: Negative.  Genitourinary: Negative.  Musculoskeletal: Negative.  Neurological: Negative.  Endo/Heme/Allergies: Negative.  Cutaneous: Negative.  Past Medical History:  Diagnosis Date  . Asthma   . Depression   . Eczema   . 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 History of chickenpox   . Obesity (BMI 30-39.9) 07/26/2012   Overview:  Nl sleep study 2014   . Vision abnormalities     Family History  Problem Relation  Age of Onset  . Hypertension Mother   . GER disease Mother   . Eczema Mother   . Hypertension Father   . Post-traumatic stress disorder Father   . Allergic rhinitis Neg Hx   . Angioedema Neg Hx   . Asthma Neg Hx   . Atopy Neg Hx   . Immunodeficiency Neg Hx   . Urticaria Neg Hx     Social History   Socioeconomic History  . Marital status: Legally Separated    Spouse name: Not on file  . Number of children: 2  . Years of education: Not on file  . Highest education level: Not on file  Occupational History  . Occupation: Herbalist: [employer]  . Financial resource strain: Not on file  . Food insecurity:    Worry: Not on file    Inability: Not on file  . Transportation needs:    Medical: Not on file    Non-medical: Not on file  Tobacco Use  . Smoking status: Never Smoker  . Smokeless tobacco: Never Used  Substance and Sexual Activity  . Alcohol use: No  .  Drug use: No  . Sexual activity: Yes    Birth control/protection: Pill  Lifestyle  . Physical activity:    Days per week: Not on file    Minutes per session: Not on file  . Stress: Not on file  Relationships  . Social connections:    Talks on phone: Not on file    Gets together: Not on file    Attends religious service: Not on file    Active member of club or organization: Not on file    Attends meetings of clubs or organizations: Not on file    Relationship status: Not on file  . Intimate partner violence:    Fear of current or ex partner: Not on file    Emotionally abused: Not on file    Physically abused: Not on file    Forced sexual activity: Not on file  Other Topics Concern  . Not on file  Social History Narrative  . Not on file    I appreciate the opportunity to take part in Ysabelle's care. Please do not hesitate to contact me with questions.  Sincerely,   R. Edgar Frisk, MD

## 2017-08-21 NOTE — Assessment & Plan Note (Signed)
   Appropriate reflux lifestyle modifications have been discussed.  For now, continue omeprazole 20 mg daily.  If her reflux remains stable/well controlled, we will consider switching from omeprazole to ranitidine on her next visit.

## 2017-08-21 NOTE — Assessment & Plan Note (Signed)
Currently with suboptimal control.  We will step up therapy at this time.  A prescription has been provided for Symbicort (budesonide/formoterol) 160/4.5 g, 2 inhalations via spacer device twice a day.  For now, discontinue Flovent.  Continue montelukast 10 mg daily bedtime and albuterol HFA, 1 to 2 inhalations every 6 hours if needed and 15 minutes prior to exercise.  The patient will contact me if her symptoms persist or progress. Otherwise, she may return for follow up in 4 months.

## 2017-08-21 NOTE — Assessment & Plan Note (Signed)
Stable.  Continue appropriate allergen avoidance measures, montelukast 10 mg daily, and azelastine nasal spray if needed.  Nasal saline spray (i.e., Simply Saline) or nasal saline lavage (i.e., NeilMed) is recommended as needed and prior to medicated nasal sprays.  If allergen avoidance measures and medications fail to adequately relieve symptoms, aeroallergen immunotherapy will be considered. 

## 2017-08-21 NOTE — Patient Instructions (Signed)
Moderate persistent asthma Currently with suboptimal control.  We will step up therapy at this time.  A prescription has been provided for Symbicort (budesonide/formoterol) 160/4.5 g,  2 inhalations via spacer device twice a day.  For now, discontinue Flovent.  Continue montelukast 10 mg daily bedtime and albuterol HFA, 1 to 2 inhalations every 6 hours if needed and 15 minutes prior to exercise.  The patient will contact me if her symptoms persist or progress. Otherwise, she may return for follow up in 4 months.  Perennial and seasonal allergic rhinitis Stable.  Continue appropriate allergen avoidance measures, montelukast 10 mg daily, and azelastine nasal spray if needed.  Nasal saline spray (i.e., Simply Saline) or nasal saline lavage (i.e., NeilMed) is recommended as needed and prior to medicated nasal sprays.  If allergen avoidance measures and medications fail to adequately relieve symptoms, aeroallergen immunotherapy will be considered.  GERD without esophagitis  Appropriate reflux lifestyle modifications have been discussed.  For now, continue omeprazole 20 mg daily.  If her reflux remains stable/well controlled, we will consider switching from omeprazole to ranitidine on her next visit.   Return in about 4 months (around 12/22/2017), or if symptoms worsen or fail to improve.

## 2017-08-22 MED FILL — ESCITALOPRAM 10 MG TABLET: 10 | 30 days supply | Qty: 30 | Fill #1

## 2017-08-22 MED FILL — VENTOLIN HFA 90 MCG INHALER: 108 (90 BAS | 17 days supply | Qty: 18 | Fill #0

## 2017-08-22 MED FILL — SYMBICORT 160-4.5 MCG INH: 160-4.5 | 30 days supply | Qty: 10 | Fill #0

## 2017-08-30 ENCOUNTER — Ambulatory Visit (INDEPENDENT_AMBULATORY_CARE_PROVIDER_SITE_OTHER): Payer: No Typology Code available for payment source | Admitting: Family Medicine

## 2017-08-30 ENCOUNTER — Encounter: Payer: Self-pay | Admitting: Family Medicine

## 2017-08-30 ENCOUNTER — Other Ambulatory Visit (HOSPITAL_COMMUNITY)
Admission: RE | Admit: 2017-08-30 | Discharge: 2017-08-30 | Disposition: A | Discharge status: Home / Self Care | Payer: No Typology Code available for payment source | Source: Ambulatory Visit | Attending: Family Medicine | Admitting: Family Medicine

## 2017-08-30 ENCOUNTER — Other Ambulatory Visit: Payer: Self-pay

## 2017-08-30 VITALS — BP 122/82 | HR 73 | Temp 98.5°F | Ht 62.5 in | Wt 190.6 lb

## 2017-08-30 DIAGNOSIS — G250 Essential tremor: Secondary | ICD-10-CM

## 2017-08-30 DIAGNOSIS — F329 Major depressive disorder, single episode, unspecified: Secondary | ICD-10-CM | POA: Diagnosis not present

## 2017-08-30 DIAGNOSIS — N898 Other specified noninflammatory disorders of vagina: Secondary | ICD-10-CM | POA: Diagnosis not present

## 2017-08-30 NOTE — Patient Instructions (Signed)
Please return in November for your annual complete physical; please come fasting.  Schedule counseling!  If you have any questions or concerns, please don't hesitate to send me a message via MyChart or call the office at [phone]. Thank you for visiting with [place] today! It's our pleasure caring for you.  Essential Tremor A tremor is trembling or shaking that you cannot control. Most tremors affect the hands or arms. Tremors can also affect the head, vocal cords, face, and other parts of the body. Essential tremor is a tremor without a known cause. What are the causes? Essential tremor has no known cause. What increases the risk? You may be at greater risk of essential tremor if:  You have a family member with essential tremor.  You are age 12 or older.  You take certain medicines.  What are the signs or symptoms? The main sign of a tremor is uncontrolled and unintentional rhythmic shaking of a body part.  You may have difficulty eating with a spoon or fork.  You may have difficulty writing.  You may nod your head up and down or side to side.  You may have a quivering voice.  Your tremors:  May get worse over time.  May come and go.  May be more noticeable on one side of your body.  May get worse due to stress, fatigue, caffeine, and extreme heat or cold.  How is this diagnosed? Your health care provider can diagnose essential tremor based on your symptoms, medical history, and a physical examination. There is no single test to diagnose an essential tremor. However, your health care provider may perform a variety of tests to rule out other conditions. Tests may include:  Blood and urine tests.  Imaging studies of your brain, such as: ? CT scan. ? MRI.  A test that measures involuntary muscle movement (electromyogram).  How is this treated? Your tremors may go away without treatment. Mild tremors may not need treatment if they do not affect your day-to-day life.  Severe tremors may need to be treated using one or a combination of the following options:  Medicines. This may include medicine that is injected.  Lifestyle changes.  Physical therapy.  Follow these instructions at home:  Take medicines only as directed by your health care provider.  Limit alcohol intake to no more than 1 drink per day for nonpregnant women and 2 drinks per day for men. One drink equals 12 oz of beer, 5 oz of wine, or 1 oz of hard liquor.  Do not use any tobacco products, including cigarettes, chewing tobacco, or electronic cigarettes. If you need help quitting, ask your health care provider.  Take medicines only as directed by your health care provider.  Avoid extreme heat or cold.  Limit the amount of caffeine you consumeas directed by your health care provider.  Try to get eight hours of sleep each night.  Find ways to manage your stress, such as meditation or yoga.  Keep all follow-up visits as directed by your health care provider. This is important. This includes any physical therapy visits. Contact a health care provider if:  You experience any changes in the location or intensity of your tremors.  You start having a tremor after starting a new medicine.  You have tremor with other symptoms such as: ? Numbness. ? Tingling. ? Pain. ? Weakness.  Your tremor gets worse.  Your tremor interferes with your daily life. This information is not intended to replace advice given  to you by your health care provider. Make sure you discuss any questions you have with your health care provider. Document Released: 04/11/2014 Document Revised: 08/27/2015 Document Reviewed: 09/16/2013 Elsevier Interactive Patient Education  [name].
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
## 2017-08-31 LAB — CERVICOVAGINAL ANCILLARY ONLY
BACTERIAL VAGINITIS: NEGATIVE
CANDIDA VAGINITIS: POSITIVE — AB
Trichomonas: NEGATIVE

## 2017-09-01 MED ORDER — FLUCONAZOLE 150 MG PO TABS: ORAL_TABLET | ORAL | 0 refills | Status: DC

## 2017-09-01 MED FILL — FLUCONAZOLE 150 MG TABS: 150 | 2 days supply | Qty: 2 | Fill #0

## 2017-09-01 NOTE — Addendum Note (Signed)
Addended by: Billey Chang on: 09/01/2017 08:26 AM   Modules accepted: Orders

## 2017-09-06 MED FILL — OMEPRAZOLE 20 MG CAP: 20 | 30 days supply | Qty: 30 | Fill #2

## 2017-09-06 MED FILL — MONTELUKAST SOD 10 MG TAB: 10 | 30 days supply | Qty: 30 | Fill #1

## 2017-09-21 MED FILL — AZELASTINE HCL 137 MCG SPRY: 0.1 | 30 days supply | Qty: 30 | Fill #1

## 2017-09-21 MED FILL — ESCITALOPRAM 10 MG TABLET: 10 | 30 days supply | Qty: 30 | Fill #2

## 2017-10-04 MED FILL — QNASL 80 MCG/ACT AERS: 80 | 30 days supply | Qty: 11 | Fill #1

## 2017-10-09 MED FILL — MONTELUKAST SOD 10 MG TAB: 10 | 30 days supply | Qty: 30 | Fill #2

## 2017-10-09 MED FILL — OMEPRAZOLE 20 MG CAP: 20 | 30 days supply | Qty: 30 | Fill #3

## 2017-10-25 MED FILL — NORG-EE 0.18-0.215-0.25/0.0: 0.18/0.215/ | 84 days supply | Qty: 84 | Fill #2

## 2017-10-25 MED FILL — ESCITALOPRAM 10 MG TABLET: 10 | 30 days supply | Qty: 30 | Fill #3

## 2017-11-08 ENCOUNTER — Other Ambulatory Visit: Payer: Self-pay | Admitting: Allergy and Immunology

## 2017-11-08 MED FILL — OMEPRAZOLE 20 MG CAP: 20 | 30 days supply | Qty: 30 | Fill #0

## 2017-11-08 MED FILL — MONTELUKAST SOD 10 MG TAB: 10 | 30 days supply | Qty: 30 | Fill #3

## 2017-11-27 MED FILL — ESCITALOPRAM 10 MG TABLET: 10 | 30 days supply | Qty: 30 | Fill #4

## 2017-12-05 MED FILL — QNASL 80 MCG/ACT AERS: 80 | 30 days supply | Qty: 11 | Fill #2

## 2017-12-06 ENCOUNTER — Encounter: Payer: Self-pay | Admitting: Family Medicine

## 2017-12-06 NOTE — Telephone Encounter (Signed)
Forms placed on Dr. Andy's Desk.   Hudsyn Champine,  LPN   

## 2017-12-07 ENCOUNTER — Encounter: Payer: Self-pay | Admitting: Family Medicine

## 2017-12-13 MED FILL — MONTELUKAST SOD 10 MG TAB: 10 | 30 days supply | Qty: 30 | Fill #4

## 2017-12-13 MED FILL — OMEPRAZOLE 20 MG CPDR: 20 | 30 days supply | Qty: 30 | Fill #1

## 2017-12-19 ENCOUNTER — Encounter: Payer: Self-pay | Admitting: Allergy and Immunology

## 2017-12-19 ENCOUNTER — Ambulatory Visit (INDEPENDENT_AMBULATORY_CARE_PROVIDER_SITE_OTHER): Payer: No Typology Code available for payment source | Admitting: Allergy and Immunology

## 2017-12-19 VITALS — BP 124/76 | HR 72 | Resp 12 | Ht 62.5 in

## 2017-12-19 DIAGNOSIS — J3089 Other allergic rhinitis: Secondary | ICD-10-CM | POA: Diagnosis not present

## 2017-12-19 DIAGNOSIS — J4540 Moderate persistent asthma, uncomplicated: Secondary | ICD-10-CM

## 2017-12-19 DIAGNOSIS — K219 Gastro-esophageal reflux disease without esophagitis: Secondary | ICD-10-CM | POA: Diagnosis not present

## 2017-12-19 MED ORDER — BUDESONIDE-FORMOTEROL FUMARATE 160-4.5 MCG/ACT IN AERO: 2.0000 | INHALATION_SPRAY | Freq: Two times a day (BID) | RESPIRATORY_TRACT | 5 refills | Status: DC

## 2017-12-19 MED ORDER — AZELASTINE HCL 0.1 % NA SOLN: 1.0000 | Freq: Two times a day (BID) | NASAL | 5 refills | Status: DC

## 2017-12-19 MED ORDER — BECLOMETHASONE DIPROPIONATE 80 MCG/ACT NA AERS: 1.0000 | INHALATION_SPRAY | Freq: Two times a day (BID) | NASAL | 5 refills | Status: DC

## 2017-12-19 MED ORDER — OMEPRAZOLE 20 MG PO CPDR: 20.0000 mg | DELAYED_RELEASE_CAPSULE | Freq: Every day | ORAL | 5 refills | Status: DC

## 2017-12-19 NOTE — Assessment & Plan Note (Signed)
Stable.  Continue appropriate allergen avoidance measures, montelukast 10 mg daily, and azelastine nasal spray if needed.  Nasal saline spray (i.e., Simply Saline) or nasal saline lavage (i.e., NeilMed) is recommended as needed and prior to medicated nasal sprays.  If allergen avoidance measures and medications fail to adequately relieve symptoms, aeroallergen immunotherapy will be considered.

## 2017-12-19 NOTE — Assessment & Plan Note (Signed)
Well controlled.  For now, continue Symbicort (budesonide/formoterol) 160/4.5 g, 2 inhalations via spacer device twice a day, montelukast 10 mg daily bedtime and albuterol HFA, 1 to 2 inhalations every 6 hours if needed and 15 minutes prior to exercise.  Subjective and objective measures of pulmonary function will be followed and the treatment plan will be adjusted accordingly.

## 2017-12-19 NOTE — Progress Notes (Signed)
Follow-up Note  RE: Jennifer Dean MRN: [id number] DOB: 02-Dec-1980 Date of Office Visit: 12/19/2017  Primary care provider: Leamon Arnt, MD Referring provider: Leamon Arnt, MD  History of present illness: Jennifer Dean is a 37 y.o. female with persistent asthma, allergic rhinitis, and GERD presenting today for follow-up.  She reports that in the interval since her previous visit her asthma has improved and is well controlled on Symbicort 160-4.5 g, 2 inhalations via spacer device twice daily, and montelukast 10 mg daily at bedtime.  She uses albuterol prior to exercise but rarely requires albuterol rescue, denies limitations in her normal daily activities, and denies nocturnal awakenings due to lower respiratory symptoms.  Her nasal allergy symptoms are well controlled with as needed allergy medication.  Her reflux is currently well controlled with omeprazole, however if she misses doses of this medication her reflux returns.  Assessment and plan: Moderate persistent asthma Well controlled.  For now, continue Symbicort (budesonide/formoterol) 160/4.5 g, 2 inhalations via spacer device twice a day, montelukast 10 mg daily bedtime and albuterol HFA, 1 to 2 inhalations every 6 hours if needed and 15 minutes prior to exercise.  Subjective and objective measures of pulmonary function will be followed and the treatment plan will be adjusted accordingly.  Perennial and seasonal allergic rhinitis Stable.  Continue appropriate allergen avoidance measures, montelukast 10 mg daily, and azelastine nasal spray if needed.  Nasal saline spray (i.e., Simply Saline) or nasal saline lavage (i.e., NeilMed) is recommended as needed and prior to medicated nasal sprays.  If allergen avoidance measures and medications fail to adequately relieve symptoms, aeroallergen immunotherapy will be considered.  GERD Will controlled on current regimen.  For now, continue appropriate reflux  lifestyle modifications and omeprazole 20 mg daily.  If her reflux remains stable/well controlled, we will consider switching from omeprazole to ranitidine on her next visit.   Meds ordered this encounter  Medications  . azelastine (ASTELIN) 0.1 % nasal spray    Sig: Place 1 spray into both nostrils 2 (two) times daily.    Dispense:  30 mL    Refill:  5  . Beclomethasone Dipropionate (QNASL) 80 MCG/ACT AERS    Sig: Place 1 spray into the nose 2 (two) times daily.    Dispense:  8.7 g    Refill:  5    Refill approved. Last refill sent was to a different pharmacy.  . budesonide-formoterol (SYMBICORT) 160-4.5 MCG/ACT inhaler    Sig: Inhale 2 puffs into the lungs 2 (two) times daily.    Dispense:  1 Inhaler    Refill:  5  . omeprazole (PRILOSEC) 20 MG capsule    Sig: Take 1 capsule (20 mg total) by mouth daily.    Dispense:  30 capsule    Refill:  5    Patient needs Ov for further refills.    Diagnostics: Spirometry:  Normal with an FEV1 of 86% predicted.  Please see scanned spirometry results for details.    Physical examination: Blood pressure 124/76, pulse 72, resp. rate 12, height 5' 2.5" (1.588 m).  General: Alert, interactive, in no acute distress. HEENT: TMs pearly gray, turbinates mildly edematous without discharge, post-pharynx mildly erythematous. Neck: Supple without lymphadenopathy. Lungs: Clear to auscultation without wheezing, rhonchi or rales. CV: Normal S1, S2 without murmurs. Skin: Warm and dry, without lesions or rashes.  The following portions of the patient's history were reviewed and updated as appropriate: allergies, current medications, past family history, past medical history,  past social history, past surgical history and problem list.  Allergies as of 12/19/2017      Reactions   Morphine And Related Nausea And Vomiting   Codeine Anxiety   Dry mouth, uneasy feeling      Medication List        Accurate as of 12/19/17  9:52 PM. Always use your  most recent med list.          albuterol 108 (90 Base) MCG/ACT inhaler Commonly known as:  PROVENTIL HFA;VENTOLIN HFA Inhale 2 puffs into the lungs every 4 (four) hours as needed for wheezing or shortness of breath.   azelastine 0.1 % nasal spray Commonly known as:  ASTELIN Place 1 spray into both nostrils 2 (two) times daily.   Beclomethasone Dipropionate 80 MCG/ACT Aers Place 1 spray into the nose 2 (two) times daily.   Boric Acid Gran Apply topically.   budesonide-formoterol 160-4.5 MCG/ACT inhaler Commonly known as:  SYMBICORT Inhale 2 puffs into the lungs 2 (two) times daily.   escitalopram 10 MG tablet Commonly known as:  LEXAPRO TAKE 1 TABLET (10 MG TOTAL) BY MOUTH DAILY.   montelukast 10 MG tablet Commonly known as:  SINGULAIR Take 1 tablet (10 mg total) by mouth at bedtime.   omeprazole 20 MG capsule Commonly known as:  PRILOSEC Take 1 capsule (20 mg total) by mouth daily.   TRI-LO-MARZIA 0.18/0.215/0.25 MG-25 MCG tab Generic drug:  Norgestimate-Ethinyl Estradiol Triphasic TAKE 1 TABLET BY MOUTH DAILY.       Allergies  Allergen Reactions  . Morphine And Related Nausea And Vomiting  . Codeine Anxiety    Dry mouth, uneasy feeling    I appreciate the opportunity to take part in Jennifer Dean's care. Please do not hesitate to contact me with questions.  Sincerely,   R. Edgar Frisk, MD

## 2017-12-19 NOTE — Patient Instructions (Addendum)
Moderate persistent asthma Well controlled.  For now, continue Symbicort (budesonide/formoterol) 160/4.5 g, 2 inhalations via spacer device twice a day, montelukast 10 mg daily bedtime and albuterol HFA, 1 to 2 inhalations every 6 hours if needed and 15 minutes prior to exercise.  Subjective and objective measures of pulmonary function will be followed and the treatment plan will be adjusted accordingly.  Perennial and seasonal allergic rhinitis Stable.  Continue appropriate allergen avoidance measures, montelukast 10 mg daily, and azelastine nasal spray if needed.  Nasal saline spray (i.e., Simply Saline) or nasal saline lavage (i.e., NeilMed) is recommended as needed and prior to medicated nasal sprays.  If allergen avoidance measures and medications fail to adequately relieve symptoms, aeroallergen immunotherapy will be considered.  GERD Will controlled on current regimen.  For now, continue appropriate reflux lifestyle modifications and omeprazole 20 mg daily.  If her reflux remains stable/well controlled, we will consider switching from omeprazole to ranitidine on her next visit.   Return in about 6 months (around 06/19/2018), or if symptoms worsen or fail to improve.

## 2017-12-19 NOTE — Assessment & Plan Note (Signed)
Will controlled on current regimen.  For now, continue appropriate reflux lifestyle modifications and omeprazole 20 mg daily.  If her reflux remains stable/well controlled, we will consider switching from omeprazole to ranitidine on her next visit.

## 2017-12-20 ENCOUNTER — Encounter: Payer: Self-pay | Admitting: Family Medicine

## 2017-12-29 MED FILL — ESCITALOPRAM 10 MG TABLET: 10 | 30 days supply | Qty: 30 | Fill #5

## 2018-01-08 MED FILL — SYMBICORT 160-4.5 MCG INH: 160-4.5 | 30 days supply | Qty: 10 | Fill #1

## 2018-01-15 MED FILL — NORG-EE 0.18-0.215-0.25/0.0: 0.18/0.215/ | 84 days supply | Qty: 84 | Fill #3

## 2018-01-15 MED FILL — OMEPRAZOLE 20 MG CPDR: 20 | 30 days supply | Qty: 30 | Fill #0

## 2018-01-15 MED FILL — MONTELUKAST SOD 10 MG TAB: 10 | 30 days supply | Qty: 30 | Fill #5

## 2018-02-01 ENCOUNTER — Other Ambulatory Visit: Payer: Self-pay | Admitting: Family Medicine

## 2018-02-01 MED FILL — ESCITALOPRAM 10 MG TABLET: 10 | 30 days supply | Qty: 30 | Fill #0

## 2018-02-01 MED FILL — AZELASTINE HCL 137 MCG/SPRA: 137 | 30 days supply | Qty: 30 | Fill #2

## 2018-02-15 MED FILL — QNASL 80 MCG/ACT AERS: 80 | 30 days supply | Qty: 11 | Fill #0

## 2018-02-27 MED FILL — MONTELUKAST SOD 10 MG TAB: 10 | 30 days supply | Qty: 30 | Fill #6

## 2018-02-27 MED FILL — OMEPRAZOLE 20 MG CPDR: 20 | 30 days supply | Qty: 30 | Fill #1

## 2018-03-12 ENCOUNTER — Encounter: Payer: Self-pay | Admitting: Family Medicine

## 2018-03-13 ENCOUNTER — Encounter: Payer: Self-pay | Admitting: Family Medicine

## 2018-03-15 ENCOUNTER — Other Ambulatory Visit: Payer: Self-pay

## 2018-03-15 ENCOUNTER — Encounter: Payer: Self-pay | Admitting: Family Medicine

## 2018-03-15 ENCOUNTER — Other Ambulatory Visit (HOSPITAL_COMMUNITY)
Admission: RE | Admit: 2018-03-15 | Discharge: 2018-03-15 | Disposition: A | Discharge status: Home / Self Care | Payer: No Typology Code available for payment source | Source: Ambulatory Visit | Attending: Family Medicine | Admitting: Family Medicine

## 2018-03-15 ENCOUNTER — Ambulatory Visit (INDEPENDENT_AMBULATORY_CARE_PROVIDER_SITE_OTHER): Payer: No Typology Code available for payment source | Admitting: Family Medicine

## 2018-03-15 VITALS — BP 118/82 | HR 84 | Temp 98.8°F | Resp 14 | Ht 63.0 in | Wt 189.0 lb

## 2018-03-15 DIAGNOSIS — Z113 Encounter for screening for infections with a predominantly sexual mode of transmission: Secondary | ICD-10-CM | POA: Insufficient documentation

## 2018-03-15 DIAGNOSIS — E669 Obesity, unspecified: Secondary | ICD-10-CM

## 2018-03-15 DIAGNOSIS — K219 Gastro-esophageal reflux disease without esophagitis: Secondary | ICD-10-CM | POA: Diagnosis not present

## 2018-03-15 DIAGNOSIS — Z0000 Encounter for general adult medical examination without abnormal findings: Secondary | ICD-10-CM | POA: Diagnosis not present

## 2018-03-15 DIAGNOSIS — F329 Major depressive disorder, single episode, unspecified: Secondary | ICD-10-CM

## 2018-03-15 MED ORDER — ESCITALOPRAM OXALATE 10 MG PO TABS: 10.0000 mg | ORAL_TABLET | Freq: Every day | ORAL | 3 refills | Status: DC

## 2018-03-15 MED FILL — ESCITALOPRAM 10 MG TABLET: 10 | 90 days supply | Qty: 90 | Fill #0

## 2018-03-15 NOTE — Patient Instructions (Signed)
Please return in 6 months if needed for depression f/u if needed. Otherwise I will see you in 12 months for your annual physical.   If you have any questions or concerns, please don't hesitate to send me a message via MyChart or call the office at [phone]. Thank you for visiting with [place] today! It's our pleasure caring for you.  Please do these things to maintain good health!   Exercise at least 30-45 minutes a day,  4-5 days a week.   Eat a low-fat diet with lots of fruits and vegetables, up to 7-9 servings per day.  Drink plenty of water daily. Try to drink 8 8oz glasses per day.  Seatbelts can save your life. Always wear your seatbelt.  Place Smoke Detectors on every level of your home and check batteries every year.  Schedule an appointment with an eye doctor for an eye exam every 1-2 years  Safe sex - use condoms to protect yourself from STDs if you could be exposed to these types of infections. Use birth control if you do not want to become pregnant and are sexually active.  Avoid heavy alcohol use. If you drink, keep it to less than 2 drinks/day and not every day.  [place].  Choose someone you trust that could speak for you if you became unable to speak for yourself.  Depression is common in our stressful world.If you're feeling down or losing interest in things you normally enjoy, please come in for a visit.  If anyone is threatening or hurting you, please get help. Physical or Emotional Violence is never OK.
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
## 2018-03-16 LAB — CBC WITH DIFFERENTIAL/PLATELET
BASOS PCT: 0.9 % (ref 0.0–3.0)
Basophils Absolute: 0.1 10*3/uL (ref 0.0–0.1)
EOS ABS: 0.1 10*3/uL (ref 0.0–0.7)
EOS PCT: 1.6 % (ref 0.0–5.0)
HEMATOCRIT: 37.3 % (ref 36.0–46.0)
Hemoglobin: 12.3 g/dL (ref 12.0–15.0)
Lymphocytes Relative: 46.8 % — ABNORMAL HIGH (ref 12.0–46.0)
Lymphs Abs: 3 10*3/uL (ref 0.7–4.0)
MCHC: 32.9 g/dL (ref 30.0–36.0)
MCV: 90.7 fl (ref 78.0–100.0)
MONOS PCT: 6.8 % (ref 3.0–12.0)
Monocytes Absolute: 0.4 10*3/uL (ref 0.1–1.0)
NEUTROS ABS: 2.8 10*3/uL (ref 1.4–7.7)
Neutrophils Relative %: 43.9 % (ref 43.0–77.0)
PLATELETS: 423 10*3/uL — AB (ref 150.0–400.0)
RBC: 4.12 Mil/uL (ref 3.87–5.11)
RDW: 14.6 % (ref 11.5–15.5)
WBC: 6.5 10*3/uL (ref 4.0–10.5)

## 2018-03-16 LAB — COMPREHENSIVE METABOLIC PANEL
ALBUMIN: 3.7 g/dL (ref 3.5–5.2)
ALT: 14 U/L (ref 0–35)
AST: 26 U/L (ref 0–37)
Alkaline Phosphatase: 59 U/L (ref 39–117)
BUN: 8 mg/dL (ref 6–23)
CALCIUM: 8.3 mg/dL — AB (ref 8.4–10.5)
CHLORIDE: 105 meq/L (ref 96–112)
CO2: 20 mEq/L (ref 19–32)
CREATININE: 0.69 mg/dL (ref 0.40–1.20)
GFR: 123.14 mL/min (ref >60.00)
Glucose, Bld: 81 mg/dL (ref 70–99)
Potassium: 3.9 mEq/L (ref 3.5–5.1)
Sodium: 136 mEq/L (ref 135–145)
Total Bilirubin: 0.1 mg/dL — ABNORMAL LOW (ref 0.2–1.2)
Total Protein: 7 g/dL (ref 6.0–8.3)

## 2018-03-16 LAB — LIPID PANEL
CHOLESTEROL: 139 mg/dL (ref 0–200)
HDL: 51.6 mg/dL (ref >39.00)
LDL Cholesterol: 69 mg/dL (ref 0–99)
NonHDL: 87.56
TRIGLYCERIDES: 91 mg/dL (ref 0.0–149.0)
Total CHOL/HDL Ratio: 3
VLDL: 18.2 mg/dL (ref 0.0–40.0)

## 2018-03-16 LAB — HIV ANTIBODY (ROUTINE TESTING W REFLEX): HIV 1&2 Ab, 4th Generation: NONREACTIVE

## 2018-03-19 ENCOUNTER — Other Ambulatory Visit: Payer: Self-pay | Admitting: Family Medicine

## 2018-03-19 DIAGNOSIS — Z3041 Encounter for surveillance of contraceptive pills: Secondary | ICD-10-CM

## 2018-03-19 LAB — URINE CYTOLOGY ANCILLARY ONLY
Chlamydia: NEGATIVE
NEISSERIA GONORRHEA: NEGATIVE

## 2018-03-19 MED FILL — NORG-EE 0.18-0.215-0.25/0.0: 0.18/0.215/ | 84 days supply | Qty: 84 | Fill #0

## 2018-04-05 MED FILL — MONTELUKAST SOD 10 MG TAB: 10 | 30 days supply | Qty: 30 | Fill #7

## 2018-04-05 MED FILL — OMEPRAZOLE 20 MG CPDR: 20 | 30 days supply | Qty: 30 | Fill #2

## 2018-04-10 ENCOUNTER — Encounter: Payer: Self-pay | Admitting: Family Medicine

## 2018-04-11 MED FILL — SYMBICORT 160-4.5 MCG INH: 160-4.5 | 30 days supply | Qty: 10 | Fill #2

## 2018-04-12 ENCOUNTER — Ambulatory Visit: Payer: 59 | Admitting: Physician Assistant

## 2018-04-12 ENCOUNTER — Ambulatory Visit: Payer: No Typology Code available for payment source | Admitting: Family Medicine

## 2018-04-12 ENCOUNTER — Other Ambulatory Visit: Payer: Self-pay

## 2018-04-12 ENCOUNTER — Encounter: Payer: Self-pay | Admitting: Physician Assistant

## 2018-04-12 VITALS — BP 122/78 | HR 91 | Temp 98.3°F | Resp 14 | Ht 63.0 in | Wt 189.0 lb

## 2018-04-12 DIAGNOSIS — J029 Acute pharyngitis, unspecified: Secondary | ICD-10-CM

## 2018-04-12 LAB — POCT RAPID STREP A (OFFICE): Rapid Strep A Screen: NEGATIVE

## 2018-04-12 MED ORDER — FLUTICASONE PROPIONATE 50 MCG/ACT NA SUSP: 2.0000 | Freq: Every day | NASAL | 0 refills | Status: DC

## 2018-04-12 MED FILL — FLUTICASONE PROP 50 MCG SPR: 50 | 30 days supply | Qty: 16 | Fill #0

## 2018-04-12 NOTE — Patient Instructions (Signed)
Please increase fluids and get plenty of rest. Place a humidifier in the bedroom. Continue chronic allergy medications. Add on the Flonase nasal spray as well for now. I have sent in a prescription.  Follow-up if symptoms are not resolving.

## 2018-04-12 NOTE — Progress Notes (Signed)
Patient presents to clinic today c/o R ear pressure and mild scratchy throat. Was seen by [facility] a few weeks ago and diagnosed/treated for ear infection and sinusitis with Augmentin. Notes completing entire course of antibiotic with complete resolution of symptoms. Had been feeling fine until a couple of days ago. Denies fever, chills. No sore throat presently. Wants to make sure she does not have strep throat. .   Past Medical History:  Diagnosis Date  . Asthma   . Depression   . Eczema   . 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 History of chickenpox   . Obesity (BMI 30-39.9) 07/26/2012   Overview:  Nl sleep study 2014   . Vision abnormalities     Current Outpatient Medications on File Prior to Visit  Medication Sig Dispense Refill  . albuterol (VENTOLIN HFA) 108 (90 Base) MCG/ACT inhaler Inhale 2 puffs into the lungs every 4 (four) hours as needed for wheezing or shortness of breath. 1 Inhaler 5  . azelastine (ASTELIN) 0.1 % nasal spray Place 1 spray into both nostrils 2 (two) times daily. 30 mL 5  . Beclomethasone Dipropionate (QNASL) 80 MCG/ACT AERS Place 1 spray into the nose 2 (two) times daily. 8.7 g 5  . Boric Acid GRAN Apply topically.    . budesonide-formoterol (SYMBICORT) 160-4.5 MCG/ACT inhaler Inhale 2 puffs into the lungs 2 (two) times daily. 1 Inhaler 5  . escitalopram (LEXAPRO) 10 MG tablet Take 1 tablet (10 mg total) by mouth daily. 90 tablet 3  . montelukast (SINGULAIR) 10 MG tablet Take 1 tablet (10 mg total) by mouth at bedtime. 30 tablet 11  . Norgestimate-Ethinyl Estradiol Triphasic 0.18/0.215/0.25 MG-25 MCG tab TAKE 1 TABLET BY MOUTH DAILY. 28 tablet 11  . omeprazole (PRILOSEC) 20 MG capsule Take 1 capsule (20 mg total) by mouth daily. 30 capsule 5   No current facility-administered medications on file prior to visit.     Allergies  Allergen Reactions  . Morphine And Related Nausea And Vomiting  . Codeine Anxiety    Dry  mouth, uneasy feeling    Family History  Problem Relation Age of Onset  . Hypertension Mother   . GER disease Mother   . Eczema Mother   . Hypertension Father   . Post-traumatic stress disorder Father   . Allergic rhinitis Neg Hx   . Angioedema Neg Hx   . Asthma Neg Hx   . Atopy Neg Hx   . Immunodeficiency Neg Hx   . Urticaria Neg Hx     Social History   Socioeconomic History  . Marital status: Legally Separated    Spouse name: Not on file  . Number of children: 2  . Years of education: Not on file  . Highest education level: Not on file  Occupational History  . Occupation: Herbalist: [employer]  . Financial resource strain: Not on file  . Food insecurity:    Worry: Not on file    Inability: Not on file  . Transportation needs:    Medical: Not on file    Non-medical: Not on file  Tobacco Use  . Smoking status: Never Smoker  . Smokeless tobacco: Never Used  Substance and Sexual Activity  . Alcohol use: No  . Drug use: No  . Sexual activity: Yes    Birth control/protection: Pill  Lifestyle  . Physical activity:    Days per week: Not on  file    Minutes per session: Not on file  . Stress: Not on file  Relationships  . Social connections:    Talks on phone: Not on file    Gets together: Not on file    Attends religious service: Not on file    Active member of club or organization: Not on file    Attends meetings of clubs or organizations: Not on file    Relationship status: Not on file  Other Topics Concern  . Not on file  Social History Narrative  . Not on file    Review of Systems - See HPI.  All other ROS are negative.  BP 122/78   Pulse 91   Temp 98.3 F (36.8 C) (Oral)   Resp 14   Ht 5\' 3"  (1.6 m)   Wt 189 lb (85.7 kg)   SpO2 98%   BMI 33.48 kg/m   Physical Exam Constitutional:      Appearance: She is well-developed.  HENT:     Head: Normocephalic and atraumatic.     Right Ear: A middle ear effusion  (serous) is present.     Left Ear: Tympanic membrane normal.     Nose: No congestion or rhinorrhea.     Mouth/Throat:     Mouth: Mucous membranes are moist.     Pharynx: Oropharynx is clear. Uvula midline. No pharyngeal swelling, oropharyngeal exudate, posterior oropharyngeal erythema or uvula swelling.  Cardiovascular:     Rate and Rhythm: Normal rate and regular rhythm.     Heart sounds: Normal heart sounds.  Pulmonary:     Effort: Pulmonary effort is normal.     Breath sounds: Normal breath sounds.  Neurological:     Mental Status: She is alert.     Recent Results (from the past 2160 hour(s))  Urine cytology ancillary only([place])     Status: None   Collection Time: 03/15/18 12:00 AM  Result Value Ref Range   Chlamydia Negative     Comment: Normal Reference Range - Negative   Neisseria gonorrhea Negative     Comment: Normal Reference Range - Negative  CBC with Differential/Platelet     Status: Abnormal   Collection Time: 03/15/18  3:25 PM  Result Value Ref Range   WBC 6.5 4.0 - 10.5 K/uL   RBC 4.12 3.87 - 5.11 Mil/uL   Hemoglobin 12.3 12.0 - 15.0 g/dL   HCT 37.3 36.0 - 46.0 %   MCV 90.7 78.0 - 100.0 fl   MCHC 32.9 30.0 - 36.0 g/dL   RDW 14.6 11.5 - 15.5 %   Platelets 423.0 (H) 150.0 - 400.0 K/uL   Neutrophils Relative % 43.9 43.0 - 77.0 %   Lymphocytes Relative 46.8 (H) 12.0 - 46.0 %   Monocytes Relative 6.8 3.0 - 12.0 %   Eosinophils Relative 1.6 0.0 - 5.0 %   Basophils Relative 0.9 0.0 - 3.0 %   Neutro Abs 2.8 1.4 - 7.7 K/uL   Lymphs Abs 3.0 0.7 - 4.0 K/uL   Monocytes Absolute 0.4 0.1 - 1.0 K/uL   Eosinophils Absolute 0.1 0.0 - 0.7 K/uL   Basophils Absolute 0.1 0.0 - 0.1 K/uL  Comprehensive metabolic panel     Status: Abnormal   Collection Time: 03/15/18  3:25 PM  Result Value Ref Range   Sodium 136 135 - 145 mEq/L   Potassium 3.9 3.5 - 5.1 mEq/L   Chloride 105 96 - 112 mEq/L   CO2 20 19 - 32 mEq/L  Glucose, Bld 81 70 - 99 mg/dL   BUN 8 6 - 23 mg/dL    Creatinine, Ser 0.69 0.40 - 1.20 mg/dL   Total Bilirubin 0.1 (L) 0.2 - 1.2 mg/dL   Alkaline Phosphatase 59 39 - 117 U/L   AST 26 0 - 37 U/L   ALT 14 0 - 35 U/L   Total Protein 7.0 6.0 - 8.3 g/dL   Albumin 3.7 3.5 - 5.2 g/dL   Calcium 8.3 (L) 8.4 - 10.5 mg/dL   GFR 123.14 >60.00 mL/min  Lipid panel     Status: None   Collection Time: 03/15/18  3:25 PM  Result Value Ref Range   Cholesterol 139 0 - 200 mg/dL    Comment: ATP III Classification       Desirable:  < 200 mg/dL               Borderline High:  200 - 239 mg/dL          High:  > = 240 mg/dL   Triglycerides 91.0 0.0 - 149.0 mg/dL    Comment: Normal:  <150 mg/dLBorderline High:  150 - 199 mg/dL   HDL 51.60 >39.00 mg/dL   VLDL 18.2 0.0 - 40.0 mg/dL   LDL Cholesterol 69 0 - 99 mg/dL   Total CHOL/HDL Ratio 3     Comment:                Men          Women1/2 Average Risk     3.4          3.3Average Risk          5.0          4.42X Average Risk          9.6          7.13X Average Risk          15.0          11.0                       NonHDL 87.56     Comment: NOTE:  Non-HDL goal should be 30 mg/dL higher than patient's LDL goal (i.e. LDL goal of < 70 mg/dL, would have non-HDL goal of < 100 mg/dL)  HIV Antibody (routine testing w rflx)     Status: None   Collection Time: 03/15/18  3:25 PM  Result Value Ref Range   HIV 1&2 Ab, 4th Generation NON-REACTIVE NON-REACTI    Comment: HIV-1 antigen and HIV-1/HIV-2 antibodies were not detected. There is no laboratory evidence of HIV infection. Marland Kitchen PLEASE NOTE: This information has been disclosed to you from records whose confidentiality may be protected by state law.  If your state requires such protection, then the state law prohibits you from making any further disclosure of the information without the specific written consent of the person to whom it pertains, or as otherwise permitted by law. A general authorization for the release of medical or other information is NOT sufficient for  this purpose. . For additional information please refer to http://education.questdiagn[employer].com/faq/FAQ106 (This link is being provided for informational/ educational purposes only.) . Marland Kitchen The performance of this assay has not been clinically validated in patients less than 74 years old. .     Assessment/Plan: 1. Sore throat Negative strep. Seems related to allergic inflammation and mild drainage. Also some referred pain from serous otitis likely. Start Flonase nasal spray. Resume astelin and  chronic allergy medications. Increase fluids. Salt-water gargles recommended. Place humidifier in bedroom. Follow-up if not resolving.  - POCT rapid strep A    [name], PA-C

## 2018-04-30 MED FILL — QNASL 80 MCG/ACT AERS: 80 | 30 days supply | Qty: 11 | Fill #1

## 2018-05-11 ENCOUNTER — Other Ambulatory Visit: Payer: Self-pay | Admitting: Allergy and Immunology

## 2018-05-11 DIAGNOSIS — J3089 Other allergic rhinitis: Secondary | ICD-10-CM

## 2018-05-11 DIAGNOSIS — J4540 Moderate persistent asthma, uncomplicated: Secondary | ICD-10-CM

## 2018-05-11 MED FILL — MONTELUKAST SOD 10 MG TAB: 10 | 30 days supply | Qty: 30 | Fill #0 | Status: TO

## 2018-05-11 MED FILL — OMEPRAZOLE 20 MG CPDR: 20 | 30 days supply | Qty: 30 | Fill #3 | Status: TO

## 2018-06-04 MED FILL — AZELASTINE HCL 137 MCG SPRY: 0.1 | 50 days supply | Qty: 30 | Fill #0

## 2018-06-18 MED FILL — SYMBICORT 160-4.5 MCG INH: 160-4.5 | 30 days supply | Qty: 10 | Fill #3

## 2018-06-25 ENCOUNTER — Other Ambulatory Visit: Payer: Self-pay

## 2018-06-25 ENCOUNTER — Encounter: Payer: Self-pay | Admitting: Allergy and Immunology

## 2018-06-25 ENCOUNTER — Ambulatory Visit: Payer: No Typology Code available for payment source | Admitting: Allergy and Immunology

## 2018-06-25 VITALS — BP 120/76 | HR 73 | Resp 16

## 2018-06-25 DIAGNOSIS — J4540 Moderate persistent asthma, uncomplicated: Secondary | ICD-10-CM | POA: Diagnosis not present

## 2018-06-25 DIAGNOSIS — R040 Epistaxis: Secondary | ICD-10-CM

## 2018-06-25 DIAGNOSIS — K219 Gastro-esophageal reflux disease without esophagitis: Secondary | ICD-10-CM | POA: Diagnosis not present

## 2018-06-25 DIAGNOSIS — J3089 Other allergic rhinitis: Secondary | ICD-10-CM

## 2018-06-25 MED ORDER — BUDESONIDE-FORMOTEROL FUMARATE 160-4.5 MCG/ACT IN AERO: 2.0000 | INHALATION_SPRAY | Freq: Two times a day (BID) | RESPIRATORY_TRACT | 5 refills | Status: DC

## 2018-06-25 MED ORDER — MONTELUKAST SODIUM 10 MG PO TABS: 10.0000 mg | ORAL_TABLET | Freq: Every day | ORAL | 1 refills | Status: DC

## 2018-06-25 MED FILL — OMEPRAZOLE 20 MG CPDR: 20 | 30 days supply | Qty: 30 | Fill #0

## 2018-06-25 MED FILL — MONTELUKAST SOD 10 MG TAB: 10 | 30 days supply | Qty: 30 | Fill #0

## 2018-06-25 NOTE — Assessment & Plan Note (Signed)
   For now, continue appropriate reflux lifestyle modifications and omeprazole 20 mg daily.  If needed, may add famotidine (Pepcid) 1-2 times daily. 

## 2018-06-25 NOTE — Assessment & Plan Note (Signed)
   For now, continue Symbicort (budesonide/formoterol) 160/4.5 g, 2 inhalations via spacer device twice a day, montelukast 10 mg daily bedtime and albuterol HFA, 1 to 2 inhalations every 6 hours if needed and 15 minutes prior to exercise.  Subjective and objective measures of pulmonary function will be followed and the treatment plan will be adjusted accordingly.

## 2018-06-25 NOTE — Assessment & Plan Note (Addendum)
   Continue appropriate allergen avoidance measures, montelukast 10 mg daily, azelastine nasal spray, and Qnasl if needed.  Nasal saline spray (i.e., Simply Saline) is recommended as needed and prior to medicated nasal sprays.  For thick post nasal drainage, add guaifenesin 1200 mg (Mucinex Maximum Strength)  twice daily as needed with adequate hydration as discussed. 

## 2018-06-25 NOTE — Assessment & Plan Note (Addendum)
   Proper technique for stanching epistaxis has been discussed and demonstrated.  Nasal saline spray and/or nasal saline gel is recommended to moisturize nasal mucosa.  The use of a cool-mist humidifier during the night is recommended.  During epistaxis, if needed, oxymetazoline (Afrin) nasal sparay may be applied to a cotton ball to help stanch the blood flow.  If this problem persists or progresses, we will discontinue Qnasl and otolaryngology evaluation may be warranted.

## 2018-06-25 NOTE — Patient Instructions (Addendum)
Moderate persistent asthma  For now, continue Symbicort (budesonide/formoterol) 160/4.5 g, 2 inhalations via spacer device twice a day, montelukast 10 mg daily bedtime and albuterol HFA, 1 to 2 inhalations every 6 hours if needed and 15 minutes prior to exercise.  Subjective and objective measures of pulmonary function will be followed and the treatment plan will be adjusted accordingly.  Perennial and seasonal allergic rhinitis  Continue appropriate allergen avoidance measures, montelukast 10 mg daily, azelastine nasal spray, and Qnasl if needed.  Nasal saline spray (i.e., Simply Saline) is recommended as needed and prior to medicated nasal sprays.  For thick post nasal drainage, add guaifenesin 1200 mg (Mucinex Maximum Strength)  twice daily as needed with adequate hydration as discussed.  GERD  For now, continue appropriate reflux lifestyle modifications and omeprazole 20 mg daily.  If needed, may add famotidine (Pepcid) 1-2 times daily.  Epistaxis  Proper technique for stanching epistaxis has been discussed and demonstrated.  Nasal saline spray and/or nasal saline gel is recommended to moisturize nasal mucosa.  The use of a cool-mist humidifier during the night is recommended.  During epistaxis, if needed, oxymetazoline (Afrin) nasal sparay may be applied to a cotton ball to help stanch the blood flow.  If this problem persists or progresses, we will discontinue Qnasl and otolaryngology evaluation may be warranted.    Return in about 4 months (around 10/25/2018), or if symptoms worsen or fail to improve.

## 2018-06-25 NOTE — Progress Notes (Signed)
Follow-up Note  RE: Jennifer Dean MRN: [id number] DOB: [birth year] Date of Office Visit: 06/25/2018  Primary care provider: Leamon Arnt, MD Referring provider: Leamon Arnt, MD  History of present illness: Jennifer Dean is a 38 y.o. female with persistent asthma, allergic rhinitis, and gastroesophageal reflux disease presenting today for follow-up.  She reports that she had been taking Symbicort 160-4.5 g, 2 inhalations via spacer device once daily.  However, over the past few weeks she has begun experiencing asthma symptoms 3 times per week on average and therefore increased the dose to Symbicort 160-4.5 micro grams, 2 elations via spacer device twice daily with improvement.  She is also taking montelukast 10 mg daily at bedtime.  Her nasal allergy symptoms are well controlled with azelastine nasal spray and Qnasl nasal spray.  She reports occasional mild epistaxis.  She believes that her reflux is well controlled with omeprazole 20 mg daily.  However, she does experience occasional throat clearing and is uncertain if this is due to postnasal drainage or acid reflux.  Assessment and plan: Moderate persistent asthma  For now, continue Symbicort (budesonide/formoterol) 160/4.5 g, 2 inhalations via spacer device twice a day, montelukast 10 mg daily bedtime and albuterol HFA, 1 to 2 inhalations every 6 hours if needed and 15 minutes prior to exercise.  Subjective and objective measures of pulmonary function will be followed and the treatment plan will be adjusted accordingly.  Perennial and seasonal allergic rhinitis  Continue appropriate allergen avoidance measures, montelukast 10 mg daily, azelastine nasal spray, and Qnasl if needed.  Nasal saline spray (i.e., Simply Saline) is recommended as needed and prior to medicated nasal sprays.  For thick post nasal drainage, add guaifenesin 1200 mg (Mucinex Maximum Strength)  twice daily as needed with adequate hydration as  discussed.  GERD  For now, continue appropriate reflux lifestyle modifications and omeprazole 20 mg daily.  If needed, may add famotidine (Pepcid) 1-2 times daily.  Epistaxis  Proper technique for stanching epistaxis has been discussed and demonstrated.  Nasal saline spray and/or nasal saline gel is recommended to moisturize nasal mucosa.  The use of a cool-mist humidifier during the night is recommended.  During epistaxis, if needed, oxymetazoline (Afrin) nasal sparay may be applied to a cotton ball to help stanch the blood flow.  If this problem persists or progresses, we will discontinue Qnasl and otolaryngology evaluation may be warranted.    Meds ordered this encounter  Medications   budesonide-formoterol (SYMBICORT) 160-4.5 MCG/ACT inhaler    Sig: Inhale 2 puffs into the lungs 2 (two) times daily.    Dispense:  1 Inhaler    Refill:  5   montelukast (SINGULAIR) 10 MG tablet    Sig: Take 1 tablet (10 mg total) by mouth at bedtime.    Dispense:  30 tablet    Refill:  1    Diagnostics: Spirometry:  Normal with an FEV1 of 90% predicted.  Please see scanned spirometry results for details.    Physical examination: Blood pressure 120/76, pulse 73, resp. rate 16, SpO2 98 %.  General: Alert, interactive, in no acute distress. HEENT: TMs pearly gray, turbinates mildly edematous without discharge, post-pharynx unremarkable. Neck: Supple without lymphadenopathy. Lungs: Clear to auscultation without wheezing, rhonchi or rales. CV: Normal S1, S2 without murmurs. Skin: Warm and dry, without lesions or rashes.  The following portions of the patient's history were reviewed and updated as appropriate: allergies, current medications, past family history, past medical history, past social history, past  surgical history and problem list.  Allergies as of 06/25/2018      Reactions   Morphine And Related Nausea And Vomiting   Codeine Anxiety   Dry mouth, uneasy feeling        Medication List       Accurate as of June 25, 2018  6:37 PM. Always use your most recent med list.        albuterol 108 (90 Base) MCG/ACT inhaler Commonly known as:  Ventolin HFA Inhale 2 puffs into the lungs every 4 (four) hours as needed for wheezing or shortness of breath.   azelastine 0.1 % nasal spray Commonly known as:  ASTELIN Place 1 spray into both nostrils 2 (two) times daily.   Beclomethasone Dipropionate 80 MCG/ACT Aers Commonly known as:  Qnasl Place 1 spray into the nose 2 (two) times daily.   Boric Acid Gran Apply topically.   budesonide-formoterol 160-4.5 MCG/ACT inhaler Commonly known as:  Symbicort Inhale 2 puffs into the lungs 2 (two) times daily.   escitalopram 10 MG tablet Commonly known as:  LEXAPRO Take 1 tablet (10 mg total) by mouth daily.   montelukast 10 MG tablet Commonly known as:  SINGULAIR Take 1 tablet (10 mg total) by mouth at bedtime.   Norgestimate-Ethinyl Estradiol Triphasic 0.18/0.215/0.25 MG-25 MCG tab TAKE 1 TABLET BY MOUTH DAILY.   omeprazole 20 MG capsule Commonly known as:  PRILOSEC Take 1 capsule (20 mg total) by mouth daily.       Allergies  Allergen Reactions   Morphine And Related Nausea And Vomiting   Codeine Anxiety    Dry mouth, uneasy feeling   Review of systems: Review of systems negative except as noted in HPI / PMHx or noted below: Constitutional: Negative.  HENT: Negative.   Eyes: Negative.  Respiratory: Negative.   Cardiovascular: Negative.  Gastrointestinal: Negative.  Genitourinary: Negative.  Musculoskeletal: Negative.  Neurological: Negative.  Endo/Heme/Allergies: Negative.  Cutaneous: Negative.  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   GYN manages; has had PT   History of chickenpox    Obesity (BMI 30-39.9) 07/26/2012   Overview:  Nl sleep study 2014    Vision abnormalities     Family  History  Problem Relation Age of Onset   Hypertension Mother    GER disease Mother    Eczema Mother    Hypertension Father    Post-traumatic stress disorder Father    Allergic rhinitis Neg Hx    Angioedema Neg Hx    Asthma Neg Hx    Atopy Neg Hx    Immunodeficiency Neg Hx    Urticaria Neg Hx     Social History   Socioeconomic History   Marital status: Legally Separated    Spouse name: Not on file   Number of children: 2   Years of education: Not on file   Highest education level: Not on file  Occupational History   Occupation: Herbalist: [employer]   Financial resource strain: Not on file   Food insecurity:    Worry: Not on file    Inability: Not on file   Transportation needs:    Medical: Not on file    Non-medical: Not on file  Tobacco Use   Smoking status: Never Smoker   Smokeless tobacco: Never Used  Substance and Sexual Activity   Alcohol use: No  Drug use: No   Sexual activity: Yes    Birth control/protection: Pill  Lifestyle   Physical activity:    Days per week: Not on file    Minutes per session: Not on file   Stress: Not on file  Relationships   Social connections:    Talks on phone: Not on file    Gets together: Not on file    Attends religious service: Not on file    Active member of club or organization: Not on file    Attends meetings of clubs or organizations: Not on file    Relationship status: Not on file   Intimate partner violence:    Fear of current or ex partner: Not on file    Emotionally abused: Not on file    Physically abused: Not on file    Forced sexual activity: Not on file  Other Topics Concern   Not on file  Social History Narrative   Not on file    I appreciate the opportunity to take part in Brogan's care. Please do not hesitate to contact me with questions.  Sincerely,   R. Edgar Frisk, MD

## 2018-07-12 MED FILL — QNASL 80 MCG/ACT AERS: 80 | 30 days supply | Qty: 11 | Fill #0

## 2018-07-12 MED FILL — NORG-EE 0.18-0.215-0.25/0.0: 0.18/0.215/ | 84 days supply | Qty: 84 | Fill #0

## 2018-07-12 MED FILL — ESCITALOPRAM 10 MG TABLET: 10 | 90 days supply | Qty: 90 | Fill #0

## 2018-08-02 MED FILL — MONTELUKAST SOD 10 MG TAB: 10 | 30 days supply | Qty: 30 | Fill #0

## 2018-08-07 ENCOUNTER — Encounter: Payer: Self-pay | Admitting: Family Medicine

## 2018-08-08 MED FILL — OMEPRAZOLE 20 MG CPDR: 20 | 30 days supply | Qty: 30 | Fill #1

## 2018-08-24 ENCOUNTER — Other Ambulatory Visit: Payer: Self-pay

## 2018-08-24 ENCOUNTER — Encounter: Payer: Self-pay | Admitting: Family Medicine

## 2018-08-24 ENCOUNTER — Other Ambulatory Visit (HOSPITAL_COMMUNITY)
Admission: RE | Admit: 2018-08-24 | Discharge: 2018-08-24 | Disposition: A | Discharge status: Home / Self Care | Payer: 59 | Source: Ambulatory Visit | Attending: Family Medicine | Admitting: Family Medicine

## 2018-08-24 ENCOUNTER — Ambulatory Visit: Payer: 59 | Admitting: Family Medicine

## 2018-08-24 VITALS — BP 126/86 | HR 74 | Temp 98.2°F | Resp 16 | Ht 63.0 in | Wt 191.0 lb

## 2018-08-24 DIAGNOSIS — N898 Other specified noninflammatory disorders of vagina: Secondary | ICD-10-CM | POA: Diagnosis not present

## 2018-08-24 DIAGNOSIS — R8290 Unspecified abnormal findings in urine: Secondary | ICD-10-CM

## 2018-08-24 LAB — URINALYSIS, ROUTINE W REFLEX MICROSCOPIC
Bilirubin Urine: NEGATIVE
Ketones, ur: NEGATIVE
Nitrite: POSITIVE — AB
Specific Gravity, Urine: 1.025 (ref 1.000–1.030)
Total Protein, Urine: NEGATIVE
Urine Glucose: NEGATIVE
Urobilinogen, UA: 0.2 (ref 0.0–1.0)
pH: 6 (ref 5.0–8.0)

## 2018-08-24 LAB — POCT URINALYSIS DIPSTICK
Bilirubin, UA: POSITIVE
Blood, UA: POSITIVE
Glucose, UA: NEGATIVE
Ketones, UA: NEGATIVE
Nitrite, UA: POSITIVE
Protein, UA: NEGATIVE
Spec Grav, UA: >=1.03 — AB (ref 1.010–1.025)
Urobilinogen, UA: 0.2 E.U./dL
pH, UA: 6 (ref 5.0–8.0)
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
## 2018-08-24 NOTE — Patient Instructions (Signed)
Please start drinking more water; I will call you if your urine test looks infected.

## 2018-08-27 LAB — URINE CULTURE
MICRO NUMBER:: 500546
SPECIMEN QUALITY:: ADEQUATE

## 2018-08-27 MED ORDER — NITROFURANTOIN MONOHYD MACRO 100 MG PO CAPS: 100.0000 mg | ORAL_CAPSULE | Freq: Two times a day (BID) | ORAL | 0 refills | Status: DC

## 2018-08-27 NOTE — Addendum Note (Signed)
Addended by: Billey Chang on: 08/27/2018 09:32 AM   Modules accepted: Orders

## 2018-08-28 LAB — CERVICOVAGINAL ANCILLARY ONLY
Chlamydia: NEGATIVE
Neisseria Gonorrhea: NEGATIVE

## 2018-08-28 MED FILL — NITROFURANTOIN MONO-MCR 100: 100 | 5 days supply | Qty: 10 | Fill #0

## 2018-09-10 ENCOUNTER — Ambulatory Visit (INDEPENDENT_AMBULATORY_CARE_PROVIDER_SITE_OTHER): Payer: 59 | Admitting: Family Medicine

## 2018-09-10 ENCOUNTER — Encounter: Payer: Self-pay | Admitting: Family Medicine

## 2018-09-10 ENCOUNTER — Other Ambulatory Visit: Payer: Self-pay

## 2018-09-10 DIAGNOSIS — F329 Major depressive disorder, single episode, unspecified: Secondary | ICD-10-CM

## 2018-09-10 DIAGNOSIS — Z3041 Encounter for surveillance of contraceptive pills: Secondary | ICD-10-CM | POA: Diagnosis not present

## 2018-09-10 DIAGNOSIS — F5102 Adjustment insomnia: Secondary | ICD-10-CM | POA: Diagnosis not present

## 2018-09-10 DIAGNOSIS — J4540 Moderate persistent asthma, uncomplicated: Secondary | ICD-10-CM

## 2018-09-10 NOTE — Patient Instructions (Signed)
Please return in 6 months 12 months for your annual complete physical; please come fasting.   If you have any questions or concerns, please don't hesitate to send me a message via MyChart or call the office at [phone]. Thank you for visiting with [place] today! It's our pleasure caring for you.
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
## 2018-09-14 MED FILL — SYMBICORT 160-4.5 MCG INH: 160-4.5 | 30 days supply | Qty: 10 | Fill #0

## 2018-09-14 MED FILL — MONTELUKAST SOD 10 MG TAB: 10 | 30 days supply | Qty: 30 | Fill #0

## 2018-09-25 ENCOUNTER — Other Ambulatory Visit: Payer: Self-pay | Admitting: Allergy and Immunology

## 2018-09-25 MED FILL — OMEPRAZOLE 20 MG CPDR: 20 | 30 days supply | Qty: 30 | Fill #0

## 2018-10-05 MED FILL — TRI-LO-SPRINTEC TABLET: 0.18/0.215/ | 84 days supply | Qty: 84 | Fill #0

## 2018-10-05 MED FILL — QNASL 80 MCG/ACT AERS: 80 | 30 days supply | Qty: 11 | Fill #0

## 2018-10-05 MED FILL — AZELASTINE HCL 137 MCG SPRY: 0.1 | 50 days supply | Qty: 30 | Fill #1

## 2018-10-22 ENCOUNTER — Other Ambulatory Visit: Payer: Self-pay

## 2018-10-22 ENCOUNTER — Encounter: Payer: Self-pay | Admitting: Allergy and Immunology

## 2018-10-22 ENCOUNTER — Ambulatory Visit: Payer: No Typology Code available for payment source | Admitting: Allergy and Immunology

## 2018-10-22 DIAGNOSIS — K219 Gastro-esophageal reflux disease without esophagitis: Secondary | ICD-10-CM | POA: Diagnosis not present

## 2018-10-22 DIAGNOSIS — B349 Viral infection, unspecified: Secondary | ICD-10-CM | POA: Diagnosis not present

## 2018-10-22 DIAGNOSIS — J3089 Other allergic rhinitis: Secondary | ICD-10-CM | POA: Diagnosis not present

## 2018-10-22 DIAGNOSIS — J4540 Moderate persistent asthma, uncomplicated: Secondary | ICD-10-CM

## 2018-10-22 DIAGNOSIS — L230 Allergic contact dermatitis due to metals: Secondary | ICD-10-CM | POA: Diagnosis not present

## 2018-10-22 MED ORDER — MONTELUKAST SODIUM 10 MG PO TABS: 10.0000 mg | ORAL_TABLET | Freq: Every day | ORAL | 1 refills | Status: DC

## 2018-10-22 MED ORDER — BUDESONIDE-FORMOTEROL FUMARATE 160-4.5 MCG/ACT IN AERO: 2.0000 | INHALATION_SPRAY | Freq: Two times a day (BID) | RESPIRATORY_TRACT | 5 refills | Status: DC

## 2018-10-22 MED ORDER — AZELASTINE HCL 0.1 % NA SOLN: 1.0000 | Freq: Two times a day (BID) | NASAL | 5 refills | Status: DC

## 2018-10-22 MED FILL — SYMBICORT 160-4.5 MCG INH: 160-4.5 | 30 days supply | Qty: 10 | Fill #0

## 2018-10-22 MED FILL — MONTELUKAST SOD 10 MG TAB: 10 | 30 days supply | Qty: 30 | Fill #0

## 2018-10-22 NOTE — Assessment & Plan Note (Signed)
   Avoidance of nickel-containing metal is recommended.  If unable to avoid, use barrier cream (Gloves in a Bottle, or Liquid Gloves).

## 2018-10-22 NOTE — Patient Instructions (Addendum)
Moderate persistent asthma  Symbicort (budesonide/formoterol) 160/4.5 g, 2 inhalations via spacer device twice a day, montelukast 10 mg daily bedtime and albuterol HFA, 1 to 2 inhalations every 6 hours if needed and 15 minutes prior to exercise.  The montelukast boxed warning has been discussed.  Subjective and objective measures of pulmonary function will be followed and the treatment plan will be adjusted accordingly.  Perennial and seasonal allergic rhinitis  Continue appropriate allergen avoidance measures, montelukast 10 mg daily, azelastine nasal spray, and Qnasl if needed.  Nasal saline spray (i.e., Simply Saline) is recommended as needed and prior to medicated nasal sprays.  For thick post nasal drainage, add guaifenesin 1200 mg (Mucinex Maximum Strength)  twice daily as needed with adequate hydration as discussed.  GERD  For now, continue appropriate reflux lifestyle modifications and omeprazole 20 mg daily.  If needed, may add famotidine (Pepcid) 1-2 times daily.  Possible viral syndrome  Given the recent respiratory symptoms and GI symptoms, an order has been provided for COVID-19 swab test.  When lab results have returned the patient will be called with further recommendations.  Allergic contact dermatitis due to metals  Avoidance of nickel-containing metal is recommended.  If unable to avoid, use barrier cream (Gloves in a Bottle, or Liquid Gloves).   Return in about 4 months (around 02/22/2019), or if symptoms worsen or fail to improve.

## 2018-10-22 NOTE — Assessment & Plan Note (Addendum)
   Symbicort (budesonide/formoterol) 160/4.5 g, 2 inhalations via spacer device twice a day, montelukast 10 mg daily bedtime and albuterol HFA, 1 to 2 inhalations every 6 hours if needed and 15 minutes prior to exercise.  The montelukast boxed warning has been discussed.  Subjective and objective measures of pulmonary function will be followed and the treatment plan will be adjusted accordingly.

## 2018-10-22 NOTE — Progress Notes (Signed)
Follow-up Note  RE: Jennifer Dean MRN: [id number] DOB: 1981-01-02 Date of Office Visit: 10/22/2018  Primary care provider: Leamon Arnt, MD Referring provider: Leamon Arnt, MD  History of present illness: Jennifer Dean is a 38 y.o. female with persistent asthma, allergic rhinitis, and gastroesophageal reflux presenting today for follow-up.  She was last seen in this clinic on June 25, 2018.  She reports that this past week she had experienced chest tightness as well as diarrhea and "dull stomach pain."  She denies fevers and chills.  She does not believe that she has had contact with any COVID-19 positive individuals.  Other than the lower respiratory symptoms which occurred last week, her asthma has been well controlled with Symbicort 160-4.5 micro grams.  However, she admits that she oftentimes forgets to take the Symbicort and averages 2 inhalations once daily.  She reports that her nasal allergy symptoms are well controlled as long as she is taking azelastine nasal spray and Qnasl nasal spray.  Her acid reflux is controlled with omeprazole.  However, if she does not take the omeprazole her reflux symptoms return.  Finally, she complains of a rash that develops where she has had contact with nickel.  This typically occurs with her watchband and necklace.  She has been using topical triamcinolone to relieve the rash and asks if there are any other options short of getting rid of the jewelry.  Assessment and plan: Moderate persistent asthma  Symbicort (budesonide/formoterol) 160/4.5 g, 2 inhalations via spacer device twice a day, montelukast 10 mg daily bedtime and albuterol HFA, 1 to 2 inhalations every 6 hours if needed and 15 minutes prior to exercise.  The montelukast boxed warning has been discussed.  Subjective and objective measures of pulmonary function will be followed and the treatment plan will be adjusted accordingly.  Perennial and seasonal allergic rhinitis   Continue appropriate allergen avoidance measures, montelukast 10 mg daily, azelastine nasal spray, and Qnasl if needed.  Nasal saline spray (i.e., Simply Saline) is recommended as needed and prior to medicated nasal sprays.  For thick post nasal drainage, add guaifenesin 1200 mg (Mucinex Maximum Strength)  twice daily as needed with adequate hydration as discussed.  GERD  For now, continue appropriate reflux lifestyle modifications and omeprazole 20 mg daily.  If needed, may add famotidine (Pepcid) 1-2 times daily.  Possible viral syndrome  Given the recent respiratory symptoms and GI symptoms, an order has been provided for COVID-19 swab test.  When lab results have returned the patient will be called with further recommendations.  Allergic contact dermatitis due to metals  Avoidance of nickel-containing metal is recommended.  If unable to avoid, use barrier cream (Gloves in a Bottle, or Liquid Gloves).   Meds ordered this encounter  Medications  . budesonide-formoterol (SYMBICORT) 160-4.5 MCG/ACT inhaler    Sig: Inhale 2 puffs into the lungs 2 (two) times daily.    Dispense:  1 Inhaler    Refill:  5  . montelukast (SINGULAIR) 10 MG tablet    Sig: Take 1 tablet (10 mg total) by mouth at bedtime.    Dispense:  30 tablet    Refill:  1  . azelastine (ASTELIN) 0.1 % nasal spray    Sig: Place 1 spray into both nostrils 2 (two) times daily.    Dispense:  30 mL    Refill:  5    Diagnostics: Spirometry:  Normal with an FEV1 of 96% predicted and an FEV1 ratio of 90%.  This study was performed while the patient was asymptomatic.  Please see scanned spirometry results for details.   Physical examination: Blood pressure 120/70, pulse 100, temperature 98.3 F (36.8 C), temperature source Temporal, resp. rate 16, height 5\' 2"  (1.575 m), weight 191 lb 3.2 oz (86.7 kg), SpO2 97 %.  General: Alert, interactive, in no acute distress. HEENT: TMs pearly gray, turbinates mildly  edematous without discharge, post-pharynx mildly erythematous. Neck: Supple without lymphadenopathy. Lungs: Clear to auscultation without wheezing, rhonchi or rales. CV: Normal S1, S2 without murmurs. Skin: Warm and dry, without lesions or rashes.  The following portions of the patient's history were reviewed and updated as appropriate: allergies, current medications, past family history, past medical history, past social history, past surgical history and problem list.  Allergies as of 10/22/2018      Reactions   Morphine And Related Nausea And Vomiting   Codeine Anxiety   Dry mouth, uneasy feeling      Medication List       Accurate as of October 22, 2018  5:05 PM. If you have any questions, ask your nurse or doctor.        albuterol 108 (90 Base) MCG/ACT inhaler Commonly known as: Ventolin HFA Inhale 2 puffs into the lungs every 4 (four) hours as needed for wheezing or shortness of breath.   azelastine 0.1 % nasal spray Commonly known as: ASTELIN Place 1 spray into both nostrils 2 (two) times daily.   Beclomethasone Dipropionate 80 MCG/ACT Aers Commonly known as: Qnasl Place 1 spray into the nose 2 (two) times daily.   budesonide-formoterol 160-4.5 MCG/ACT inhaler Commonly known as: Symbicort Inhale 2 puffs into the lungs 2 (two) times daily.   escitalopram 10 MG tablet Commonly known as: LEXAPRO Take 1 tablet (10 mg total) by mouth daily.   montelukast 10 MG tablet Commonly known as: SINGULAIR Take 1 tablet (10 mg total) by mouth at bedtime.   Norgestimate-Ethinyl Estradiol Triphasic 0.18/0.215/0.25 MG-25 MCG tab TAKE 1 TABLET BY MOUTH DAILY.   omeprazole 20 MG capsule Commonly known as: PRILOSEC TAKE 1 CAPSULE (20 MG TOTAL) BY MOUTH DAILY.       Allergies  Allergen Reactions  . Morphine And Related Nausea And Vomiting  . Codeine Anxiety    Dry mouth, uneasy feeling   Review of systems: Review of systems negative except as noted in HPI / PMHx or noted  below: Constitutional: Negative.  HENT: Negative.   Eyes: Negative.  Respiratory: Negative.   Cardiovascular: Negative.  Gastrointestinal: Negative.  Genitourinary: Negative.  Musculoskeletal: Negative.  Neurological: Negative.  Endo/Heme/Allergies: Negative.  Cutaneous: Negative.  Past Medical History:  Diagnosis Date  . Asthma   . Depression   . Eczema   . 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 History of chickenpox   . Obesity (BMI 30-39.9) 07/26/2012   Overview:  Nl sleep study 2014   . Vision abnormalities     Family History  Problem Relation Age of Onset  . Hypertension Mother   . GER disease Mother   . Eczema Mother   . Hypertension Father   . Post-traumatic stress disorder Father   . Allergic rhinitis Neg Hx   . Angioedema Neg Hx   . Asthma Neg Hx   . Atopy Neg Hx   . Immunodeficiency Neg Hx   . Urticaria Neg Hx     Social History   Socioeconomic History  . Marital status: Legally Separated  Spouse name: Not on file  . Number of children: 2  . Years of education: Not on file  . Highest education level: Not on file  Occupational History  . Occupation: Herbalist: [employer]  . Financial resource strain: Not on file  . Food insecurity    Worry: Not on file    Inability: Not on file  . Transportation needs    Medical: Not on file    Non-medical: Not on file  Tobacco Use  . Smoking status: Never Smoker  . Smokeless tobacco: Never Used  Substance and Sexual Activity  . Alcohol use: No  . Drug use: No  . Sexual activity: Yes    Birth control/protection: Pill  Lifestyle  . Physical activity    Days per week: Not on file    Minutes per session: Not on file  . Stress: Not on file  Relationships  . Social Herbalist on phone: Not on file    Gets together: Not on file    Attends religious service: Not on file    Active member of club or organization: Not on file     Attends meetings of clubs or organizations: Not on file    Relationship status: Not on file  . Intimate partner violence    Fear of current or ex partner: Not on file    Emotionally abused: Not on file    Physically abused: Not on file    Forced sexual activity: Not on file  Other Topics Concern  . Not on file  Social History Narrative  . Not on file    I appreciate the opportunity to take part in Jennifer Dean's care. Please do not hesitate to contact me with questions.  Sincerely,   R. Edgar Frisk, MD

## 2018-10-22 NOTE — Assessment & Plan Note (Signed)
   Continue appropriate allergen avoidance measures, montelukast 10 mg daily, azelastine nasal spray, and Qnasl if needed.  Nasal saline spray (i.e., Simply Saline) is recommended as needed and prior to medicated nasal sprays.  For thick post nasal drainage, add guaifenesin 1200 mg (Mucinex Maximum Strength)  twice daily as needed with adequate hydration as discussed.

## 2018-10-22 NOTE — Assessment & Plan Note (Signed)
   For now, continue appropriate reflux lifestyle modifications and omeprazole 20 mg daily.  If needed, may add famotidine (Pepcid) 1-2 times daily.

## 2018-10-22 NOTE — Assessment & Plan Note (Signed)
   Given the recent respiratory symptoms and GI symptoms, an order has been provided for COVID-19 swab test.  When lab results have returned the patient will be called with further recommendations.

## 2018-10-23 NOTE — Addendum Note (Signed)
Addended by: Neomia Dear on: 10/23/2018 09:18 AM   Modules accepted: Orders

## 2018-10-24 ENCOUNTER — Ambulatory Visit (INDEPENDENT_AMBULATORY_CARE_PROVIDER_SITE_OTHER): Payer: 59 | Admitting: Family Medicine

## 2018-10-24 ENCOUNTER — Encounter: Payer: Self-pay | Admitting: Family Medicine

## 2018-10-24 ENCOUNTER — Other Ambulatory Visit: Payer: Self-pay

## 2018-10-24 VITALS — BP 120/70

## 2018-10-24 DIAGNOSIS — R197 Diarrhea, unspecified: Secondary | ICD-10-CM | POA: Diagnosis not present

## 2018-10-24 DIAGNOSIS — Z20828 Contact with and (suspected) exposure to other viral communicable diseases: Secondary | ICD-10-CM | POA: Diagnosis not present

## 2018-10-24 DIAGNOSIS — R0789 Other chest pain: Secondary | ICD-10-CM
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
## 2018-11-07 MED FILL — OMEPRAZOLE 20 MG CAPSULE DR: 20 | 30 days supply | Qty: 30 | Fill #0

## 2018-11-07 MED FILL — MONTELUKAST SOD 10 MG TAB: 10 | 30 days supply | Qty: 30 | Fill #0

## 2018-11-28 MED FILL — ESCITALOPRAM 10 MG TABLET: 10 | 90 days supply | Qty: 90 | Fill #0

## 2018-12-12 DIAGNOSIS — H52223 Regular astigmatism, bilateral: Secondary | ICD-10-CM | POA: Diagnosis not present

## 2018-12-12 DIAGNOSIS — H5213 Myopia, bilateral: Secondary | ICD-10-CM | POA: Diagnosis not present

## 2018-12-19 MED FILL — TRI-LO-SPRINTEC TABLET: 0.18/0.215/ | 84 days supply | Qty: 84 | Fill #1

## 2018-12-19 MED FILL — OMEPRAZOLE 20 MG CAPSULE DR: 20 | 30 days supply | Qty: 30 | Fill #1

## 2018-12-19 MED FILL — MONTELUKAST SOD 10 MG TAB: 10 | 30 days supply | Qty: 30 | Fill #1

## 2018-12-26 MED FILL — SYMBICORT 160-4.5 MCG INH: 160-4.5 | 30 days supply | Qty: 10 | Fill #0

## 2019-01-21 ENCOUNTER — Other Ambulatory Visit: Payer: Self-pay | Admitting: Allergy and Immunology

## 2019-01-21 MED FILL — QNASL 80 MCG/ACT AERS: 80 | 30 days supply | Qty: 11 | Fill #0

## 2019-01-31 ENCOUNTER — Other Ambulatory Visit: Payer: Self-pay | Admitting: Allergy and Immunology

## 2019-01-31 MED FILL — MONTELUKAST SOD 10 MG TAB: 10 | 30 days supply | Qty: 30 | Fill #0

## 2019-02-14 ENCOUNTER — Other Ambulatory Visit: Payer: Self-pay | Admitting: Allergy and Immunology

## 2019-02-14 MED FILL — AZELASTINE HCL 137 MCG SPRY: 0.1 | 50 days supply | Qty: 30 | Fill #0

## 2019-02-14 MED FILL — OMEPRAZOLE 20 MG CAPSULE DR: 20 | 30 days supply | Qty: 30 | Fill #0

## 2019-02-25 ENCOUNTER — Other Ambulatory Visit: Payer: Self-pay

## 2019-02-25 ENCOUNTER — Encounter: Payer: Self-pay | Admitting: Allergy and Immunology

## 2019-02-25 ENCOUNTER — Ambulatory Visit: Payer: 59 | Admitting: Allergy and Immunology

## 2019-02-25 VITALS — BP 120/70 | HR 94 | Temp 98.0°F | Resp 18 | Ht 63.0 in | Wt 195.0 lb

## 2019-02-25 DIAGNOSIS — J4540 Moderate persistent asthma, uncomplicated: Secondary | ICD-10-CM

## 2019-02-25 DIAGNOSIS — J3089 Other allergic rhinitis: Secondary | ICD-10-CM | POA: Diagnosis not present

## 2019-02-25 DIAGNOSIS — R040 Epistaxis: Secondary | ICD-10-CM

## 2019-02-25 DIAGNOSIS — R0609 Other forms of dyspnea: Secondary | ICD-10-CM | POA: Insufficient documentation

## 2019-02-25 NOTE — Progress Notes (Signed)
Follow-up Note  RE: AMIRACLE RZONCA MRN: [id number] DOB: 11-Sep-1980 Date of Office Visit: 02/25/2019  Primary care provider: Leamon Arnt, MD Referring provider: Leamon Arnt, MD  History of present illness: Jennifer Dean is a 38 y.o. female with persistent asthma, allergic rhinitis, gastroesophageal reflux presented today for follow-up.  She was last seen in this clinic on October 22, 2018.  She reports that her asthma has been well controlled while taking Symbicort 160-4.5 g HFA, 2 inhalations via spacer device once daily.  In addition, she is taking montelukast 10 mg daily.  While on this regimen she does not experience limitations in normal daily activities or nocturnal awakenings due to lower respiratory symptoms.  She reports that she has been experiencing occasional difficulty "getting enough air." She occasionally a deep satisfying on which temporarily relieves the sensation of air hunger.  The dyspnea is not enough to warrant albuterol use if she does not experience coughing or wheezing in conjunction.  She reports that her nasal allergy symptoms have been well controlled with azelastine nasal spray and Qnasl, however notes that since January 2020 she has had occasional mild epistaxis.  She typically sees some blood on a Kleenex from the left nostril.  She has not had free-flowing epistaxis.  She has mild/slight epistaxis 2 times per week on average.  Assessment and plan: Moderate persistent asthma  For now, continue Symbicort (budesonide/formoterol) 160/4.5 g, 2 inhalations via spacer device daily, montelukast 10 mg daily bedtime and albuterol HFA, 1 to 2 inhalations every 6 hours if needed and 15 minutes prior to exercise.  During upper respiratory tract infections and asthma flares, increase Symbicort to 2 inhalations via spacer device twice daily until symptoms have returned to baseline.  Subjective and objective measures of pulmonary function will be followed and the  treatment plan will be adjusted accordingly.  Perennial and seasonal allergic rhinitis  Continue appropriate allergen avoidance measures, montelukast 10 mg daily, azelastine nasal spray, and Qnasl if needed.  Nasal saline spray (i.e., Simply Saline) is recommended as needed and prior to medicated nasal sprays.  For thick post nasal drainage, add guaifenesin 1200 mg (Mucinex Maximum Strength)  twice daily as needed with adequate hydration as discussed.  Epistaxis  Proper technique for stanching epistaxis has been discussed and demonstrated.  Nasal saline spray and/or nasal saline gel is recommended to moisturize nasal mucosa.  The use of a cool-mist humidifier during the night is recommended.  During epistaxis, if needed, oxymetazoline (Afrin) nasal sparay may be applied to a cotton ball to help stanch the blood flow.  If this problem persists or progresses, we will discontinue Qnasl and otolaryngology evaluation may be warranted.   Sighing dyspnea The patients sensation of air-hunger, not being able to get a full breath on inspiration, which may eventually be relieved by a yawn suggests sighing dyspnea. Other less likely etiologies include vocal cord dysfunction. Spirometry today was normal.  Diaphragmatic breathing, or belly breathing, has been discussed with the patient as this technique often times relieves sighing dyspnea.  If this problem persists or progresses, consider evaluation by speech therapist or a trial of anxiolytic.   Diagnostics: Spirometry:  Normal with an FEV1 of 92% predicted. This study was performed while the patient was asymptomatic.  Please see scanned spirometry results for details.   Physical examination: Blood pressure 120/70, pulse 94, temperature 98 F (36.7 C), temperature source Temporal, resp. rate 18, height 5\' 3"  (1.6 m), weight 195 lb (88.5 kg), SpO2  99 %.  General: Alert, interactive, in no acute distress. HEENT: TMs pearly gray, turbinates  mildly edematous without discharge, post-pharynx mildly erythematous. Neck: Supple without lymphadenopathy. Lungs: Clear to auscultation without wheezing, rhonchi or rales. CV: Normal S1, S2 without murmurs. Skin: Warm and dry, without lesions or rashes.  The following portions of the patient's history were reviewed and updated as appropriate: allergies, current medications, past family history, past medical history, past social history, past surgical history and problem list.  Current Outpatient Medications  Medication Sig Dispense Refill  . albuterol (VENTOLIN HFA) 108 (90 Base) MCG/ACT inhaler Inhale 2 puffs into the lungs every 4 (four) hours as needed for wheezing or shortness of breath. 1 Inhaler 5  . azelastine (ASTELIN) 0.1 % nasal spray PLACE 1 SPRAY INTO BOTH NOSTRILS 2 TIMES DAILY. 30 mL 5  . budesonide-formoterol (SYMBICORT) 160-4.5 MCG/ACT inhaler Inhale 2 puffs into the lungs 2 (two) times daily. 1 Inhaler 5  . escitalopram (LEXAPRO) 10 MG tablet Take 1 tablet (10 mg total) by mouth daily. 90 tablet 3  . montelukast (SINGULAIR) 10 MG tablet TAKE 1 TABLET (10 MG TOTAL) BY MOUTH AT BEDTIME. 30 tablet 1  . Norgestimate-Ethinyl Estradiol Triphasic 0.18/0.215/0.25 MG-25 MCG tab TAKE 1 TABLET BY MOUTH DAILY. 28 tablet 11  . omeprazole (PRILOSEC) 20 MG capsule TAKE 1 CAPSULE (20 MG TOTAL) BY MOUTH DAILY. 30 capsule 1  . QNASL 80 MCG/ACT AERS PLACE 1 SPRAY INTO THE NOSE 2 TIMES DAILY. 10.6 g 0   No current facility-administered medications for this visit.     Allergies  Allergen Reactions  . Morphine And Related Nausea And Vomiting  . Codeine Anxiety    Dry mouth, uneasy feeling    Review of systems: Review of systems negative except as noted in HPI / PMHx or noted below: Constitutional: Negative.  HENT: Negative.   Eyes: Negative.  Respiratory: Negative.   Cardiovascular: Negative.  Gastrointestinal: Negative.  Genitourinary: Negative.  Musculoskeletal: Negative.   Neurological: Negative.  Endo/Heme/Allergies: Negative.  Cutaneous: Negative.   Past Medical History:  Diagnosis Date  . Asthma   . Depression   . Eczema   . 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 History of chickenpox   . Obesity (BMI 30-39.9) 07/26/2012   Overview:  Nl sleep study 2014   . Vision abnormalities     Family History  Problem Relation Age of Onset  . Hypertension Mother   . GER disease Mother   . Eczema Mother   . Hypertension Father   . Post-traumatic stress disorder Father   . Allergic rhinitis Neg Hx   . Angioedema Neg Hx   . Asthma Neg Hx   . Atopy Neg Hx   . Immunodeficiency Neg Hx   . Urticaria Neg Hx     Social History   Socioeconomic History  . Marital status: Legally Separated    Spouse name: Not on file  . Number of children: 2  . Years of education: Not on file  . Highest education level: Not on file  Occupational History  . Occupation: Herbalist: [employer]  . Financial resource strain: Not on file  . Food insecurity    Worry: Not on file    Inability: Not on file  . Transportation needs    Medical: Not on file    Non-medical: Not on file  Tobacco Use  . Smoking status: Never Smoker  .  Smokeless tobacco: Never Used  Substance and Sexual Activity  . Alcohol use: No  . Drug use: No  . Sexual activity: Yes    Birth control/protection: Pill  Lifestyle  . Physical activity    Days per week: Not on file    Minutes per session: Not on file  . Stress: Not on file  Relationships  . Social Herbalist on phone: Not on file    Gets together: Not on file    Attends religious service: Not on file    Active member of club or organization: Not on file    Attends meetings of clubs or organizations: Not on file    Relationship status: Not on file  . Intimate partner violence    Fear of current or ex partner: Not on file    Emotionally abused: Not on file     Physically abused: Not on file    Forced sexual activity: Not on file  Other Topics Concern  . Not on file  Social History Narrative  . Not on file    I appreciate the opportunity to take part in Blanca's care. Please do not hesitate to contact me with questions.  Sincerely,   R. Edgar Frisk, MD

## 2019-02-25 NOTE — Assessment & Plan Note (Signed)
   Proper technique for stanching epistaxis has been discussed and demonstrated.  Nasal saline spray and/or nasal saline gel is recommended to moisturize nasal mucosa.  The use of a cool-mist humidifier during the night is recommended.  During epistaxis, if needed, oxymetazoline (Afrin) nasal sparay may be applied to a cotton ball to help stanch the blood flow.  If this problem persists or progresses, we will discontinue Qnasl and otolaryngology evaluation may be warranted.

## 2019-02-25 NOTE — Patient Instructions (Addendum)
Moderate persistent asthma  For now, continue Symbicort (budesonide/formoterol) 160/4.5 g, 2 inhalations via spacer device daily, montelukast 10 mg daily bedtime and albuterol HFA, 1 to 2 inhalations every 6 hours if needed and 15 minutes prior to exercise.  During upper respiratory tract infections and asthma flares, increase Symbicort to 2 inhalations via spacer device twice daily until symptoms have returned to baseline.  Subjective and objective measures of pulmonary function will be followed and the treatment plan will be adjusted accordingly.  Perennial and seasonal allergic rhinitis  Continue appropriate allergen avoidance measures, montelukast 10 mg daily, azelastine nasal spray, and Qnasl if needed.  Nasal saline spray (i.e., Simply Saline) is recommended as needed and prior to medicated nasal sprays.  For thick post nasal drainage, add guaifenesin 1200 mg (Mucinex Maximum Strength)  twice daily as needed with adequate hydration as discussed.  Epistaxis  Proper technique for stanching epistaxis has been discussed and demonstrated.  Nasal saline spray and/or nasal saline gel is recommended to moisturize nasal mucosa.  The use of a cool-mist humidifier during the night is recommended.  During epistaxis, if needed, oxymetazoline (Afrin) nasal sparay may be applied to a cotton ball to help stanch the blood flow.  If this problem persists or progresses, we will discontinue Qnasl and otolaryngology evaluation may be warranted.   Sighing dyspnea The patients sensation of air-hunger, not being able to get a full breath on inspiration, which may eventually be relieved by a yawn suggests sighing dyspnea. Other less likely etiologies include vocal cord dysfunction. Spirometry today was normal.  Diaphragmatic breathing, or belly breathing, has been discussed with the patient as this technique often times relieves sighing dyspnea.  If this problem persists or progresses, consider  evaluation by speech therapist or a trial of anxiolytic.   Return in about 5 months (around 07/26/2019), or if symptoms worsen or fail to improve.

## 2019-02-25 NOTE — Assessment & Plan Note (Signed)
   Continue appropriate allergen avoidance measures, montelukast 10 mg daily, azelastine nasal spray, and Qnasl if needed.  Nasal saline spray (i.e., Simply Saline) is recommended as needed and prior to medicated nasal sprays.  For thick post nasal drainage, add guaifenesin 1200 mg (Mucinex Maximum Strength)  twice daily as needed with adequate hydration as discussed.

## 2019-02-25 NOTE — Assessment & Plan Note (Signed)
The patients sensation of air-hunger, not being able to get a full breath on inspiration, which may eventually be relieved by a yawn suggests sighing dyspnea. Other less likely etiologies include vocal cord dysfunction. Spirometry today was normal.  Diaphragmatic breathing, or belly breathing, has been discussed with the patient as this technique often times relieves sighing dyspnea.  If this problem persists or progresses, consider evaluation by speech therapist or a trial of anxiolytic.

## 2019-02-25 NOTE — Assessment & Plan Note (Signed)
   For now, continue Symbicort (budesonide/formoterol) 160/4.5 g, 2 inhalations via spacer device daily, montelukast 10 mg daily bedtime and albuterol HFA, 1 to 2 inhalations every 6 hours if needed and 15 minutes prior to exercise.  During upper respiratory tract infections and asthma flares, increase Symbicort to 2 inhalations via spacer device twice daily until symptoms have returned to baseline.  Subjective and objective measures of pulmonary function will be followed and the treatment plan will be adjusted accordingly.

## 2019-02-27 DIAGNOSIS — H04123 Dry eye syndrome of bilateral lacrimal glands: Secondary | ICD-10-CM | POA: Diagnosis not present

## 2019-03-11 MED FILL — CYCLOBENZAPRINE 5 MG TABLET: 5 | 15 days supply | Qty: 15 | Fill #0

## 2019-03-18 ENCOUNTER — Encounter: Payer: Self-pay | Admitting: Family Medicine

## 2019-03-18 NOTE — Telephone Encounter (Signed)
Please call pt and have her reschedule physical with pap smear when she is done with her cycle. Also see her other question about e check in.

## 2019-03-19 ENCOUNTER — Other Ambulatory Visit: Payer: Self-pay | Admitting: Family Medicine

## 2019-03-19 DIAGNOSIS — Z3041 Encounter for surveillance of contraceptive pills: Secondary | ICD-10-CM

## 2019-03-19 MED FILL — TRI-LO-SPRINTEC TABLET: 0.18/0.215/ | 84 days supply | Qty: 84 | Fill #0

## 2019-03-19 MED FILL — MONTELUKAST SOD 10 MG TAB: 10 | 30 days supply | Qty: 30 | Fill #1

## 2019-03-19 NOTE — Telephone Encounter (Signed)
See Note

## 2019-03-20 ENCOUNTER — Other Ambulatory Visit: Payer: Self-pay

## 2019-03-20 ENCOUNTER — Ambulatory Visit (INDEPENDENT_AMBULATORY_CARE_PROVIDER_SITE_OTHER): Payer: 59 | Admitting: Family Medicine

## 2019-03-20 ENCOUNTER — Other Ambulatory Visit (HOSPITAL_COMMUNITY)
Admission: RE | Admit: 2019-03-20 | Discharge: 2019-03-20 | Disposition: A | Discharge status: Home / Self Care | Payer: 59 | Source: Ambulatory Visit | Attending: Family Medicine | Admitting: Family Medicine

## 2019-03-20 ENCOUNTER — Encounter: Payer: Self-pay | Admitting: Family Medicine

## 2019-03-20 VITALS — BP 122/86 | HR 86 | Temp 98.7°F | Ht 63.0 in | Wt 201.8 lb

## 2019-03-20 DIAGNOSIS — Z124 Encounter for screening for malignant neoplasm of cervix: Secondary | ICD-10-CM | POA: Insufficient documentation

## 2019-03-20 DIAGNOSIS — F329 Major depressive disorder, single episode, unspecified: Secondary | ICD-10-CM

## 2019-03-20 DIAGNOSIS — Z3041 Encounter for surveillance of contraceptive pills: Secondary | ICD-10-CM

## 2019-03-20 DIAGNOSIS — Z0000 Encounter for general adult medical examination without abnormal findings: Secondary | ICD-10-CM | POA: Diagnosis not present

## 2019-03-20 LAB — CBC WITH DIFFERENTIAL/PLATELET
Basophils Absolute: 0 10*3/uL (ref 0.0–0.1)
Basophils Relative: 1 % (ref 0.0–3.0)
Eosinophils Absolute: 0.1 10*3/uL (ref 0.0–0.7)
Eosinophils Relative: 3.1 % (ref 0.0–5.0)
HCT: 37.4 % (ref 36.0–46.0)
Hemoglobin: 12.2 g/dL (ref 12.0–15.0)
Lymphocytes Relative: 43.5 % (ref 12.0–46.0)
Lymphs Abs: 1.8 10*3/uL (ref 0.7–4.0)
MCHC: 32.6 g/dL (ref 30.0–36.0)
MCV: 93.3 fl (ref 78.0–100.0)
Monocytes Absolute: 0.3 10*3/uL (ref 0.1–1.0)
Monocytes Relative: 6.8 % (ref 3.0–12.0)
Neutro Abs: 1.9 10*3/uL (ref 1.4–7.7)
Neutrophils Relative %: 45.6 % (ref 43.0–77.0)
Platelets: 353 10*3/uL (ref 150.0–400.0)
RBC: 4.01 Mil/uL (ref 3.87–5.11)
RDW: 13.9 % (ref 11.5–15.5)
WBC: 4.2 10*3/uL (ref 4.0–10.5)

## 2019-03-20 LAB — COMPREHENSIVE METABOLIC PANEL
ALT: 6 U/L (ref 0–35)
AST: 14 U/L (ref 0–37)
Albumin: 3.7 g/dL (ref 3.5–5.2)
Alkaline Phosphatase: 57 U/L (ref 39–117)
BUN: 10 mg/dL (ref 6–23)
CO2: 25 mEq/L (ref 19–32)
Calcium: 8.4 mg/dL (ref 8.4–10.5)
Chloride: 104 mEq/L (ref 96–112)
Creatinine, Ser: 0.76 mg/dL (ref 0.40–1.20)
GFR: 103.06 mL/min (ref >60.00)
Glucose, Bld: 78 mg/dL (ref 70–99)
Potassium: 4.2 mEq/L (ref 3.5–5.1)
Sodium: 138 mEq/L (ref 135–145)
Total Bilirubin: 0.3 mg/dL (ref 0.2–1.2)
Total Protein: 6.7 g/dL (ref 6.0–8.3)

## 2019-03-20 LAB — LIPID PANEL
Cholesterol: 155 mg/dL (ref 0–200)
HDL: 58.8 mg/dL (ref >39.00)
LDL Cholesterol: 83 mg/dL (ref 0–99)
NonHDL: 96.48
Total CHOL/HDL Ratio: 3
Triglycerides: 67 mg/dL (ref 0.0–149.0)
VLDL: 13.4 mg/dL (ref 0.0–40.0)

## 2019-03-20 MED ORDER — OMEPRAZOLE 20 MG PO CPDR: 20.0000 mg | DELAYED_RELEASE_CAPSULE | Freq: Every day | ORAL | 3 refills | Status: DC

## 2019-03-20 MED FILL — OMEPRAZOLE 20 MG CAPSULE DR: 20 | 90 days supply | Qty: 90 | Fill #0
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
## 2019-03-20 NOTE — Patient Instructions (Addendum)
Please return in 6 months for mood follow up.   I will release your lab results to you on your MyChart account with further instructions. Please reply with any questions.  Happy Holidays!!  If you have any questions or concerns, please don't hesitate to send me a message via MyChart or call the office at [phone]. Thank you for visiting with [place] today! It's our pleasure caring for you.   Preventive Care 77-38 Years Old, Female Preventive care refers to visits with your health care provider and lifestyle choices that can promote health and wellness. This includes:  A yearly physical exam. This may also be called an annual well check.  Regular dental visits and eye exams.  Immunizations.  Screening for certain conditions.  Healthy lifestyle choices, such as eating a healthy diet, getting regular exercise, not using drugs or products that contain nicotine and tobacco, and limiting alcohol use. What can I expect for my preventive care visit? Physical exam Your health care provider will check your:  Height and weight. This may be used to calculate body mass index (BMI), which tells if you are at a healthy weight.  Heart rate and blood pressure.  Skin for abnormal spots. Counseling Your health care provider may ask you questions about your:  Alcohol, tobacco, and drug use.  Emotional well-being.  Home and relationship well-being.  Sexual activity.  Eating habits.  Work and work Statistician.  Method of birth control.  Menstrual cycle.  Pregnancy history. What immunizations do I need?  Influenza (flu) vaccine  This is recommended every year. Tetanus, diphtheria, and pertussis (Tdap) vaccine  You may need a Td booster every 10 years. Varicella (chickenpox) vaccine  You may need this if you have not been vaccinated. Human papillomavirus (HPV) vaccine  If recommended by your health care provider, you may need three doses over 6 months. Measles, mumps, and  rubella (MMR) vaccine  You may need at least one dose of MMR. You may also need a second dose. Meningococcal conjugate (MenACWY) vaccine  One dose is recommended if you are age 70-21 years and a first-year college student living in a residence hall, or if you have one of several medical conditions. You may also need additional booster doses. Pneumococcal conjugate (PCV13) vaccine  You may need this if you have certain conditions and were not previously vaccinated. Pneumococcal polysaccharide (PPSV23) vaccine  You may need one or two doses if you smoke cigarettes or if you have certain conditions. Hepatitis A vaccine  You may need this if you have certain conditions or if you travel or work in places where you may be exposed to hepatitis A. Hepatitis B vaccine  You may need this if you have certain conditions or if you travel or work in places where you may be exposed to hepatitis B. Haemophilus influenzae type b (Hib) vaccine  You may need this if you have certain conditions. You may receive vaccines as individual doses or as more than one vaccine together in one shot (combination vaccines). Talk with your health care provider about the risks and benefits of combination vaccines. What tests do I need?  Blood tests  Lipid and cholesterol levels. These may be checked every 5 years starting at age 41.  Hepatitis C test.  Hepatitis B test. Screening  Diabetes screening. This is done by checking your blood sugar (glucose) after you have not eaten for a while (fasting).  Sexually transmitted disease (STD) testing.  BRCA-related cancer screening. This may be done  if you have a family history of breast, ovarian, tubal, or peritoneal cancers.  Pelvic exam and Pap test. This may be done every 3 years starting at age 4. Starting at age 68, this may be done every 5 years if you have a Pap test in combination with an HPV test. Talk with your health care provider about your test results,  treatment options, and if necessary, the need for more tests. Follow these instructions at home: Eating and drinking   Eat a diet that includes fresh fruits and vegetables, whole grains, lean protein, and low-fat dairy.  Take vitamin and mineral supplements as recommended by your health care provider.  Do not drink alcohol if: ? Your health care provider tells you not to drink. ? You are pregnant, may be pregnant, or are planning to become pregnant.  If you drink alcohol: ? Limit how much you have to 0-1 drink a day. ? Be aware of how much alcohol is in your drink. In the [place] one drink equals one 12 oz bottle of beer (355 mL), one 5 oz glass of wine (148 mL), or one 1 oz glass of hard liquor (44 mL). Lifestyle  Take daily care of your teeth and gums.  Stay active. Exercise for at least 30 minutes on 5 or more days each week.  Do not use any products that contain nicotine or tobacco, such as cigarettes, e-cigarettes, and chewing tobacco. If you need help quitting, ask your health care provider.  If you are sexually active, practice safe sex. Use a condom or other form of birth control (contraception) in order to prevent pregnancy and STIs (sexually transmitted infections). If you plan to become pregnant, see your health care provider for a preconception visit. What's next?  Visit your health care provider once a year for a well check visit.  Ask your health care provider how often you should have your eyes and teeth checked.  Stay up to date on all vaccines. This information is not intended to replace advice given to you by your health care provider. Make sure you discuss any questions you have with your health care provider. Document Released: 05/17/2001 Document Revised: 11/30/2017 Document Reviewed: 11/30/2017 Elsevier Patient Education  2020 [employer].

## 2019-03-26 LAB — CYTOLOGY - PAP
Comment: NEGATIVE
Comment: NEGATIVE
Diagnosis: NEGATIVE
HPV 16: NEGATIVE
HPV 18 / 45: NEGATIVE
High risk HPV: POSITIVE — AB

## 2019-04-09 ENCOUNTER — Encounter: Payer: Self-pay | Admitting: Family Medicine

## 2019-04-11 ENCOUNTER — Other Ambulatory Visit: Payer: Self-pay | Admitting: Family Medicine

## 2019-04-11 MED FILL — ESCITALOPRAM 10 MG TABLET: 10 | 90 days supply | Qty: 90 | Fill #0

## 2019-04-11 MED FILL — SYMBICORT 160-4.5 MCG INH: 160-4.5 | 30 days supply | Qty: 10 | Fill #1

## 2019-05-14 ENCOUNTER — Other Ambulatory Visit: Payer: Self-pay | Admitting: Allergy and Immunology

## 2019-05-14 MED FILL — MONTELUKAST SOD 10 MG TAB: 10 | 30 days supply | Qty: 30 | Fill #0

## 2019-05-14 NOTE — Telephone Encounter (Signed)
error 

## 2019-05-21 ENCOUNTER — Other Ambulatory Visit: Payer: Self-pay | Admitting: Allergy and Immunology

## 2019-05-21 MED FILL — QNASL 80 MCG/ACT AERS: 80 | 30 days supply | Qty: 11 | Fill #0

## 2019-06-11 MED FILL — TRI-LO-SPRINTEC TABLET: 0.18/0.215/ | 28 days supply | Qty: 28 | Fill #1

## 2019-06-13 ENCOUNTER — Other Ambulatory Visit: Payer: Self-pay | Admitting: Family Medicine

## 2019-06-13 DIAGNOSIS — Z3041 Encounter for surveillance of contraceptive pills: Secondary | ICD-10-CM

## 2019-07-03 ENCOUNTER — Other Ambulatory Visit: Payer: Self-pay

## 2019-07-03 ENCOUNTER — Ambulatory Visit (INDEPENDENT_AMBULATORY_CARE_PROVIDER_SITE_OTHER): Payer: 59 | Admitting: Physician Assistant

## 2019-07-03 ENCOUNTER — Encounter: Payer: Self-pay | Admitting: Physician Assistant

## 2019-07-03 VITALS — BP 118/80 | HR 83 | Temp 97.8°F | Ht 63.0 in | Wt 211.2 lb

## 2019-07-03 DIAGNOSIS — R21 Rash and other nonspecific skin eruption: Secondary | ICD-10-CM | POA: Diagnosis not present

## 2019-07-03 MED ORDER — NYSTATIN 100000 UNIT/GM EX CREA: TOPICAL_CREAM | CUTANEOUS | 0 refills | Status: DC

## 2019-07-03 MED FILL — NYSTATIN 100,000 UNIT/GM CR: 100000 | 7 days supply | Qty: 30 | Fill #0

## 2019-07-03 NOTE — Progress Notes (Signed)
Jennifer Dean is a 39 y.o. female here for a new problem.  I acted as a Education administrator for [employer], PA-C [employer], LPN  History of Present Illness:   Chief Complaint  Patient presents with  . groin irritation    HPI   Groin irritation Pt c/o bilateral groin irritation x 3 weeks. Pt has tried cortisone cream on groin area and feels like this actually made it worse. The area is itchy. Last time she has groomed herself was about 1-2 months ago. Denies new soaps/lotions/detergents. Does have a habit of sitting in sweaty workout clothes after she works out. Denies: vaginal discharge, LAD, fevers, chills, blisters/lesions  Past Medical History:  Diagnosis Date  . Asthma   . Depression   . Eczema   . 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 History of chickenpox   . Obesity (BMI 30-39.9) 07/26/2012   Overview:  Nl sleep study 2014   . Vision abnormalities      Social History   Socioeconomic History  . Marital status: Divorced    Spouse name: Not on file  . Number of children: 2  . Years of education: Not on file  . Highest education level: Not on file  Occupational History  . Occupation: Herbalist: [employer]  Tobacco Use  . Smoking status: Never Smoker  . Smokeless tobacco: Never Used  Substance and Sexual Activity  . Alcohol use: No  . Drug use: No  . Sexual activity: Yes    Birth control/protection: Pill  Other Topics Concern  . Not on file  Social History Narrative  . Not on file   Social Determinants of Health   Financial Resource Strain:   . Difficulty of Paying Living Expenses:   Food Insecurity:   . Worried About Charity fundraiser in the Last Year:   . Arboriculturist in the Last Year:   Transportation Needs:   . Film/video editor (Medical):   Marland Kitchen Lack of Transportation (Non-Medical):   Physical Activity:   . Days of Exercise per Week:   . Minutes of Exercise per Session:   Stress:    . Feeling of Stress :   Social Connections:   . Frequency of Communication with Friends and Family:   . Frequency of Social Gatherings with Friends and Family:   . Attends Religious Services:   . Active Member of Clubs or Organizations:   . Attends Archivist Meetings:   Marland Kitchen Marital Status:   Intimate Partner Violence:   . Fear of Current or Ex-Partner:   . Emotionally Abused:   Marland Kitchen Physically Abused:   . Sexually Abused:     Past Surgical History:  Procedure Laterality Date  . WISDOM TOOTH EXTRACTION      Family History  Problem Relation Age of Onset  . Hypertension Mother   . GER disease Mother   . Eczema Mother   . Hypertension Father   . Post-traumatic stress disorder Father   . Allergic rhinitis Neg Hx   . Angioedema Neg Hx   . Asthma Neg Hx   . Atopy Neg Hx   . Immunodeficiency Neg Hx   . Urticaria Neg Hx     Allergies  Allergen Reactions  . Morphine And Related Nausea And Vomiting  . Codeine Anxiety    Dry mouth, uneasy feeling    Current Medications:   Current Outpatient Medications:  .  albuterol (VENTOLIN HFA) 108 (90 Base) MCG/ACT inhaler, Inhale 2 puffs into the lungs every 4 (four) hours as needed for wheezing or shortness of breath., Disp: 1 Inhaler, Rfl: 5 .  azelastine (ASTELIN) 0.1 % nasal spray, PLACE 1 SPRAY INTO BOTH NOSTRILS 2 TIMES DAILY., Disp: 30 mL, Rfl: 5 .  budesonide-formoterol (SYMBICORT) 160-4.5 MCG/ACT inhaler, Inhale 2 puffs into the lungs 2 (two) times daily., Disp: 1 Inhaler, Rfl: 5 .  escitalopram (LEXAPRO) 10 MG tablet, TAKE 1 TABLET (10 MG TOTAL) BY MOUTH DAILY., Disp: 90 tablet, Rfl: 1 .  montelukast (SINGULAIR) 10 MG tablet, TAKE 1 TABLET (10 MG TOTAL) BY MOUTH AT BEDTIME., Disp: 30 tablet, Rfl: 1 .  omeprazole (PRILOSEC) 20 MG capsule, Take 1 capsule (20 mg total) by mouth daily., Disp: 90 capsule, Rfl: 3 .  QNASL 80 MCG/ACT AERS, PLACE 1 SPRAY INTO THE NOSE 2 TIMES DAILY., Disp: 10.6 g, Rfl: 1 .  TRI-LO-SPRINTEC  0.18/0.215/0.25 MG-25 MCG tab, TAKE 1 TABLET BY MOUTH DAILY., Disp: 28 tablet, Rfl: 3 .  nystatin cream (MYCOSTATIN), Apply to affected area 1-2 times daily, Disp: 30 g, Rfl: 0   Review of Systems:   ROS  Negative unless otherwise specified per HPI.  Vitals:   Vitals:   07/03/19 1406  BP: 118/80  Pulse: 83  Temp: 97.8 F (36.6 C)  TempSrc: Temporal  SpO2: 98%  Weight: 211 lb 4 oz (95.8 kg)  Height: 5\' 3"  (1.6 m)     Body mass index is 37.42 kg/m.  Physical Exam:   Physical Exam Constitutional:      Appearance: She is well-developed.  HENT:     Head: Normocephalic and atraumatic.  Eyes:     Conjunctiva/sclera: Conjunctivae normal.  Pulmonary:     Effort: Pulmonary effort is normal.  Musculoskeletal:        General: Normal range of motion.     Cervical back: Normal range of motion and neck supple.  Skin:    General: Skin is warm and dry.     Comments: Erythematous macerated skin to bilateral inguinal folds; no lesions/tenderness  Neurological:     Mental Status: She is alert and oriented to person, place, and time.  Psychiatric:        Behavior: Behavior normal.        Thought Content: Thought content normal.        Judgment: Judgment normal.       Assessment and Plan:   Jennifer Dean was seen today for groin irritation.  Diagnoses and all orders for this visit:  Rash  Other orders -     nystatin cream (MYCOSTATIN); Apply to affected area 1-2 times daily   Suspect fungal component. Discussed adequate hygiene and emphasized keeping area dry especially after wearing gym clothes. Nystatin cream 1-2 times daily, close follow-up if no improvement or any worsening.  . Reviewed expectations re: course of current medical issues. . Discussed self-management of symptoms. . Outlined signs and symptoms indicating need for more acute intervention. . Patient verbalized understanding and all questions were answered. . See orders for this visit as documented in the  electronic medical record. . Patient received an After-Visit Summary.  CMA or LPN served as scribe during this visit. History, Physical, and Plan performed by medical provider. The above documentation has been reviewed and is accurate and complete.   Inda Coke, PA-C

## 2019-07-03 NOTE — Patient Instructions (Signed)
It was great to see you!  Use the ointment 1-2 times daily. ALWAYS change out of wet work-out clothes immediately after use.  Vaginal Hygiene Healthy practices for vaginal hygiene - Avoid pajamas. A robe / nightgown allows better air circulation. Sleep without panties / panties whenever possible. - Wear cotton panties / panties during the day. After washing the panties / panties, rinse them twice to avoid irritating residues. Do not use fabric softeners for panties and / or swimsuits. - Avoid tights, leotard, leggings, tight pants or other tight attire. Loose skirts and pants allow air to circulate. - Avoid the pantiprotector. Better use tampons or sanitary pads. It is beneficial to bathe with warm water every day: - Soak in clean water (without soap) for 10 to 15 minutes. It has not been specifically studied that adding vinegar or baking soda / baking soda to water is of benefit and may not be better than water alone. - Use soap to wash the other regions apart from the genital area before leaving the tub. Limit the use of soap in the genital areas. Use soaps without fragrance. - Rinse the genital area and pat dry. A hair dryer can only be used if cold air is used. - Do not use bubble bath or fragrant soap. - Do not use vaginal sprays or talcum powder. These contain chemicals that irritate the skin. - If the genital area is sore or swollen, fragrance-free disposable wipes can be used instead of toilet paper. - Emollients, such as Vaseline may help protect the skin and may be applied to the irritated area. - Always remember to clean yourself from front to back after bowel movements. Pat dry after urinating. - Don't keep the wet swimsuit on for a long time after swimming.

## 2019-07-11 MED FILL — MONTELUKAST SOD 10 MG TAB: 10 | 30 days supply | Qty: 30 | Fill #1

## 2019-07-11 MED FILL — TRI-LO-SPRINTEC TABLET: 0.18/0.215/ | 84 days supply | Qty: 84 | Fill #0

## 2019-07-29 ENCOUNTER — Ambulatory Visit: Payer: No Typology Code available for payment source | Admitting: Allergy and Immunology

## 2019-09-05 ENCOUNTER — Other Ambulatory Visit: Payer: Self-pay | Admitting: Allergy and Immunology

## 2019-09-05 MED FILL — AZELASTINE HCL 137 MCG SPRY: 0.1 | 50 days supply | Qty: 30 | Fill #1

## 2019-09-05 MED FILL — MONTELUKAST SOD 10 MG TAB: 10 | 30 days supply | Qty: 30 | Fill #0

## 2019-09-06 ENCOUNTER — Telehealth: Payer: Self-pay | Admitting: Allergy and Immunology

## 2019-09-12 ENCOUNTER — Ambulatory Visit: Payer: 59 | Admitting: Allergy and Immunology

## 2019-09-19 ENCOUNTER — Other Ambulatory Visit: Payer: Self-pay | Admitting: Family Medicine

## 2019-09-19 ENCOUNTER — Encounter: Payer: Self-pay | Admitting: Family Medicine

## 2019-09-19 ENCOUNTER — Ambulatory Visit (INDEPENDENT_AMBULATORY_CARE_PROVIDER_SITE_OTHER): Payer: 59 | Admitting: Family Medicine

## 2019-09-19 ENCOUNTER — Other Ambulatory Visit: Payer: Self-pay

## 2019-09-19 VITALS — BP 120/82 | HR 90 | Temp 97.6°F | Ht 63.0 in | Wt 214.0 lb

## 2019-09-19 DIAGNOSIS — F329 Major depressive disorder, single episode, unspecified: Secondary | ICD-10-CM

## 2019-09-19 MED ORDER — ESCITALOPRAM OXALATE 20 MG PO TABS: 20.0000 mg | ORAL_TABLET | Freq: Every day | ORAL | 1 refills | Status: DC

## 2019-09-19 MED FILL — ESCITALOPRAM 20 MG TABLET: 20 | 90 days supply | Qty: 90 | Fill #0
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
## 2019-09-19 NOTE — Patient Instructions (Addendum)
Please return in 8 weeks for recheck mood.  Consider getting your covid vaccine!  Please seek counseling: [facility] would be a choice. OR Please call [place] [facility] to schedule an appointment with Dr. Trey Paula; she is a therapist here at our [place] office.  The phone number is: [phone]  Oprah's soul conversations podcast is great.  [name] is great.   If you have any questions or concerns, please don't hesitate to send me a message via MyChart or call the office at [phone]. Thank you for visiting with [place] today! It's our pleasure caring for you.

## 2019-09-25 ENCOUNTER — Other Ambulatory Visit: Payer: Self-pay

## 2019-10-03 ENCOUNTER — Telehealth: Payer: Self-pay | Admitting: *Deleted

## 2019-10-03 NOTE — Telephone Encounter (Signed)
That is fine.  Advair 250-50 g, 1 inhalation twice daily. Thanks.

## 2019-10-03 NOTE — Telephone Encounter (Signed)
Received fax from pharmacy- pt is requesting to change from Symbicort due to it being too expensive and would like Advair Diskus sent in, please advise?

## 2019-10-04 MED FILL — TRI-LO-SPRINTEC TABLET: 0.18/0.215/ | 28 days supply | Qty: 28 | Fill #1

## 2019-10-04 MED FILL — SYMBICORT 160-4.5 MCG INH: 160-4.5 | 30 days supply | Qty: 10 | Fill #2

## 2019-10-04 MED FILL — QNASL 80 MCG/ACT AERS: 80 | 30 days supply | Qty: 11 | Fill #1

## 2019-10-07 ENCOUNTER — Encounter: Payer: Self-pay | Admitting: Family Medicine

## 2019-10-08 ENCOUNTER — Telehealth: Payer: Self-pay | Admitting: Family Medicine

## 2019-10-08 ENCOUNTER — Other Ambulatory Visit: Payer: Self-pay

## 2019-10-08 ENCOUNTER — Emergency Department (HOSPITAL_COMMUNITY)
Admission: EM | Admit: 2019-10-08 | Discharge: 2019-10-08 | Disposition: A | Discharge status: Home / Self Care | Payer: 59 | Attending: Emergency Medicine | Admitting: Emergency Medicine

## 2019-10-08 DIAGNOSIS — Z7951 Long term (current) use of inhaled steroids: Secondary | ICD-10-CM | POA: Diagnosis not present

## 2019-10-08 DIAGNOSIS — R519 Headache, unspecified: Secondary | ICD-10-CM | POA: Insufficient documentation

## 2019-10-08 DIAGNOSIS — Z79899 Other long term (current) drug therapy: Secondary | ICD-10-CM | POA: Diagnosis not present

## 2019-10-08 DIAGNOSIS — G4089 Other seizures: Secondary | ICD-10-CM | POA: Diagnosis not present

## 2019-10-08 DIAGNOSIS — J45909 Unspecified asthma, uncomplicated: Secondary | ICD-10-CM | POA: Diagnosis not present

## 2019-10-08 MED ORDER — PROCHLORPERAZINE EDISYLATE 10 MG/2ML IJ SOLN
10.0000 mg | Freq: Once | INTRAMUSCULAR | Status: AC
  Administered 2019-10-08: 10 mg via INTRAMUSCULAR
  Filled 2019-10-08: qty 2

## 2019-10-08 MED ORDER — DIPHENHYDRAMINE HCL 50 MG/ML IJ SOLN
25.0000 mg | Freq: Once | INTRAMUSCULAR | Status: AC
  Administered 2019-10-08: 25 mg via INTRAMUSCULAR
  Filled 2019-10-08: qty 1

## 2019-10-08 NOTE — ED Triage Notes (Signed)
Patient reports she has a hx of pseudoseizures and is having episodes and head pain. Patient reports she was told by her PCP to come in and get seen.

## 2019-10-08 NOTE — Telephone Encounter (Signed)
Nurse Assessment Nurse: Vallery Sa, RN, Cathy Date/Time (Eastern Time): 10/08/2019 9:41:27 AM Confirm and document reason for call. If symptomatic, describe symptoms. ---Jezebel states that she had about 5 Pseudo seizures yesterday. She feels tired today. No severe breathing difficulty or blueness around her lips. No injury or impact to head. No fever. Alert and responsive. Has the patient had close contact with a person known or suspected to have the novel coronavirus illness OR traveled / lives in area with major community spread (including international travel) in the last 14 days from the onset of symptoms? * If Asymptomatic, screen for exposure and travel within the last 14 days. ---No Does the patient have any new or worsening symptoms? ---Yes Will a triage be completed? ---Yes Related visit to physician within the last 2 weeks? ---No Does the PT have any chronic conditions? (i.e. diabetes, asthma, this includes High risk factors for pregnancy, etc.) ---Yes List chronic conditions. ---Asthma, GERD, Depression Is the patient pregnant or possibly pregnant? (Ask all females between the ages of 68-55) ---No Is this a behavioral health or substance abuse call? ---NoPLEASE NOTE: All timestamps contained within this report are represented as [place] Standard Time. CONFIDENTIALTY NOTICE: This fax transmission is intended only for the addressee. It contains information that is legally privileged, confidential or otherwise protected from use or disclosure. If you are not the intended recipient, you are strictly prohibited from reviewing, disclosing, copying using or disseminating any of this information or taking any action in reliance on or regarding this information. If you have received this fax in error, please notify us immediately by telephone so that we can arrange for its return to [place]. Phone: [phone], Toll-Free: [phone], Fax: [phone] Page: 2 of 2 Call Id:  [id number] Guidelines Guideline Title Affirmed Question Affirmed Notes Nurse Date/Time Eilene Ghazi Time) Seizure [1] Wants to sleep after the seizure AND [2] persists much longer than usual Vaughn, RN, Tye [place] 10/08/2019 9:43:47 AM Disp. Time Eilene Ghazi Time) Disposition Final User 10/08/2019 9:48:35 AM Go to ED Now (or PCP triage) Yes Vallery Sa, RN, Rosey [place] Disagree/Comply Comply Caller Understands Yes PreDisposition Did not know what to do Care Advice Given Per Guideline GO TO ED NOW (OR PCP TRIAGE): * IF NO PCP (PRIMARY CARE PROVIDER) SECOND-LEVEL TRIAGE: You need to be seen within the next hour. Go to the [facility] at _____________ [place] as soon as you can. NOTE TO TRIAGER - DRIVING: * Another adult should drive. * If the seizure victim remains significantly confused or if there are any other problems with automobile transport, then the caller should be instructed to call EMS-911. CALL EMS 911: * Call EMS 911 if another seizure occurs. CARE ADVICE given per Seizure (Adult) guideline. Comments User: Berton Mount, RN Date/Time (Eastern Time): 10/08/2019 9:50:06 AM Natina is continuing to have Pseudoseizures today and feels very weak, tired. She will go to the ER now. Referrals GO TO FACILITY OTHER - [facility]

## 2019-10-08 NOTE — Telephone Encounter (Signed)
Lm for pt to call us back to confirm pharmacy
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
## 2019-10-08 NOTE — Discharge Instructions (Signed)
Follow up with the neurologist in the office.  Please return for worsening headache fever or inability to eat or drink.

## 2019-10-09 ENCOUNTER — Other Ambulatory Visit: Payer: Self-pay | Admitting: Allergy and Immunology

## 2019-10-09 MED ORDER — FLUTICASONE-SALMETEROL 250-50 MCG/DOSE IN AEPB: 1.0000 | INHALATION_SPRAY | Freq: Two times a day (BID) | RESPIRATORY_TRACT | 5 refills | Status: DC

## 2019-10-09 NOTE — Telephone Encounter (Signed)
Left a message for pt, I sent Advair Diskus into her pharmacy and it is covered.

## 2019-10-10 NOTE — Telephone Encounter (Signed)
Pt informed for switch

## 2019-10-11 ENCOUNTER — Ambulatory Visit: Payer: 59 | Admitting: Neurology

## 2019-10-11 ENCOUNTER — Other Ambulatory Visit: Payer: Self-pay

## 2019-10-11 ENCOUNTER — Encounter: Payer: Self-pay | Admitting: Neurology

## 2019-10-11 VITALS — BP 112/81 | HR 79 | Ht 62.0 in | Wt 213.0 lb

## 2019-10-11 DIAGNOSIS — R42 Dizziness and giddiness: Secondary | ICD-10-CM

## 2019-10-11 DIAGNOSIS — R519 Headache, unspecified: Secondary | ICD-10-CM

## 2019-10-11 DIAGNOSIS — Z62810 Personal history of physical and sexual abuse in childhood: Secondary | ICD-10-CM

## 2019-10-11 DIAGNOSIS — F445 Conversion disorder with seizures or convulsions: Secondary | ICD-10-CM | POA: Diagnosis not present

## 2019-10-11 DIAGNOSIS — G8929 Other chronic pain: Secondary | ICD-10-CM

## 2019-10-11 DIAGNOSIS — F329 Major depressive disorder, single episode, unspecified: Secondary | ICD-10-CM

## 2019-10-11 MED ORDER — LAMOTRIGINE 50 MG PO TBDP: ORAL_TABLET | ORAL | 5 refills | Status: DC

## 2019-10-11 MED FILL — lamoTRIgine 50 MG TBDP: 50 | 30 days supply | Qty: 60 | Fill #0
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
## 2019-10-25 MED FILL — MONTELUKAST SOD 10 MG TAB: 10 | 30 days supply | Qty: 30 | Fill #1

## 2019-10-30 ENCOUNTER — Other Ambulatory Visit: Payer: Self-pay | Admitting: Family Medicine

## 2019-10-30 DIAGNOSIS — Z3041 Encounter for surveillance of contraceptive pills: Secondary | ICD-10-CM

## 2019-10-30 MED FILL — TRI-VYLIBRA LO 0.18/0.215/0: 0.18/0.215/ | 84 days supply | Qty: 84 | Fill #0

## 2019-11-06 ENCOUNTER — Encounter: Payer: Self-pay | Admitting: Family Medicine

## 2019-11-14 ENCOUNTER — Encounter: Payer: Self-pay | Admitting: Family Medicine

## 2019-11-14 ENCOUNTER — Other Ambulatory Visit: Payer: Self-pay

## 2019-11-14 ENCOUNTER — Ambulatory Visit: Payer: 59 | Admitting: Family Medicine

## 2019-11-14 VITALS — BP 140/96 | HR 80 | Temp 98.3°F | Wt 218.8 lb

## 2019-11-14 DIAGNOSIS — F445 Conversion disorder with seizures or convulsions: Secondary | ICD-10-CM | POA: Diagnosis not present

## 2019-11-14 DIAGNOSIS — F329 Major depressive disorder, single episode, unspecified: Secondary | ICD-10-CM

## 2019-11-14 DIAGNOSIS — Z3009 Encounter for other general counseling and advice on contraception: Secondary | ICD-10-CM

## 2019-11-14 NOTE — Patient Instructions (Signed)
Please return in December 2021 for your annual complete physical; please come fasting.  If you have any questions or concerns, please don't hesitate to send me a message via MyChart or call the office at [phone]. Thank you for visiting with [place] today! It's our pleasure caring for you.  Please set up an appointment for counseling.   Etonogestrel implant What is this medicine? ETONOGESTREL (et oh noe JES trel) is a contraceptive (birth control) device. It is used to prevent pregnancy. It can be used for up to 3 years. This medicine may be used for other purposes; ask your health care provider or pharmacist if you have questions. COMMON BRAND NAME(S): Implanon, Nexplanon What should I tell my health care provider before I take this medicine? They need to know if you have any of these conditions:  abnormal vaginal bleeding  blood vessel disease or blood clots  breast, cervical, endometrial, ovarian, liver, or uterine cancer  diabetes  gallbladder disease  heart disease or recent heart attack  high blood pressure  high cholesterol or triglycerides  kidney disease  liver disease  migraine headaches  seizures  stroke  tobacco smoker  an unusual or allergic reaction to etonogestrel, anesthetics or antiseptics, other medicines, foods, dyes, or preservatives  pregnant or trying to get pregnant  breast-feeding How should I use this medicine? This device is inserted just under the skin on the inner side of your upper arm by a health care professional. Talk to your pediatrician regarding the use of this medicine in children. Special care may be needed. Overdosage: If you think you have taken too much of this medicine contact a poison control center or emergency room at once. NOTE: This medicine is only for you. Do not share this medicine with others. What if I miss a dose? This does not apply. What may interact with this medicine? Do not take this medicine with any of  the following medications:  amprenavir  fosamprenavir This medicine may also interact with the following medications:  acitretin  aprepitant  armodafinil  bexarotene  bosentan  carbamazepine  certain medicines for fungal infections like fluconazole, ketoconazole, itraconazole and voriconazole  certain medicines to treat hepatitis, HIV or AIDS  cyclosporine  felbamate  griseofulvin  lamotrigine  modafinil  oxcarbazepine  phenobarbital  phenytoin  primidone  rifabutin  rifampin  rifapentine  St. John's wort  topiramate This list may not describe all possible interactions. Give your health care provider a list of all the medicines, herbs, non-prescription drugs, or dietary supplements you use. Also tell them if you smoke, drink alcohol, or use illegal drugs. Some items may interact with your medicine. What should I watch for while using this medicine? This product does not protect you against HIV infection (AIDS) or other sexually transmitted diseases. You should be able to feel the implant by pressing your fingertips over the skin where it was inserted. Contact your doctor if you cannot feel the implant, and use a non-hormonal birth control method (such as condoms) until your doctor confirms that the implant is in place. Contact your doctor if you think that the implant may have broken or become bent while in your arm. You will receive a user card from your health care provider after the implant is inserted. The card is a record of the location of the implant in your upper arm and when it should be removed. Keep this card with your health records. What side effects may I notice from receiving this medicine? Side  effects that you should report to your doctor or health care professional as soon as possible:  allergic reactions like skin rash, itching or hives, swelling of the face, lips, or tongue  breast lumps, breast tissue changes, or discharge  breathing  problems  changes in emotions or moods  coughing up blood  if you feel that the implant may have broken or bent while in your arm  high blood pressure  pain, irritation, swelling, or bruising at the insertion site  scar at site of insertion  signs of infection at the insertion site such as fever, and skin redness, pain or discharge  signs and symptoms of a blood clot such as breathing problems; changes in vision; chest pain; severe, sudden headache; pain, swelling, warmth in the leg; trouble speaking; sudden numbness or weakness of the face, arm or leg  signs and symptoms of liver injury like dark yellow or brown urine; general ill feeling or flu-like symptoms; light-colored stools; loss of appetite; nausea; right upper belly pain; unusually weak or tired; yellowing of the eyes or skin  unusual vaginal bleeding, discharge Side effects that usually do not require medical attention (report to your doctor or health care professional if they continue or are bothersome):  acne  breast pain or tenderness  headache  irregular menstrual bleeding  nausea This list may not describe all possible side effects. Call your doctor for medical advice about side effects. You may report side effects to FDA at 1-800-FDA-1088. Where should I keep my medicine? This drug is given in a hospital or clinic and will not be stored at home. NOTE: This sheet is a summary. It may not cover all possible information. If you have questions about this medicine, talk to your doctor, pharmacist, or health care provider.  2020 Elsevier/Gold Standard (2019-01-01 11:33:04)
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
## 2019-11-19 ENCOUNTER — Encounter: Payer: Self-pay | Admitting: Allergy and Immunology

## 2019-11-19 ENCOUNTER — Other Ambulatory Visit: Payer: Self-pay

## 2019-11-19 ENCOUNTER — Ambulatory Visit (INDEPENDENT_AMBULATORY_CARE_PROVIDER_SITE_OTHER): Payer: 59 | Admitting: Allergy and Immunology

## 2019-11-19 VITALS — BP 112/90 | HR 80 | Temp 98.6°F | Resp 18 | Ht 63.0 in | Wt 219.0 lb

## 2019-11-19 DIAGNOSIS — J3089 Other allergic rhinitis: Secondary | ICD-10-CM | POA: Diagnosis not present

## 2019-11-19 DIAGNOSIS — J4540 Moderate persistent asthma, uncomplicated: Secondary | ICD-10-CM | POA: Diagnosis not present

## 2019-11-19 DIAGNOSIS — K219 Gastro-esophageal reflux disease without esophagitis: Secondary | ICD-10-CM

## 2019-11-19 MED ORDER — BUDESONIDE-FORMOTEROL FUMARATE 160-4.5 MCG/ACT IN AERO: 2.0000 | INHALATION_SPRAY | Freq: Two times a day (BID) | RESPIRATORY_TRACT | 3 refills | Status: DC

## 2019-11-19 MED FILL — SYMBICORT 160-4.5 MCG INH: 160-4.5 | 30 days supply | Qty: 10 | Fill #0

## 2019-11-19 NOTE — Patient Instructions (Addendum)
Moderate persistent asthma Currently well controlled, we will stepdown therapy at this time.  Given history of depression, discontinue montelukast.  For now, continue Symbicort (budesonide/formoterol) 160/4.5 g, 2 inhalations via spacer device twice daily.  Continue albuterol HFA, 1 to 2 inhalations every 6 hours if needed and 15 minutes prior to exercise.  Subjective and objective measures of pulmonary function will be followed and the treatment plan will be adjusted accordingly.  Perennial and seasonal allergic rhinitis  Continue appropriate allergen avoidance measures.  Continue azelastine nasal spray, and Qnasl if needed.  Nasal saline spray (i.e., Simply Saline) is recommended as needed and prior to medicated nasal sprays.  For thick post nasal drainage, add guaifenesin 1200 mg (Mucinex Maximum Strength)  twice daily as needed with adequate hydration as discussed.  GERD  Continue appropriate reflux lifestyle modifications.  Try famotidine (Pepcid) 20 mg twice daily.  If this is insufficient to control symptoms, return to omeprazole (Prilosec) 20 mg daily.   Return in about 4 months (around 03/20/2020), or if symptoms worsen or fail to improve.

## 2019-11-19 NOTE — Assessment & Plan Note (Signed)
   Continue appropriate allergen avoidance measures.  Continue azelastine nasal spray, and Qnasl if needed.  Nasal saline spray (i.e., Simply Saline) is recommended as needed and prior to medicated nasal sprays.  For thick post nasal drainage, add guaifenesin 1200 mg (Mucinex Maximum Strength)  twice daily as needed with adequate hydration as discussed.

## 2019-11-19 NOTE — Progress Notes (Signed)
Follow-up Note  RE: MERCEDE Dean MRN: [id number] DOB: 1980/07/31 Date of Office Visit: 11/19/2019  Primary care provider: Leamon Arnt, MD Referring provider: Leamon Arnt, MD  History of present illness: Jennifer Dean is a 39 y.o. female with persistent asthma, allergic rhinitis, and gastroesophageal reflux presenting today for follow-up.  She was last seen in this clinic in November 2020.  She reports that her nasal allergy symptoms are well controlled if she is using a medicated nasal sprays as prescribed.  If she misses 2 or 3 doses her nasal/sinus symptoms begin to return.  While taking Symbicort 160-4.5 g, 2 inhalations via spacer device twice daily, and montelukast 10 mg daily, she has not required albuterol rescue over the past few months and denies limitations in normal daily activities and nocturnal awakenings due to lower respiratory symptoms.  She has been dealing with depression and is currently taking antidepressants.  Regarding acid reflux, she would like to stop omeprazole if possible because of potential side effects, however if she stopped taking this medication for more than a few days she begins to experience heartburn again.  She has not tried famotidine.  Assessment and plan: Moderate persistent asthma Currently well controlled, we will stepdown therapy at this time.  Given history of depression, discontinue montelukast.  For now, continue Symbicort (budesonide/formoterol) 160/4.5 g, 2 inhalations via spacer device twice daily.  Continue albuterol HFA, 1 to 2 inhalations every 6 hours if needed and 15 minutes prior to exercise.  Subjective and objective measures of pulmonary function will be followed and the treatment plan will be adjusted accordingly.  Perennial and seasonal allergic rhinitis  Continue appropriate allergen avoidance measures.  Continue azelastine nasal spray, and Qnasl if needed.  Nasal saline spray (i.e., Simply Saline) is  recommended as needed and prior to medicated nasal sprays.  For thick post nasal drainage, add guaifenesin 1200 mg (Mucinex Maximum Strength)  twice daily as needed with adequate hydration as discussed.  GERD  Continue appropriate reflux lifestyle modifications.  Try famotidine (Pepcid) 20 mg twice daily.  If this is insufficient to control symptoms, return to omeprazole (Prilosec) 20 mg daily.   Meds ordered this encounter  Medications  . budesonide-formoterol (SYMBICORT) 160-4.5 MCG/ACT inhaler    Sig: Inhale 2 puffs into the lungs 2 (two) times daily.    Dispense:  1 each    Refill:  3    Diagnostics: Spirometry:  Normal with an FEV1 of 90% predicted. This study was performed while the patient was asymptomatic.  Please see scanned spirometry results for details.    Physical examination: Blood pressure 112/90, pulse 80, temperature 98.6 F (37 C), temperature source Oral, resp. rate 18, height 5\' 3"  (1.6 m), weight 219 lb (99.3 kg), SpO2 100 %.  General: Alert, interactive, in no acute distress. HEENT: TMs pearly gray, turbinates mildly edematous without discharge, post-pharynx unremarkable. Neck: Supple without lymphadenopathy. Lungs: Clear to auscultation without wheezing, rhonchi or rales. CV: Normal S1, S2 without murmurs. Skin: Warm and dry, without lesions or rashes.  The following portions of the patient's history were reviewed and updated as appropriate: allergies, current medications, past family history, past medical history, past social history, past surgical history and problem list.  Current Outpatient Medications  Medication Sig Dispense Refill  . albuterol (VENTOLIN HFA) 108 (90 Base) MCG/ACT inhaler Inhale 2 puffs into the lungs every 4 (four) hours as needed for wheezing or shortness of breath. 1 Inhaler 5  . azelastine (ASTELIN) 0.1 %  nasal spray PLACE 1 SPRAY INTO BOTH NOSTRILS 2 TIMES DAILY. 30 mL 5  . budesonide-formoterol (SYMBICORT) 160-4.5 MCG/ACT  inhaler Inhale 2 puffs into the lungs 2 (two) times daily. 1 each 3  . escitalopram (LEXAPRO) 20 MG tablet Take 1 tablet (20 mg total) by mouth daily. 90 tablet 1  . lamoTRIgine 50 MG TBDP One po twice daily. 60 tablet 5  . montelukast (SINGULAIR) 10 MG tablet TAKE 1 TABLET (10 MG TOTAL) BY MOUTH AT BEDTIME. 30 tablet 1  . omeprazole (PRILOSEC) 20 MG capsule Take 1 capsule (20 mg total) by mouth daily. 90 capsule 3  . QNASL 80 MCG/ACT AERS PLACE 1 SPRAY INTO THE NOSE 2 TIMES DAILY. 10.6 g 1  . TRI-LO-SPRINTEC 0.18/0.215/0.25 MG-25 MCG tab TAKE 1 TABLET BY MOUTH DAILY. 28 tablet 3  . Fluticasone-Salmeterol (ADVAIR DISKUS) 250-50 MCG/DOSE AEPB Inhale 1 puff into the lungs 2 (two) times daily. (Patient not taking: Reported on 11/19/2019) 1 each 5   No current facility-administered medications for this visit.    Allergies  Allergen Reactions  . Morphine And Related Nausea And Vomiting  . Codeine Anxiety    Dry mouth, uneasy feeling   Review of systems: Review of systems negative except as noted in HPI / PMHx.  Past Medical History:  Diagnosis Date  . Asthma   . Depression   . Eczema   . 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 History of chickenpox   . Obesity (BMI 30-39.9) 07/26/2012   Overview:  Nl sleep study 2014   . Vision abnormalities     Family History  Problem Relation Age of Onset  . Hypertension Mother   . GER disease Mother   . Eczema Mother   . Hypertension Father   . Post-traumatic stress disorder Father   . Allergic rhinitis Neg Hx   . Angioedema Neg Hx   . Asthma Neg Hx   . Atopy Neg Hx   . Immunodeficiency Neg Hx   . Urticaria Neg Hx     Social History   Socioeconomic History  . Marital status: Divorced    Spouse name: Not on file  . Number of children: 2  . Years of education: Not on file  . Highest education level: Not on file  Occupational History  . Occupation: Herbalist: [employer]    Tobacco Use  . Smoking status: Never Smoker  . Smokeless tobacco: Never Used  Vaping Use  . Vaping Use: Never used  Substance and Sexual Activity  . Alcohol use: No    Comment: ocassionally  . Drug use: No  . Sexual activity: Yes    Birth control/protection: Pill  Other Topics Concern  . Not on file  Social History Narrative  . Not on file   Social Determinants of Health   Financial Resource Strain:   . Difficulty of Paying Living Expenses:   Food Insecurity:   . Worried About Charity fundraiser in the Last Year:   . Arboriculturist in the Last Year:   Transportation Needs:   . Film/video editor (Medical):   Marland Kitchen Lack of Transportation (Non-Medical):   Physical Activity:   . Days of Exercise per Week:   . Minutes of Exercise per Session:   Stress:   . Feeling of Stress :   Social Connections:   . Frequency of Communication with Friends and Family:   . Frequency of  Social Gatherings with Friends and Family:   . Attends Religious Services:   . Active Member of Clubs or Organizations:   . Attends Archivist Meetings:   Marland Kitchen Marital Status:   Intimate Partner Violence:   . Fear of Current or Ex-Partner:   . Emotionally Abused:   Marland Kitchen Physically Abused:   . Sexually Abused:     I appreciate the opportunity to take part in Alean's care. Please do not hesitate to contact me with questions.  Sincerely,   R. Edgar Frisk, MD

## 2019-11-19 NOTE — Assessment & Plan Note (Signed)
   Continue appropriate reflux lifestyle modifications.  Try famotidine (Pepcid) 20 mg twice daily.  If this is insufficient to control symptoms, return to omeprazole (Prilosec) 20 mg daily.

## 2019-11-19 NOTE — Assessment & Plan Note (Addendum)
Currently well controlled, we will stepdown therapy at this time.  Given history of depression, discontinue montelukast.  For now, continue Symbicort (budesonide/formoterol) 160/4.5 g, 2 inhalations via spacer device twice daily.  Continue albuterol HFA, 1 to 2 inhalations every 6 hours if needed and 15 minutes prior to exercise.  Subjective and objective measures of pulmonary function will be followed and the treatment plan will be adjusted accordingly.

## 2019-12-05 ENCOUNTER — Institutional Professional Consult (permissible substitution): Payer: 59 | Admitting: Neurology

## 2019-12-17 ENCOUNTER — Encounter: Payer: Self-pay | Admitting: Family Medicine

## 2019-12-19 NOTE — Telephone Encounter (Signed)
Pt would like to get nexplanon. Please order, start insurance approval if needed; Once we have the implant, can call and schedule.  Thanks.

## 2020-01-13 ENCOUNTER — Telehealth: Payer: Self-pay

## 2020-01-13 NOTE — Telephone Encounter (Signed)
Jennifer Dean from [facility] called requesting for nexplanon form to be refaxed. They need the pharmacy marked/written on the form where the pt wants the prescription sent. Fax number is [phone]. Please advise.

## 2020-01-13 NOTE — Telephone Encounter (Signed)
Form re-faxed

## 2020-01-21 ENCOUNTER — Ambulatory Visit (INDEPENDENT_AMBULATORY_CARE_PROVIDER_SITE_OTHER): Payer: 59 | Admitting: Psychology

## 2020-01-21 DIAGNOSIS — F411 Generalized anxiety disorder: Secondary | ICD-10-CM | POA: Diagnosis not present

## 2020-01-24 ENCOUNTER — Other Ambulatory Visit: Payer: Self-pay | Admitting: Allergy and Immunology

## 2020-01-24 ENCOUNTER — Other Ambulatory Visit: Payer: Self-pay | Admitting: Family Medicine

## 2020-01-24 ENCOUNTER — Encounter: Payer: Self-pay | Admitting: Family Medicine

## 2020-01-24 DIAGNOSIS — Z3041 Encounter for surveillance of contraceptive pills: Secondary | ICD-10-CM

## 2020-01-24 MED FILL — QNASL 80 MCG/ACT AERS: 80 | 30 days supply | Qty: 11 | Fill #0

## 2020-01-24 MED FILL — TRI-LO-SPRINTEC TABLET: 0.18/0.215/ | 28 days supply | Qty: 28 | Fill #1

## 2020-01-28 ENCOUNTER — Ambulatory Visit (INDEPENDENT_AMBULATORY_CARE_PROVIDER_SITE_OTHER): Payer: 59 | Admitting: Psychology

## 2020-01-28 DIAGNOSIS — F4310 Post-traumatic stress disorder, unspecified: Secondary | ICD-10-CM | POA: Diagnosis not present

## 2020-02-04 ENCOUNTER — Ambulatory Visit (INDEPENDENT_AMBULATORY_CARE_PROVIDER_SITE_OTHER): Payer: 59 | Admitting: Psychology

## 2020-02-04 DIAGNOSIS — F4310 Post-traumatic stress disorder, unspecified: Secondary | ICD-10-CM | POA: Diagnosis not present

## 2020-02-11 ENCOUNTER — Ambulatory Visit (INDEPENDENT_AMBULATORY_CARE_PROVIDER_SITE_OTHER): Payer: 59 | Admitting: Psychology

## 2020-02-11 DIAGNOSIS — F4310 Post-traumatic stress disorder, unspecified: Secondary | ICD-10-CM | POA: Diagnosis not present

## 2020-02-17 ENCOUNTER — Other Ambulatory Visit: Payer: Self-pay | Admitting: Allergy and Immunology

## 2020-02-17 MED FILL — AZELASTINE HCL 137 MCG SPRY: 0.1 | 50 days supply | Qty: 30 | Fill #0

## 2020-02-17 MED FILL — ADVAIR 250/50 DISKUS: 250-50 | 30 days supply | Qty: 60 | Fill #0

## 2020-02-17 MED FILL — OMEPRAZOLE DR 20 MG CAPSULE: 20 | 90 days supply | Qty: 90 | Fill #2

## 2020-02-18 ENCOUNTER — Ambulatory Visit (INDEPENDENT_AMBULATORY_CARE_PROVIDER_SITE_OTHER): Payer: 59 | Admitting: Psychology

## 2020-02-18 DIAGNOSIS — F4310 Post-traumatic stress disorder, unspecified: Secondary | ICD-10-CM

## 2020-02-20 MED FILL — TRI-LO-SPRINTEC TABLET: 0.18/0.215/ | 84 days supply | Qty: 84 | Fill #0

## 2020-02-24 ENCOUNTER — Encounter: Payer: Self-pay | Admitting: Family Medicine

## 2020-02-25 ENCOUNTER — Ambulatory Visit (INDEPENDENT_AMBULATORY_CARE_PROVIDER_SITE_OTHER): Payer: 59 | Admitting: Psychology

## 2020-02-25 DIAGNOSIS — F4310 Post-traumatic stress disorder, unspecified: Secondary | ICD-10-CM | POA: Diagnosis not present

## 2020-02-25 MED FILL — ESCITALOPRAM 20 MG TABLET: 20 | 90 days supply | Qty: 90 | Fill #1

## 2020-02-26 ENCOUNTER — Encounter: Payer: Self-pay | Admitting: Family Medicine

## 2020-02-26 ENCOUNTER — Ambulatory Visit (INDEPENDENT_AMBULATORY_CARE_PROVIDER_SITE_OTHER): Payer: 59 | Admitting: Family Medicine

## 2020-02-26 ENCOUNTER — Other Ambulatory Visit: Payer: Self-pay

## 2020-02-26 VITALS — BP 122/80 | HR 103 | Temp 97.8°F

## 2020-02-26 DIAGNOSIS — Z308 Encounter for other contraceptive management: Secondary | ICD-10-CM | POA: Diagnosis not present

## 2020-02-26 DIAGNOSIS — Z30017 Encounter for initial prescription of implantable subdermal contraceptive: Secondary | ICD-10-CM

## 2020-02-26 LAB — POCT URINE PREGNANCY: Preg Test, Ur: NEGATIVE

## 2020-02-26 MED ORDER — ETONOGESTREL 68 MG ~~LOC~~ IMPL
68.0000 mg | DRUG_IMPLANT | Freq: Once | SUBCUTANEOUS | Status: AC
  Administered 2020-02-26: 68 mg via SUBCUTANEOUS

## 2020-02-26 MED ORDER — LIDOCAINE-EPINEPHRINE 1 %-1:100000 IJ SOLN
3.0000 mL | Freq: Once | INTRAMUSCULAR | Status: AC
  Administered 2020-02-26: 3 mL via INTRADERMAL

## 2020-02-26 NOTE — Progress Notes (Signed)
Nexplanon Insertion Procedure Note  PRE-OP DIAGNOSIS: Patient desires long-term, reversible contraception. POST-OP DIAGNOSIS: Same PROCEDURE: Nexplanon placement  PROCEDURE: -Written and verbal informed consent obtained, risks discussed included: bleeding, irregular menses, infection, pain/discomfort, cost for removal. -The appropriate universal timeout was taken and the patient's non-dominant hand was identified. -Patient was instructed to lie supine on the examination table with her non-dominant arm flexed at the elbow and externally rotated so that her hand was underneath her head (or as close as possible). -The insertion site was identified on the inner side of the non-dominant upper arm, 8 -10 cm from medial epicondyle of the humerus and 3-5 cm posterior to the sulcus (groove) between the biceps and triceps muscles. The insertion site was marked with a sterile marker. -A second spot (guiding mark) was made with the sterile marker 5 cm proximal (toward the shoulder) to the mark of the insertion site to serve as a direction guide during the Nexplanon insertion. -The insertion site was confirmed as the correct location on the inner side of the arm. -The skin was cleaned from the insertion site to the guiding mark with an antiseptic solution (Betadine / Chloraprep) and draped in a sterile fashion. -Anesthesia was achieved by injecting 2 mL of 1% lidocaine (with epinephrine) just under the skin along the planned insertion tunnel. Anesthesia confirmed. -The skin was punctured with the tip of the Nexplanon trocar needle slightly angled less than 30. The needle was inserted until the bevel (slanted opening of the tip) was just under the skin (and no further). -The applicator was then lowered to a horizontal position. While lifting the skin with the tip of the needle, the needle was inserted to its full length. Mild resistance was felt but no excessive force was used. The needle slid in easily. -The  applicator was kept in the same position with the needle inserted to its full length. The free hand was used to keep the applicator in the same position. Then the purple slider was unlocked by pushing it slightly down. The purple slider was then moved fully back until it stopped, delivering the Nexplanon capsule subcutaneously. The trocar was removed from the insertion site. -Nexplanon capsule was palpated by provider and patient to assure satisfactory placement. -A Bandage and a pressure pressing were applied to the area. Patient to keep the pressure dressing for 24 hrs and the bandage for 3-5 days. -Anticipatory guidance, as well as standard post-procedure care, was explained. -Return precautions are given. The patient tolerated the procedure well without complications. -Estimated blood loss was less than 0.5 mL Follow-up: 4-6 weeks, at which time the placement will be checked.

## 2020-02-26 NOTE — Patient Instructions (Signed)
Please follow up as scheduled for your next visit with me: 04/07/2020 for your complete physical.   If you have any questions or concerns, please don't hesitate to send me a message via MyChart or call the office at [phone]. Thank you for visiting with [place] today! It's our pleasure caring for you.  Nexplanon Instructions After Insertion   Keep bandage clean and dry for 24 hours   May use ice/Tylenol/Ibuprofen for soreness or pain   If you develop fever, drainage or increased warmth from incision site-contact office immediately  Etonogestrel implant What is this medicine? ETONOGESTREL (et oh noe JES trel) is a contraceptive (birth control) device. It is used to prevent pregnancy. It can be used for up to 3 years. This medicine may be used for other purposes; ask your health care provider or pharmacist if you have questions. COMMON BRAND NAME(S): Implanon, Nexplanon What should I tell my health care provider before I take this medicine? They need to know if you have any of these conditions:  abnormal vaginal bleeding  blood vessel disease or blood clots  breast, cervical, endometrial, ovarian, liver, or uterine cancer  diabetes  gallbladder disease  heart disease or recent heart attack  high blood pressure  high cholesterol or triglycerides  kidney disease  liver disease  migraine headaches  seizures  stroke  tobacco smoker  an unusual or allergic reaction to etonogestrel, anesthetics or antiseptics, other medicines, foods, dyes, or preservatives  pregnant or trying to get pregnant  breast-feeding How should I use this medicine? This device is inserted just under the skin on the inner side of your upper arm by a health care professional. Talk to your pediatrician regarding the use of this medicine in children. Special care may be needed. Overdosage: If you think you have taken too much of this medicine contact a poison control center or emergency room at  once. NOTE: This medicine is only for you. Do not share this medicine with others. What if I miss a dose? This does not apply. What may interact with this medicine? Do not take this medicine with any of the following medications:  amprenavir  fosamprenavir This medicine may also interact with the following medications:  acitretin  aprepitant  armodafinil  bexarotene  bosentan  carbamazepine  certain medicines for fungal infections like fluconazole, ketoconazole, itraconazole and voriconazole  certain medicines to treat hepatitis, HIV or AIDS  cyclosporine  felbamate  griseofulvin  lamotrigine  modafinil  oxcarbazepine  phenobarbital  phenytoin  primidone  rifabutin  rifampin  rifapentine  St. John's wort  topiramate This list may not describe all possible interactions. Give your health care provider a list of all the medicines, herbs, non-prescription drugs, or dietary supplements you use. Also tell them if you smoke, drink alcohol, or use illegal drugs. Some items may interact with your medicine. What should I watch for while using this medicine? This product does not protect you against HIV infection (AIDS) or other sexually transmitted diseases. You should be able to feel the implant by pressing your fingertips over the skin where it was inserted. Contact your doctor if you cannot feel the implant, and use a non-hormonal birth control method (such as condoms) until your doctor confirms that the implant is in place. Contact your doctor if you think that the implant may have broken or become bent while in your arm. You will receive a user card from your health care provider after the implant is inserted. The card is a record  of the location of the implant in your upper arm and when it should be removed. Keep this card with your health records. What side effects may I notice from receiving this medicine? Side effects that you should report to your doctor  or health care professional as soon as possible:  allergic reactions like skin rash, itching or hives, swelling of the face, lips, or tongue  breast lumps, breast tissue changes, or discharge  breathing problems  changes in emotions or moods  coughing up blood  if you feel that the implant may have broken or bent while in your arm  high blood pressure  pain, irritation, swelling, or bruising at the insertion site  scar at site of insertion  signs of infection at the insertion site such as fever, and skin redness, pain or discharge  signs and symptoms of a blood clot such as breathing problems; changes in vision; chest pain; severe, sudden headache; pain, swelling, warmth in the leg; trouble speaking; sudden numbness or weakness of the face, arm or leg  signs and symptoms of liver injury like dark yellow or brown urine; general ill feeling or flu-like symptoms; light-colored stools; loss of appetite; nausea; right upper belly pain; unusually weak or tired; yellowing of the eyes or skin  unusual vaginal bleeding, discharge Side effects that usually do not require medical attention (report to your doctor or health care professional if they continue or are bothersome):  acne  breast pain or tenderness  headache  irregular menstrual bleeding  nausea This list may not describe all possible side effects. Call your doctor for medical advice about side effects. You may report side effects to FDA at 1-800-FDA-1088. Where should I keep my medicine? This drug is given in a hospital or clinic and will not be stored at home. NOTE: This sheet is a summary. It may not cover all possible information. If you have questions about this medicine, talk to your doctor, pharmacist, or health care provider.  2020 Elsevier/Gold Standard (2019-01-01 11:33:04)

## 2020-03-03 ENCOUNTER — Ambulatory Visit (INDEPENDENT_AMBULATORY_CARE_PROVIDER_SITE_OTHER): Payer: 59 | Admitting: Psychology

## 2020-03-03 DIAGNOSIS — F4310 Post-traumatic stress disorder, unspecified: Secondary | ICD-10-CM | POA: Diagnosis not present

## 2020-03-10 ENCOUNTER — Ambulatory Visit (INDEPENDENT_AMBULATORY_CARE_PROVIDER_SITE_OTHER): Payer: 59 | Admitting: Psychology

## 2020-03-10 DIAGNOSIS — F4310 Post-traumatic stress disorder, unspecified: Secondary | ICD-10-CM

## 2020-03-17 ENCOUNTER — Ambulatory Visit (INDEPENDENT_AMBULATORY_CARE_PROVIDER_SITE_OTHER): Payer: 59 | Admitting: Psychology

## 2020-03-17 DIAGNOSIS — F4310 Post-traumatic stress disorder, unspecified: Secondary | ICD-10-CM | POA: Diagnosis not present

## 2020-03-24 ENCOUNTER — Ambulatory Visit: Payer: 59 | Admitting: Psychology

## 2020-03-26 ENCOUNTER — Other Ambulatory Visit: Payer: Self-pay | Admitting: Allergy and Immunology

## 2020-03-26 ENCOUNTER — Encounter: Payer: Self-pay | Admitting: Allergy and Immunology

## 2020-03-26 ENCOUNTER — Other Ambulatory Visit: Payer: Self-pay

## 2020-03-26 ENCOUNTER — Ambulatory Visit (INDEPENDENT_AMBULATORY_CARE_PROVIDER_SITE_OTHER): Payer: 59 | Admitting: Allergy and Immunology

## 2020-03-26 DIAGNOSIS — J4540 Moderate persistent asthma, uncomplicated: Secondary | ICD-10-CM | POA: Diagnosis not present

## 2020-03-26 DIAGNOSIS — J3089 Other allergic rhinitis: Secondary | ICD-10-CM

## 2020-03-26 MED ORDER — ALBUTEROL SULFATE HFA 108 (90 BASE) MCG/ACT IN AERS: 2.0000 | INHALATION_SPRAY | RESPIRATORY_TRACT | 5 refills | Status: DC | PRN

## 2020-03-26 MED FILL — ALBUTEROL SULFATE HFA 108 (: 108 (90 BAS | 17 days supply | Qty: 18 | Fill #0

## 2020-03-26 NOTE — Progress Notes (Signed)
Follow-up Note  RE: MASIAH LEWING MRN: [id number] DOB: Nov 08, 1980 Date of Office Visit: 03/26/2020  Primary care provider: Leamon Arnt, MD Referring provider: Leamon Arnt, MD  History of present illness: Jennifer Dean is a 39 y.o. female with persistent asthma and allergic rhinitis presenting today for follow-up.  She was last seen in this clinic on November 19, 2019.  She reports that in the interval since her previous visit she has been taking Advair 250/50 g, 1 inhalation daily.  On occasion, she experiences some shortness of breath with mild/moderate exertion.  She was unaware that she was supposed to be taking 1 inhalation of Advair twice daily.  She does not complain of nocturnal awakenings due to lower respiratory symptoms as has not required prednisone or ER/urgent care visits since her previous visit in this office.  Her nasal symptoms are stable/well-controlled.  She uses Qnasl and/or azelastine as needed.  Assessment and plan: Moderate persistent asthma  Increase Advair 250/50 to 1 inhalation twice daily.  Rinse mouth after use.  Continue albuterol HFA, 1 to 2 inhalations every 4-6 hours if needed.  Subjective and objective measures of pulmonary function will be followed and the treatment plan will be adjusted accordingly.  Perennial and seasonal allergic rhinitis  Continue appropriate allergen avoidance measures.  Continue azelastine nasal spray, and Qnasl if needed.  Nasal saline spray (i.e., Simply Saline) is recommended as needed and prior to medicated nasal sprays.  For thick post nasal drainage, add guaifenesin 1200 mg (Mucinex Maximum Strength)  twice daily as needed with adequate hydration.   Meds ordered this encounter  Medications  . albuterol (VENTOLIN HFA) 108 (90 Base) MCG/ACT inhaler    Sig: Inhale 2 puffs into the lungs every 4 (four) hours as needed for wheezing or shortness of breath.    Dispense:  1 each    Refill:  5     Diagnostics: Due to Covid precautions, spirometry was not performed today as the patient's symptoms are well controlled today and pulmonary exam was normal.    Physical examination: Blood pressure 122/82, pulse 84, temperature 98.4 F (36.9 C), temperature source Tympanic, resp. rate 16, SpO2 98 %.  General: Alert, interactive, in no acute distress. HEENT: TMs pearly gray, turbinates mildly edematous without discharge, post-pharynx unremarkable. Neck: Supple without lymphadenopathy. Lungs: Clear to auscultation without wheezing, rhonchi or rales. CV: Normal S1, S2 without murmurs. Skin: Warm and dry, without lesions or rashes.  The following portions of the patient's history were reviewed and updated as appropriate: allergies, current medications, past family history, past medical history, past social history, past surgical history and problem list.  Current Outpatient Medications  Medication Sig Dispense Refill  . azelastine (ASTELIN) 0.1 % nasal spray PLACE 1 SPRAY INTO BOTH NOSTRILS 2 TIMES DAILY. 30 mL 5  . escitalopram (LEXAPRO) 20 MG tablet Take 1 tablet (20 mg total) by mouth daily. 90 tablet 1  . Fluticasone-Salmeterol (ADVAIR DISKUS) 250-50 MCG/DOSE AEPB Inhale 1 puff into the lungs 2 (two) times daily. 1 each 5  . omeprazole (PRILOSEC) 20 MG capsule Take 1 capsule (20 mg total) by mouth daily. 90 capsule 3  . albuterol (VENTOLIN HFA) 108 (90 Base) MCG/ACT inhaler Inhale 2 puffs into the lungs every 4 (four) hours as needed for wheezing or shortness of breath. 1 each 5  . lamoTRIgine 50 MG TBDP One po twice daily. 60 tablet 5  . QNASL 80 MCG/ACT AERS PLACE 1 SPRAY INTO THE NOSE 2 TIMES  DAILY. 10.6 g 3  . TRI-LO-SPRINTEC 0.18/0.215/0.25 MG-25 MCG tab TAKE 1 TABLET BY MOUTH DAILY. 28 tablet 3   No current facility-administered medications for this visit.    Allergies  Allergen Reactions  . Morphine And Related Nausea And Vomiting  . Codeine Anxiety    Dry mouth, uneasy  feeling    I appreciate the opportunity to take part in Shawnika's care. Please do not hesitate to contact me with questions.  Sincerely,   R. Edgar Frisk, MD

## 2020-03-26 NOTE — Assessment & Plan Note (Signed)
   Increase Advair 250/50 to 1 inhalation twice daily.  Rinse mouth after use.  Continue albuterol HFA, 1 to 2 inhalations every 4-6 hours if needed.  Subjective and objective measures of pulmonary function will be followed and the treatment plan will be adjusted accordingly.

## 2020-03-26 NOTE — Patient Instructions (Addendum)
Moderate persistent asthma  Increase Advair 250/50 to 1 inhalation twice daily.  Rinse mouth after use.  Continue albuterol HFA, 1 to 2 inhalations every 4-6 hours if needed.  Subjective and objective measures of pulmonary function will be followed and the treatment plan will be adjusted accordingly.  Perennial and seasonal allergic rhinitis  Continue appropriate allergen avoidance measures.  Continue azelastine nasal spray, and Qnasl if needed.  Nasal saline spray (i.e., Simply Saline) is recommended as needed and prior to medicated nasal sprays.  For thick post nasal drainage, add guaifenesin 1200 mg (Mucinex Maximum Strength)  twice daily as needed with adequate hydration.   Return in about 5 months (around 08/24/2020), or if symptoms worsen or fail to improve.

## 2020-03-26 NOTE — Assessment & Plan Note (Addendum)
   Continue appropriate allergen avoidance measures.  Continue azelastine nasal spray, and Qnasl if needed.  Nasal saline spray (i.e., Simply Saline) is recommended as needed and prior to medicated nasal sprays.  For thick post nasal drainage, add guaifenesin 1200 mg (Mucinex Maximum Strength)  twice daily as needed with adequate hydration.

## 2020-03-31 ENCOUNTER — Ambulatory Visit: Payer: 59 | Admitting: Psychology

## 2020-04-07 ENCOUNTER — Other Ambulatory Visit (HOSPITAL_COMMUNITY)
Admission: RE | Admit: 2020-04-07 | Discharge: 2020-04-07 | Disposition: A | Discharge status: Home / Self Care | Payer: 59 | Source: Ambulatory Visit | Attending: Family Medicine | Admitting: Family Medicine

## 2020-04-07 ENCOUNTER — Other Ambulatory Visit: Payer: Self-pay | Admitting: Family Medicine

## 2020-04-07 ENCOUNTER — Ambulatory Visit (INDEPENDENT_AMBULATORY_CARE_PROVIDER_SITE_OTHER): Payer: 59 | Admitting: Family Medicine

## 2020-04-07 ENCOUNTER — Ambulatory Visit: Payer: 59 | Admitting: Psychology

## 2020-04-07 ENCOUNTER — Encounter: Payer: Self-pay | Admitting: Family Medicine

## 2020-04-07 ENCOUNTER — Other Ambulatory Visit: Payer: Self-pay

## 2020-04-07 VITALS — BP 118/80 | HR 75 | Temp 98.1°F | Resp 18 | Ht 63.0 in | Wt 225.2 lb

## 2020-04-07 DIAGNOSIS — N898 Other specified noninflammatory disorders of vagina: Secondary | ICD-10-CM | POA: Insufficient documentation

## 2020-04-07 DIAGNOSIS — G4089 Other seizures: Secondary | ICD-10-CM | POA: Insufficient documentation

## 2020-04-07 DIAGNOSIS — B977 Papillomavirus as the cause of diseases classified elsewhere: Secondary | ICD-10-CM

## 2020-04-07 DIAGNOSIS — Z0000 Encounter for general adult medical examination without abnormal findings: Secondary | ICD-10-CM | POA: Diagnosis not present

## 2020-04-07 DIAGNOSIS — N72 Inflammatory disease of cervix uteri: Secondary | ICD-10-CM

## 2020-04-07 DIAGNOSIS — F445 Conversion disorder with seizures or convulsions: Secondary | ICD-10-CM | POA: Diagnosis not present

## 2020-04-07 DIAGNOSIS — F329 Major depressive disorder, single episode, unspecified: Secondary | ICD-10-CM | POA: Diagnosis not present

## 2020-04-07 DIAGNOSIS — E669 Obesity, unspecified: Secondary | ICD-10-CM

## 2020-04-07 DIAGNOSIS — Z1159 Encounter for screening for other viral diseases: Secondary | ICD-10-CM | POA: Diagnosis not present

## 2020-04-07 DIAGNOSIS — Z124 Encounter for screening for malignant neoplasm of cervix: Secondary | ICD-10-CM

## 2020-04-07 DIAGNOSIS — Z789 Other specified health status: Secondary | ICD-10-CM

## 2020-04-07 LAB — CBC WITH DIFFERENTIAL/PLATELET
Basophils Absolute: 0.1 10*3/uL (ref 0.0–0.1)
Basophils Relative: 1.1 % (ref 0.0–3.0)
Eosinophils Absolute: 0.2 10*3/uL (ref 0.0–0.7)
Eosinophils Relative: 4.5 % (ref 0.0–5.0)
HCT: 40.5 % (ref 36.0–46.0)
Hemoglobin: 13.4 g/dL (ref 12.0–15.0)
Lymphocytes Relative: 40.2 % (ref 12.0–46.0)
Lymphs Abs: 2.1 10*3/uL (ref 0.7–4.0)
MCHC: 33.1 g/dL (ref 30.0–36.0)
MCV: 94 fl (ref 78.0–100.0)
Monocytes Absolute: 0.5 10*3/uL (ref 0.1–1.0)
Monocytes Relative: 8.5 % (ref 3.0–12.0)
Neutro Abs: 2.4 10*3/uL (ref 1.4–7.7)
Neutrophils Relative %: 45.7 % (ref 43.0–77.0)
Platelets: 296 10*3/uL (ref 150.0–400.0)
RBC: 4.31 Mil/uL (ref 3.87–5.11)
RDW: 12.9 % (ref 11.5–15.5)
WBC: 5.3 10*3/uL (ref 4.0–10.5)

## 2020-04-07 LAB — COMPREHENSIVE METABOLIC PANEL
ALT: 11 U/L (ref 0–35)
AST: 20 U/L (ref 0–37)
Albumin: 4.1 g/dL (ref 3.5–5.2)
Alkaline Phosphatase: 61 U/L (ref 39–117)
BUN: 11 mg/dL (ref 6–23)
CO2: 27 mEq/L (ref 19–32)
Calcium: 8.6 mg/dL (ref 8.4–10.5)
Chloride: 102 mEq/L (ref 96–112)
Creatinine, Ser: 0.77 mg/dL (ref 0.40–1.20)
GFR: 97.43 mL/min (ref >60.00)
Glucose, Bld: 80 mg/dL (ref 70–99)
Potassium: 3.7 mEq/L (ref 3.5–5.1)
Sodium: 136 mEq/L (ref 135–145)
Total Bilirubin: 0.5 mg/dL (ref 0.2–1.2)
Total Protein: 7.1 g/dL (ref 6.0–8.3)

## 2020-04-07 LAB — LIPID PANEL
Cholesterol: 137 mg/dL (ref 0–200)
HDL: 53.3 mg/dL (ref >39.00)
LDL Cholesterol: 74 mg/dL (ref 0–99)
NonHDL: 83.79
Total CHOL/HDL Ratio: 3
Triglycerides: 47 mg/dL (ref 0.0–149.0)
VLDL: 9.4 mg/dL (ref 0.0–40.0)

## 2020-04-07 LAB — TSH: TSH: 1.59 u[IU]/mL (ref 0.35–4.50)

## 2020-04-07 MED ORDER — ESCITALOPRAM OXALATE 20 MG PO TABS: 20.0000 mg | ORAL_TABLET | Freq: Every day | ORAL | 3 refills | Status: DC
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
## 2020-04-07 NOTE — Patient Instructions (Signed)
Please return in 6 months for mood recheck.   I will release your lab results to you on your MyChart account with further instructions. Please reply with any questions.  Please follow up with Dr. Epimenio Foot, neurology, if your headaches or spells return or worsen again.   If you have any questions or concerns, please don't hesitate to send me a message via MyChart or call the office at [phone]. Thank you for visiting with [place] today! It's our pleasure caring for you.   Fat and Cholesterol Restricted Eating Plan Getting too much fat and cholesterol in your diet may cause health problems. Choosing the right foods helps keep your fat and cholesterol at normal levels. This can keep you from getting certain diseases. Your doctor may recommend an eating plan that includes:  Total fat: ______% or less of total calories a day.  Saturated fat: ______% or less of total calories a day.  Cholesterol: less than _________mg a day.  Fiber: ______g a day. What are tips for following this plan? Meal planning  At meals, divide your plate into four equal parts: ? Fill one-half of your plate with vegetables and green salads. ? Fill one-fourth of your plate with whole grains. ? Fill one-fourth of your plate with low-fat (lean) protein foods.  Eat fish that is high in omega-3 fats at least two times a week. This includes mackerel, tuna, sardines, and salmon.  Eat foods that are high in fiber, such as whole grains, beans, apples, broccoli, carrots, peas, and barley. General tips   Work with your doctor to lose weight if you need to.  Avoid: ? Foods with added sugar. ? Fried foods. ? Foods with partially hydrogenated oils.  Limit alcohol intake to no more than 1 drink a day for nonpregnant women and 2 drinks a day for men. One drink equals 12 oz of beer, 5 oz of wine, or 1 oz of hard liquor. Reading food labels  Check food labels for: ? Trans fats. ? Partially hydrogenated oils. ? Saturated fat  (g) in each serving. ? Cholesterol (mg) in each serving. ? Fiber (g) in each serving.  Choose foods with healthy fats, such as: ? Monounsaturated fats. ? Polyunsaturated fats. ? Omega-3 fats.  Choose grain products that have whole grains. Look for the word "whole" as the first word in the ingredient list. Cooking  Cook foods using low-fat methods. These include baking, boiling, grilling, and broiling.  Eat more home-cooked foods. Eat at restaurants and buffets less often.  Avoid cooking using saturated fats, such as butter, cream, palm oil, palm kernel oil, and coconut oil. Recommended foods  Fruits  All fresh, canned (in natural juice), or frozen fruits. Vegetables  Fresh or frozen vegetables (raw, steamed, roasted, or grilled). Green salads. Grains  Whole grains, such as whole wheat or whole grain breads, crackers, cereals, and pasta. Unsweetened oatmeal, bulgur, barley, quinoa, or brown rice. Corn or whole wheat flour tortillas. Meats and other protein foods  Ground beef (85% or leaner), grass-fed beef, or beef trimmed of fat. Skinless chicken or [place]. Ground chicken or [place]. Pork trimmed of fat. All fish and seafood. Egg whites. Dried beans, peas, or lentils. Unsalted nuts or seeds. Unsalted canned beans. Nut butters without added sugar or oil. Dairy  Low-fat or nonfat dairy products, such as skim or 1% milk, 2% or reduced-fat cheeses, low-fat and fat-free ricotta or cottage cheese, or plain low-fat and nonfat yogurt. Fats and oils  Tub margarine without trans fats. Light  or reduced-fat mayonnaise and salad dressings. Avocado. Olive, canola, sesame, or safflower oils. The items listed above may not be a complete list of foods and beverages you can eat. Contact a dietitian for more information. Foods to avoid Fruits  Canned fruit in heavy syrup. Fruit in cream or butter sauce. Fried fruit. Vegetables  Vegetables cooked in cheese, cream, or butter sauce. Fried  vegetables. Grains  White bread. White pasta. White rice. Cornbread. Bagels, pastries, and croissants. Crackers and snack foods that contain trans fat and hydrogenated oils. Meats and other protein foods  Fatty cuts of meat. Ribs, chicken wings, bacon, sausage, bologna, salami, chitterlings, fatback, hot dogs, bratwurst, and packaged lunch meats. Liver and organ meats. Whole eggs and egg yolks. Chicken and [place] with skin. Fried meat. Dairy  Whole or 2% milk, cream, half-and-half, and cream cheese. Whole milk cheeses. Whole-fat or sweetened yogurt. Full-fat cheeses. Nondairy creamers and whipped toppings. Processed cheese, cheese spreads, and cheese curds. Beverages  Alcohol. Sugar-sweetened drinks such as sodas, lemonade, and fruit drinks. Fats and oils  Butter, stick margarine, lard, shortening, ghee, or bacon fat. Coconut, palm kernel, and palm oils. Sweets and desserts  Corn syrup, sugars, honey, and molasses. Candy. Jam and jelly. Syrup. Sweetened cereals. Cookies, pies, cakes, donuts, muffins, and ice cream. The items listed above may not be a complete list of foods and beverages you should avoid. Contact a dietitian for more information. Summary  Choosing the right foods helps keep your fat and cholesterol at normal levels. This can keep you from getting certain diseases.  At meals, fill one-half of your plate with vegetables and green salads.  Eat high-fiber foods, like whole grains, beans, apples, carrots, peas, and barley.  Limit added sugar, saturated fats, alcohol, and fried foods. This information is not intended to replace advice given to you by your health care provider. Make sure you discuss any questions you have with your health care provider. Document Revised: 11/22/2017 Document Reviewed: 12/06/2016 Elsevier Patient Education  [place].

## 2020-04-08 ENCOUNTER — Other Ambulatory Visit: Payer: Self-pay | Admitting: Family Medicine

## 2020-04-08 LAB — CERVICOVAGINAL ANCILLARY ONLY
Bacterial Vaginitis (gardnerella): POSITIVE — AB
Candida Glabrata: NEGATIVE
Candida Vaginitis: NEGATIVE
Chlamydia: NEGATIVE
Comment: NEGATIVE
Comment: NEGATIVE
Comment: NEGATIVE
Comment: NEGATIVE
Comment: NEGATIVE
Comment: NORMAL
Neisseria Gonorrhea: NEGATIVE
Trichomonas: NEGATIVE

## 2020-04-08 LAB — HEPATITIS C ANTIBODY
Hepatitis C Ab: NONREACTIVE
SIGNAL TO CUT-OFF: 0.01 (ref <1.00)

## 2020-04-08 MED ORDER — METRONIDAZOLE 500 MG PO TABS: 500.0000 mg | ORAL_TABLET | Freq: Two times a day (BID) | ORAL | 0 refills | Status: DC

## 2020-04-08 MED FILL — metroNIDAZOLE 500 MG TABS: 500 | 7 days supply | Qty: 14 | Fill #0

## 2020-04-08 NOTE — Addendum Note (Signed)
Addended by: Asencion Partridge on: 04/08/2020 04:49 PM   Modules accepted: Orders

## 2020-04-13 LAB — CYTOLOGY - PAP
Comment: NEGATIVE
Diagnosis: NEGATIVE
High risk HPV: NEGATIVE

## 2020-04-14 ENCOUNTER — Ambulatory Visit (INDEPENDENT_AMBULATORY_CARE_PROVIDER_SITE_OTHER): Payer: 59 | Admitting: Psychology

## 2020-04-14 DIAGNOSIS — F4310 Post-traumatic stress disorder, unspecified: Secondary | ICD-10-CM | POA: Diagnosis not present

## 2020-04-21 ENCOUNTER — Ambulatory Visit: Payer: 59 | Admitting: Psychology

## 2020-04-28 ENCOUNTER — Ambulatory Visit (INDEPENDENT_AMBULATORY_CARE_PROVIDER_SITE_OTHER): Payer: 59 | Admitting: Psychology

## 2020-04-28 DIAGNOSIS — F4310 Post-traumatic stress disorder, unspecified: Secondary | ICD-10-CM

## 2020-05-01 DIAGNOSIS — H1131 Conjunctival hemorrhage, right eye: Secondary | ICD-10-CM | POA: Diagnosis not present

## 2020-05-05 ENCOUNTER — Ambulatory Visit: Payer: 59 | Admitting: Psychology

## 2020-05-12 ENCOUNTER — Ambulatory Visit (INDEPENDENT_AMBULATORY_CARE_PROVIDER_SITE_OTHER): Payer: 59 | Admitting: Psychology

## 2020-05-12 DIAGNOSIS — F4310 Post-traumatic stress disorder, unspecified: Secondary | ICD-10-CM

## 2020-05-14 MED FILL — QNASL 80 MCG/ACT AERS: 80 | 30 days supply | Qty: 11 | Fill #1

## 2020-05-19 ENCOUNTER — Encounter: Payer: Self-pay | Admitting: Family Medicine

## 2020-05-19 ENCOUNTER — Ambulatory Visit (INDEPENDENT_AMBULATORY_CARE_PROVIDER_SITE_OTHER): Payer: 59 | Admitting: Psychology

## 2020-05-19 DIAGNOSIS — F4310 Post-traumatic stress disorder, unspecified: Secondary | ICD-10-CM | POA: Diagnosis not present

## 2020-05-26 ENCOUNTER — Ambulatory Visit (INDEPENDENT_AMBULATORY_CARE_PROVIDER_SITE_OTHER): Payer: 59 | Admitting: Psychology

## 2020-05-26 DIAGNOSIS — F4310 Post-traumatic stress disorder, unspecified: Secondary | ICD-10-CM

## 2020-06-02 ENCOUNTER — Ambulatory Visit (INDEPENDENT_AMBULATORY_CARE_PROVIDER_SITE_OTHER): Payer: 59 | Admitting: Psychology

## 2020-06-02 DIAGNOSIS — F4310 Post-traumatic stress disorder, unspecified: Secondary | ICD-10-CM

## 2020-06-09 ENCOUNTER — Ambulatory Visit: Payer: 59 | Admitting: Psychology

## 2020-06-16 ENCOUNTER — Ambulatory Visit (INDEPENDENT_AMBULATORY_CARE_PROVIDER_SITE_OTHER): Payer: 59 | Admitting: Psychology

## 2020-06-16 DIAGNOSIS — F4310 Post-traumatic stress disorder, unspecified: Secondary | ICD-10-CM

## 2020-06-17 ENCOUNTER — Other Ambulatory Visit: Payer: Self-pay | Admitting: Family Medicine

## 2020-06-23 ENCOUNTER — Ambulatory Visit (INDEPENDENT_AMBULATORY_CARE_PROVIDER_SITE_OTHER): Payer: 59 | Admitting: Psychology

## 2020-06-23 DIAGNOSIS — F4310 Post-traumatic stress disorder, unspecified: Secondary | ICD-10-CM | POA: Diagnosis not present

## 2020-06-25 MED FILL — AZELASTINE HCL 137 MCG SPRY: 0.1 | 50 days supply | Qty: 30 | Fill #1

## 2020-06-25 MED FILL — ADVAIR 250/50 DISKUS: 250-50 | 30 days supply | Qty: 60 | Fill #1

## 2020-06-30 ENCOUNTER — Ambulatory Visit: Payer: 59 | Admitting: Psychology

## 2020-07-07 ENCOUNTER — Ambulatory Visit (INDEPENDENT_AMBULATORY_CARE_PROVIDER_SITE_OTHER): Payer: 59 | Admitting: Psychology

## 2020-07-07 DIAGNOSIS — F4310 Post-traumatic stress disorder, unspecified: Secondary | ICD-10-CM

## 2020-07-14 ENCOUNTER — Ambulatory Visit (INDEPENDENT_AMBULATORY_CARE_PROVIDER_SITE_OTHER): Payer: 59 | Admitting: Psychology

## 2020-07-14 DIAGNOSIS — F4310 Post-traumatic stress disorder, unspecified: Secondary | ICD-10-CM | POA: Diagnosis not present

## 2020-07-21 ENCOUNTER — Other Ambulatory Visit (HOSPITAL_COMMUNITY): Payer: Self-pay

## 2020-07-21 ENCOUNTER — Other Ambulatory Visit: Payer: Self-pay

## 2020-07-21 ENCOUNTER — Encounter: Payer: Self-pay | Admitting: Allergy and Immunology

## 2020-07-21 ENCOUNTER — Ambulatory Visit: Payer: 59 | Admitting: Allergy and Immunology

## 2020-07-21 VITALS — BP 126/86 | HR 73 | Temp 98.0°F | Ht 63.0 in | Wt 222.8 lb

## 2020-07-21 DIAGNOSIS — J3089 Other allergic rhinitis: Secondary | ICD-10-CM | POA: Diagnosis not present

## 2020-07-21 DIAGNOSIS — J4540 Moderate persistent asthma, uncomplicated: Secondary | ICD-10-CM

## 2020-07-21 MED ORDER — FLUTICASONE-SALMETEROL 250-50 MCG/DOSE IN AEPB
1.0000 | INHALATION_SPRAY | Freq: Two times a day (BID) | RESPIRATORY_TRACT | 5 refills | Status: DC
  Filled 2020-07-21: qty 60, 30d supply, fill #0

## 2020-07-21 MED ORDER — ALBUTEROL SULFATE HFA 108 (90 BASE) MCG/ACT IN AERS
2.0000 | INHALATION_SPRAY | RESPIRATORY_TRACT | 5 refills | Status: DC | PRN
  Filled 2020-07-21: qty 18, 17d supply, fill #0

## 2020-07-21 MED ORDER — AZELASTINE HCL 0.1 % NA SOLN
NASAL | 5 refills | Status: DC
  Filled 2020-07-21 – 2020-11-18 (×2): qty 30, 50d supply, fill #0

## 2020-07-21 MED ORDER — BECLOMETHASONE DIPROPIONATE 80 MCG/ACT NA AERS
INHALATION_SPRAY | NASAL | 3 refills | Status: DC
  Filled 2020-07-21 – 2020-08-12 (×2): qty 10.6, 30d supply, fill #0
  Filled 2020-11-13: qty 10.6, 30d supply, fill #1
  Filled 2021-02-15: qty 10.6, 30d supply, fill #2
  Filled 2021-05-16: qty 10.6, 30d supply, fill #3

## 2020-07-21 NOTE — Patient Instructions (Addendum)
  1.  Continue Advair 250-1 inhalation 1-2 times per day depending on disease activity.  If doing well can taper down to 3-5 times per week  2.  Continue Qnasl 80 -1-2 puffs each nostril 1 time per day.  If doing well can taper down to 3-5 times per week  3.  If needed:   A.  Azelastine -1-2 sprays each nostril 1-2 times per day  B.  Albuterol HFA -2 inhalations every 4-6 hours  C.  OTC nasal saline  D.  OTC antihistamine  4.  Return to clinic in 6 months or earlier if problem

## 2020-07-21 NOTE — Progress Notes (Signed)
[facility]   Follow-up Note  Referring Provider: Leamon Arnt, MD Primary Provider: Leamon Arnt, MD Date of Office Visit: 07/21/2020  Subjective:   Jennifer Dean (DOB: 02/25/1981) is a 40 y.o. female who returns to the [facility] on 07/21/2020 in re-evaluation of the following:  HPI: Jennifer Dean returns to this clinic in evaluation of asthma and allergic rhinitis.  Her last visit to this clinic was with Dr. Verlin Fester on 26 March 2020.  She has had excellent control of her asthma not requiring a systemic steroid to treat an exacerbation and rare use of a short acting bronchodilator and no limitation on ability to exercise while she continues to use Advair mostly 1 time per day.  Her nose has really been doing quite well while using Qnasl on a consistent basis and also adding in nasal antihistamine.  She has not required an antibiotic to treat an episode of sinusitis.  She has received 2 Pfizer COVID vaccines and a flu vaccine.  Allergies as of 07/21/2020      Reactions   Morphine And Related Nausea And Vomiting   Codeine Anxiety   Dry mouth, uneasy feeling      Medication List      albuterol 108 (90 Base) MCG/ACT inhaler Commonly known as: VENTOLIN HFA INHALE 2 PUFFS INTO THE LUNGS EVERY 4 (FOUR) HOURS AS NEEDED FOR WHEEZING OR SHORTNESS OF BREATH.   azelastine 0.1 % nasal spray Commonly known as: ASTELIN PLACE 1 SPRAY INTO BOTH NOSTRILS 2 TIMES DAILY.   Beclomethasone Dipropionate 80 MCG/ACT Aers PLACE 1 SPRAY INTO THE NOSE 2 TIMES DAILY.   escitalopram 20 MG tablet Commonly known as: LEXAPRO TAKE 1 TABLET (20 MG TOTAL) BY MOUTH DAILY.   Fluticasone-Salmeterol 250-50 MCG/DOSE Aepb Commonly known as: ADVAIR Inhale 1 puff into the lungs 2 (two) times daily.   metroNIDAZOLE 500 MG tablet Commonly known as: FLAGYL TAKE 1 TABLET (500 MG TOTAL) BY MOUTH 2 (TWO) TIMES DAILY FOR 7 DAYS.    omeprazole 20 MG capsule Commonly known as: PRILOSEC TAKE 1 CAPSULE (20 MG TOTAL) BY MOUTH DAILY.       Past Medical History:  Diagnosis Date  . Asthma   . Depression   . Eczema   . 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 History of chickenpox   . Obesity (BMI 30-39.9) 07/26/2012   Overview:  Nl sleep study 2014   . Vision abnormalities     Past Surgical History:  Procedure Laterality Date  . WISDOM TOOTH EXTRACTION      Review of systems negative except as noted in HPI / PMHx or noted below:  Review of Systems  Constitutional: Negative.   HENT: Negative.   Eyes: Negative.   Respiratory: Negative.   Cardiovascular: Negative.   Gastrointestinal: Negative.   Genitourinary: Negative.   Musculoskeletal: Negative.   Skin: Negative.   Neurological: Negative.   Endo/Heme/Allergies: Negative.   Psychiatric/Behavioral: Negative.      Objective:   Vitals:   07/21/20 1523  BP: 126/86  Pulse: 73  Temp: 98 F (36.7 C)  SpO2: 98%   Height: 5\' 3"  (160 cm)  Weight: 222 lb 12.8 oz (101.1 kg)   Physical Exam Constitutional:      Appearance: She is not diaphoretic.  HENT:     Head: Normocephalic.     Right Ear: Tympanic membrane, ear canal and external ear  normal.     Left Ear: Tympanic membrane, ear canal and external ear normal.     Nose: Nose normal. No mucosal edema or rhinorrhea.     Mouth/Throat:     Pharynx: Uvula midline. No oropharyngeal exudate.  Eyes:     Conjunctiva/sclera: Conjunctivae normal.  Neck:     Thyroid: No thyromegaly.     Trachea: Trachea normal. No tracheal tenderness or tracheal deviation.  Cardiovascular:     Rate and Rhythm: Normal rate and regular rhythm.     Heart sounds: Normal heart sounds, S1 normal and S2 normal. No murmur heard.   Pulmonary:     Effort: No respiratory distress.     Breath sounds: Normal breath sounds. No stridor. No wheezing or rales.  Lymphadenopathy:     Head:      Right side of head: No tonsillar adenopathy.     Left side of head: No tonsillar adenopathy.     Cervical: No cervical adenopathy.  Skin:    Findings: No erythema or rash.     Nails: There is no clubbing.  Neurological:     Mental Status: She is alert.     Diagnostics:    Spirometry was performed and demonstrated an FEV1 of 1.91 at 77 % of predicted.  Assessment and Plan:   1. Moderate persistent asthma without complication   2. Perennial and seasonal allergic rhinitis     1.  Continue Advair 250-1 inhalation 1-2 times per day depending on disease activity.  If doing well can taper down to 3-5 times per week  2.  Continue Qnasl 80 -1-2 puffs each nostril 1 time per day.  If doing well can taper down to 3-5 times per week  3.  If needed:   A.  Azelastine -1-2 sprays each nostril 1-2 times per day  B.  Albuterol HFA -2 inhalations every 4-6 hours  C.  OTC nasal saline  D.  OTC antihistamine  4.  Return to clinic in 6 months or earlier if problem  Jennifer Dean appears to have very good control of her multiorgan atopic disease on her current plan.  She has the opportunity to consolidate some of her treatment by decreasing her Advair and Qnasl to possibly 5 times per week and possibly 3 times per week if she is doing well.  Assuming she does well on the plan noted above I will see her back in this clinic in 6 months or earlier if there is a problem.  [name], MD Allergy / Immunology [facility]

## 2020-07-22 ENCOUNTER — Encounter: Payer: Self-pay | Admitting: Allergy and Immunology

## 2020-07-22 ENCOUNTER — Other Ambulatory Visit (HOSPITAL_COMMUNITY): Payer: Self-pay

## 2020-07-28 ENCOUNTER — Ambulatory Visit (INDEPENDENT_AMBULATORY_CARE_PROVIDER_SITE_OTHER): Payer: 59 | Admitting: Psychology

## 2020-07-28 DIAGNOSIS — F4310 Post-traumatic stress disorder, unspecified: Secondary | ICD-10-CM | POA: Diagnosis not present

## 2020-07-30 ENCOUNTER — Other Ambulatory Visit (HOSPITAL_COMMUNITY): Payer: Self-pay

## 2020-08-04 ENCOUNTER — Other Ambulatory Visit: Payer: Self-pay

## 2020-08-04 ENCOUNTER — Ambulatory Visit: Payer: 59 | Admitting: Family Medicine

## 2020-08-04 ENCOUNTER — Ambulatory Visit: Payer: 59 | Admitting: Physician Assistant

## 2020-08-04 ENCOUNTER — Encounter: Payer: Self-pay | Admitting: Family Medicine

## 2020-08-04 ENCOUNTER — Other Ambulatory Visit (HOSPITAL_COMMUNITY)
Admission: RE | Admit: 2020-08-04 | Discharge: 2020-08-04 | Disposition: A | Discharge status: Home / Self Care | Payer: 59 | Source: Ambulatory Visit | Attending: Physician Assistant | Admitting: Physician Assistant

## 2020-08-04 ENCOUNTER — Ambulatory Visit (INDEPENDENT_AMBULATORY_CARE_PROVIDER_SITE_OTHER): Payer: 59 | Admitting: Psychology

## 2020-08-04 VITALS — BP 132/90 | HR 74 | Temp 97.9°F | Resp 16 | Ht 63.0 in | Wt 222.4 lb

## 2020-08-04 DIAGNOSIS — Z975 Presence of (intrauterine) contraceptive device: Secondary | ICD-10-CM | POA: Diagnosis not present

## 2020-08-04 DIAGNOSIS — N939 Abnormal uterine and vaginal bleeding, unspecified: Secondary | ICD-10-CM | POA: Insufficient documentation

## 2020-08-04 DIAGNOSIS — F4310 Post-traumatic stress disorder, unspecified: Secondary | ICD-10-CM | POA: Diagnosis not present
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
## 2020-08-04 NOTE — Patient Instructions (Addendum)
Please follow up if symptoms do not improve or as needed.   I will release your lab results to you on your MyChart account with further instructions. Please reply with any questions.   Dysfunctional Uterine Bleeding Dysfunctional uterine bleeding is abnormal bleeding from the uterus. Dysfunctional uterine bleeding includes:  A menstrual period that comes earlier or later than usual.  A menstrual period that is lighter or heavier than usual, or has large blood clots.  Vaginal bleeding between menstrual periods.  Skipping one or more menstrual periods.  Vaginal bleeding after sex.  Vaginal bleeding after menopause. Follow these instructions at home: Eating and drinking  Eat well-balanced meals. Include foods that are high in iron, such as liver, meat, shellfish, green leafy vegetables, and eggs.  To prevent or treat constipation, your health care provider may recommend that you: ? Drink enough fluid to keep your urine pale yellow. ? Take over-the-counter or prescription medicines. ? Eat foods that are high in fiber, such as beans, whole grains, and fresh fruits and vegetables. ? Limit foods that are high in fat and processed sugars, such as fried or sweet foods.   Medicines  Take over-the-counter and prescription medicines only as told by your health care provider.  Do not change medicines without talking with your health care provider.  Aspirin or medicines that contain aspirin may make the bleeding worse. Do not take those medicines: ? During the week before your menstrual period. ? During your menstrual period.  If you were prescribed iron pills, take them as told by your health care provider. Iron pills help to replace iron that your body loses because of this condition. Activity  If you need to change your sanitary pad or tampon more than one time every 2 hours: ? Lie in bed with your feet raised (elevated). ? Place a cold pack on your lower abdomen. ? Rest as much as  possible until the bleeding stops or slows down.  Do not try to lose weight until the bleeding has stopped and your blood iron level is back to normal. General instructions  For two months, write down: ? When your menstrual period starts. ? When your menstrual period ends. ? When any abnormal vaginal bleeding occurs. ? What problems you notice.  Keep all follow up visits as told by your health care provider. This is important.   Contact a health care provider if you:  Feel light-headed or weak.  Have nausea and vomiting.  Cannot eat or drink without vomiting.  Feel dizzy or have diarrhea while you are taking medicines.  Are taking birth control pills or hormones, and you want to change them or stop taking them. Get help right away if:  You develop a fever or chills.  You need to change your sanitary pad or tampon more than one time per hour.  Your vaginal bleeding becomes heavier, or your flow contains clots more often.  You develop pain in your abdomen.  You lose consciousness.  You develop a rash. Summary  Dysfunctional uterine bleeding is abnormal bleeding from the uterus.  It includes menstrual bleeding of abnormal duration, volume, or regularity.  Bleeding after sex and after menopause are also considered dysfunctional uterine bleeding. This information is not intended to replace advice given to you by your health care provider. Make sure you discuss any questions you have with your health care provider. Document Revised: 08/30/2017 Document Reviewed: 08/30/2017 Elsevier Patient Education  2021 [employer].

## 2020-08-05 LAB — CERVICOVAGINAL ANCILLARY ONLY
Bacterial Vaginitis (gardnerella): NEGATIVE
Candida Glabrata: NEGATIVE
Candida Vaginitis: NEGATIVE
Chlamydia: NEGATIVE
Comment: NEGATIVE
Comment: NEGATIVE
Comment: NEGATIVE
Comment: NEGATIVE
Comment: NEGATIVE
Comment: NORMAL
Neisseria Gonorrhea: NEGATIVE
Trichomonas: NEGATIVE

## 2020-08-10 ENCOUNTER — Other Ambulatory Visit (INDEPENDENT_AMBULATORY_CARE_PROVIDER_SITE_OTHER): Payer: 59

## 2020-08-10 ENCOUNTER — Other Ambulatory Visit: Payer: Self-pay

## 2020-08-10 DIAGNOSIS — N939 Abnormal uterine and vaginal bleeding, unspecified: Secondary | ICD-10-CM | POA: Diagnosis not present

## 2020-08-10 LAB — IRON,TIBC AND FERRITIN PANEL
%SAT: 32 % (calc) (ref 16–45)
Ferritin: 25 ng/mL (ref 16–154)
Iron: 130 ug/dL (ref 40–190)
TIBC: 404 mcg/dL (calc) (ref 250–450)

## 2020-08-10 LAB — CBC WITH DIFFERENTIAL/PLATELET
Basophils Absolute: 0.1 10*3/uL (ref 0.0–0.1)
Basophils Relative: 1.1 % (ref 0.0–3.0)
Eosinophils Absolute: 0.2 10*3/uL (ref 0.0–0.7)
Eosinophils Relative: 3.2 % (ref 0.0–5.0)
HCT: 39.3 % (ref 36.0–46.0)
Hemoglobin: 13.4 g/dL (ref 12.0–15.0)
Lymphocytes Relative: 37 % (ref 12.0–46.0)
Lymphs Abs: 1.8 10*3/uL (ref 0.7–4.0)
MCHC: 34 g/dL (ref 30.0–36.0)
MCV: 94.1 fl (ref 78.0–100.0)
Monocytes Absolute: 0.4 10*3/uL (ref 0.1–1.0)
Monocytes Relative: 7.5 % (ref 3.0–12.0)
Neutro Abs: 2.5 10*3/uL (ref 1.4–7.7)
Neutrophils Relative %: 51.2 % (ref 43.0–77.0)
Platelets: 306 10*3/uL (ref 150.0–400.0)
RBC: 4.18 Mil/uL (ref 3.87–5.11)
RDW: 12.6 % (ref 11.5–15.5)
WBC: 4.8 10*3/uL (ref 4.0–10.5)

## 2020-08-10 LAB — TSH: TSH: 0.89 u[IU]/mL (ref 0.35–4.50)

## 2020-08-11 ENCOUNTER — Ambulatory Visit (INDEPENDENT_AMBULATORY_CARE_PROVIDER_SITE_OTHER): Payer: 59 | Admitting: Psychology

## 2020-08-11 ENCOUNTER — Encounter: Payer: Self-pay | Admitting: Family Medicine

## 2020-08-11 DIAGNOSIS — F4310 Post-traumatic stress disorder, unspecified: Secondary | ICD-10-CM | POA: Diagnosis not present

## 2020-08-12 ENCOUNTER — Other Ambulatory Visit (HOSPITAL_COMMUNITY): Payer: Self-pay

## 2020-08-13 ENCOUNTER — Other Ambulatory Visit (HOSPITAL_COMMUNITY): Payer: Self-pay

## 2020-08-18 ENCOUNTER — Ambulatory Visit: Payer: 59 | Admitting: Psychology

## 2020-08-25 ENCOUNTER — Ambulatory Visit (INDEPENDENT_AMBULATORY_CARE_PROVIDER_SITE_OTHER): Payer: 59 | Admitting: Psychology

## 2020-08-25 DIAGNOSIS — F4310 Post-traumatic stress disorder, unspecified: Secondary | ICD-10-CM | POA: Diagnosis not present

## 2020-09-01 ENCOUNTER — Ambulatory Visit: Payer: 59 | Admitting: Psychology

## 2020-09-08 ENCOUNTER — Ambulatory Visit (INDEPENDENT_AMBULATORY_CARE_PROVIDER_SITE_OTHER): Payer: 59 | Admitting: Psychology

## 2020-09-08 DIAGNOSIS — F4310 Post-traumatic stress disorder, unspecified: Secondary | ICD-10-CM

## 2020-09-22 ENCOUNTER — Ambulatory Visit (INDEPENDENT_AMBULATORY_CARE_PROVIDER_SITE_OTHER): Payer: 59 | Admitting: Psychology

## 2020-09-22 DIAGNOSIS — F4310 Post-traumatic stress disorder, unspecified: Secondary | ICD-10-CM | POA: Diagnosis not present

## 2020-09-29 ENCOUNTER — Ambulatory Visit (INDEPENDENT_AMBULATORY_CARE_PROVIDER_SITE_OTHER): Payer: 59 | Admitting: Psychology

## 2020-09-29 DIAGNOSIS — F4310 Post-traumatic stress disorder, unspecified: Secondary | ICD-10-CM | POA: Diagnosis not present

## 2020-10-05 MED FILL — Omeprazole Cap Delayed Release 20 MG: ORAL | 90 days supply | Qty: 90 | Fill #0 | Status: AC

## 2020-10-05 MED FILL — Escitalopram Oxalate Tab 20 MG (Base Equiv): ORAL | 90 days supply | Qty: 90 | Fill #0 | Status: AC

## 2020-10-06 ENCOUNTER — Ambulatory Visit (INDEPENDENT_AMBULATORY_CARE_PROVIDER_SITE_OTHER): Payer: 59 | Admitting: Psychology

## 2020-10-06 ENCOUNTER — Other Ambulatory Visit (HOSPITAL_COMMUNITY): Payer: Self-pay

## 2020-10-06 DIAGNOSIS — F4310 Post-traumatic stress disorder, unspecified: Secondary | ICD-10-CM

## 2020-10-07 ENCOUNTER — Ambulatory Visit: Payer: 59 | Admitting: Family Medicine

## 2020-10-13 ENCOUNTER — Ambulatory Visit (INDEPENDENT_AMBULATORY_CARE_PROVIDER_SITE_OTHER): Payer: 59 | Admitting: Psychology

## 2020-10-13 DIAGNOSIS — F4310 Post-traumatic stress disorder, unspecified: Secondary | ICD-10-CM | POA: Diagnosis not present

## 2020-10-16 ENCOUNTER — Ambulatory Visit: Payer: 59 | Admitting: Family Medicine

## 2020-10-16 ENCOUNTER — Other Ambulatory Visit (HOSPITAL_COMMUNITY): Payer: Self-pay

## 2020-10-16 ENCOUNTER — Encounter: Payer: Self-pay | Admitting: Family Medicine

## 2020-10-16 ENCOUNTER — Other Ambulatory Visit: Payer: Self-pay

## 2020-10-16 VITALS — BP 120/82 | HR 79 | Temp 98.2°F | Ht 63.0 in | Wt 224.2 lb

## 2020-10-16 DIAGNOSIS — Z62810 Personal history of physical and sexual abuse in childhood: Secondary | ICD-10-CM | POA: Diagnosis not present

## 2020-10-16 DIAGNOSIS — F445 Conversion disorder with seizures or convulsions: Secondary | ICD-10-CM

## 2020-10-16 DIAGNOSIS — F329 Major depressive disorder, single episode, unspecified: Secondary | ICD-10-CM | POA: Diagnosis not present

## 2020-10-16 MED ORDER — LAMOTRIGINE 42 X 25 MG & 7 X 100 MG PO KIT
PACK | ORAL | 0 refills | Status: DC
  Filled 2020-10-16: qty 1, 30d supply, fill #0

## 2020-10-16 MED ORDER — LAMOTRIGINE 100 MG PO TABS
100.0000 mg | ORAL_TABLET | Freq: Every day | ORAL | 5 refills | Status: DC
  Filled 2020-11-16: qty 30, 30d supply, fill #0
  Filled 2020-12-16: qty 30, 30d supply, fill #1
  Filled 2021-01-29: qty 30, 30d supply, fill #2
  Filled 2021-03-18: qty 30, 30d supply, fill #3
  Filled 2021-04-17: qty 30, 30d supply, fill #4
  Filled 2021-06-03: qty 30, 30d supply, fill #5

## 2020-10-16 MED ORDER — LAMOTRIGINE 42 X 25 MG & 7 X 100 MG PO KIT
PACK | ORAL | 0 refills | Status: DC
  Filled 2020-10-16: qty 49, 28d supply, fill #0

## 2020-10-16 MED ORDER — LAMOTRIGINE 25 MG PO TABS
ORAL_TABLET | ORAL | 0 refills | Status: DC
  Filled 2020-10-16: qty 42, 28d supply, fill #0

## 2020-10-16 NOTE — Patient Instructions (Signed)
Please return in 3 months for recheck.   I've ordered lamictal; a starter kit will be dispensed. Follow the instructions. Once complete, take the printed RX to pharmacy for the 171m daily dosing.  Continue the lexapro.  Continue therapy.   If you have any questions or concerns, please don't hesitate to send me a message via MyChart or call the office at [phone] Thank you for visiting with [facility]! It's our pleasure caring for you.
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
## 2020-10-16 NOTE — Addendum Note (Signed)
Addended by: Billey Chang on: 10/16/2020 12:08 PM   Modules accepted: Orders

## 2020-10-20 ENCOUNTER — Ambulatory Visit (INDEPENDENT_AMBULATORY_CARE_PROVIDER_SITE_OTHER): Payer: 59 | Admitting: Psychology

## 2020-10-20 DIAGNOSIS — F4310 Post-traumatic stress disorder, unspecified: Secondary | ICD-10-CM | POA: Diagnosis not present

## 2020-10-22 ENCOUNTER — Other Ambulatory Visit (HOSPITAL_COMMUNITY): Payer: Self-pay

## 2020-10-22 ENCOUNTER — Other Ambulatory Visit: Payer: Self-pay

## 2020-10-22 ENCOUNTER — Ambulatory Visit: Payer: 59 | Admitting: Family

## 2020-10-22 ENCOUNTER — Encounter: Payer: Self-pay | Admitting: Family

## 2020-10-22 ENCOUNTER — Telehealth: Payer: Self-pay

## 2020-10-22 VITALS — BP 128/80 | HR 88 | Temp 98.2°F | Ht 63.0 in | Wt 224.2 lb

## 2020-10-22 DIAGNOSIS — J302 Other seasonal allergic rhinitis: Secondary | ICD-10-CM

## 2020-10-22 DIAGNOSIS — H9201 Otalgia, right ear: Secondary | ICD-10-CM

## 2020-10-22 DIAGNOSIS — H6121 Impacted cerumen, right ear: Secondary | ICD-10-CM | POA: Diagnosis not present

## 2020-10-22 DIAGNOSIS — H6591 Unspecified nonsuppurative otitis media, right ear: Secondary | ICD-10-CM

## 2020-10-22 MED ORDER — LEVOCETIRIZINE DIHYDROCHLORIDE 5 MG PO TABS
5.0000 mg | ORAL_TABLET | Freq: Every evening | ORAL | 2 refills | Status: DC
  Filled 2020-10-22: qty 30, 30d supply, fill #0
  Filled 2020-11-18: qty 30, 30d supply, fill #1
  Filled 2020-12-24: qty 30, 30d supply, fill #2

## 2020-10-22 MED ORDER — AMOXICILLIN-POT CLAVULANATE 875-125 MG PO TABS
1.0000 | ORAL_TABLET | Freq: Two times a day (BID) | ORAL | 0 refills | Status: DC
Start: 2020-10-22 — End: 2021-01-20
  Filled 2020-10-22: qty 20, 10d supply, fill #0

## 2020-10-22 NOTE — Telephone Encounter (Signed)
Patient Name: Jennifer Dean [facility] Gender: Female DOB: July 02, 1980 Age: 40 Y 64 M 29 D Return Phone Number: [phone] (Primary) Address: City/ State/ Zip: [place]  [address] Client [place] [facility] [place] Client Site [place] [facility] [place] Night Physician Billey Chang- MD Contact Type Call Who Is Calling Patient / Member / Family / Caregiver Call Type Triage / Clinical Relationship To Patient Self Return Phone Number [phone] (Primary) Chief Complaint Earache Reason for Call Symptomatic / Request for [place] is having bad ear pain and she isn't sure if there's anything she can do. Translation No Nurse Assessment Nurse: Lynnette Caffey, RN, Museum/gallery conservator Date/Time (Eastern Time): 10/22/2020 12:39:13 AM Confirm and document reason for call. If symptomatic, describe symptoms. ---Caller states is having bad right ear pain and she isn't sure if there's anything she can do. States thinks it's from allergies. States only way to relieve pain is to hold ear to hand. Does the patient have any new or worsening symptoms? ---Yes Will a triage be completed? ---Yes Related visit to physician within the last 2 weeks? ---No Does the PT have any chronic conditions? (i.e. diabetes, asthma, this includes High risk factors for pregnancy, etc.) ---Yes List chronic conditions. ---asthma, allergies Is the patient pregnant or possibly pregnant? (Ask all females between the ages of 28-55) ---No Is this a behavioral health or substance abuse call? ---No Guidelines Guideline Title Affirmed Question Affirmed Notes Nurse Date/Time (Eastern Time) Earache Earache (Exceptions: brief ear pain of < 60 minutes duration, earache occurring during air travel [place], Therapist, sports, [employer] 10/22/2020 12:41:21 AM PLEASE NOTE: All timestamps contained within this report are represented as [place] Standard Time. CONFIDENTIALTY NOTICE: This fax  transmission is intended only for the addressee. It contains information that is legally privileged, confidential or otherwise protected from use or disclosure. If you are not the intended recipient, you are strictly prohibited from reviewing, disclosing, copying using or disseminating any of this information or taking any action in reliance on or regarding this information. If you have received this fax in error, please notify us immediately by telephone so that we can arrange for its return to [place]. Phone: [phone], Toll-Free: [phone], Fax: [phone] Page: 2 of 2 Call Id: [id number] [place]. Time Eilene Ghazi Time) Disposition Final User 10/22/2020 12:44:22 AM See PCP within 24 Hours Yes Morris, RN, Management consultant Understands Yes PreDisposition Did not know what to do Care Advice Given Per Guideline SEE PCP WITHIN 24 HOURS: * IF OFFICE WILL BE OPEN: You need to be examined within the next 24 hours. Call your doctor (or NP/PA) when the office opens and make an appointment. PAIN MEDICINES: * For pain relief, you can take either acetaminophen, ibuprofen, or naproxen. * IBUPROFEN (E.G., MOTRIN, ADVIL): Take 400 mg (two 200 mg pills) by mouth every 6 hours. The most you should take each day is 1,200 mg (six 200 mg pills), unless your doctor has told you to take more. CALL BACK IF: * Severe pain persists over 2 hours after pain medicine * You become worse CARE ADVICE given per Earache (Adult) guideline. COLD PACK FOR EAR PAIN: Referrals REFERRED TO PCP OFFICE

## 2020-10-22 NOTE — Telephone Encounter (Signed)
Noted, pt scheduled today with [facility].

## 2020-10-22 NOTE — Progress Notes (Signed)
Acute Office Visit  Subjective:    Patient ID: Jennifer Dean, female    DOB: 12-03-1980, 40 y.o.   MRN: [id number]  Chief Complaint  Patient presents with   Ear Pain    Right; started yesterday. She states that she has an allergy to dogs, and has been helping with her neighbors dog. She tried popping her ear by holding nose and blowing. She says that afterwards her ear began to throb. She denies headache, dizziness, and fever.     HPI Patient is in today with c/o right ear ear pain and popping.  Pain is described as throbbing.  She has been taking ibuprofen and Zyrtec that has not helped.  Reports being around dogs and she has an allergy to dogs she feels like this may have contributed to her symptoms.  She denies any fever, headache or chills.  Symptoms are worsening.  She also feels that Zyrtec does not help sometimes.  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   GYN manages; has had PT   History of chickenpox    Obesity (BMI 30-39.9) 07/26/2012   Overview:  Nl sleep study 2014    Vision abnormalities     Past Surgical History:  Procedure Laterality Date   WISDOM TOOTH EXTRACTION      Family History  Problem Relation Age of Onset   Hypertension Mother    GER disease Mother    Eczema Mother    Hypertension Father    Post-traumatic stress disorder Father    Allergic rhinitis Neg Hx    Angioedema Neg Hx    Asthma Neg Hx    Atopy Neg Hx    Immunodeficiency Neg Hx    Urticaria Neg Hx     Social History   Socioeconomic History   Marital status: Divorced    Spouse name: Not on file   Number of children: 2   Years of education: Not on file   Highest education level: Not on file  Occupational History   Occupation: Herbalist: [employer]  Tobacco Use   Smoking status: Never   Smokeless tobacco: Never  Vaping Use   Vaping Use: Never used  Substance and Sexual  Activity   Alcohol use: No    Comment: ocassionally   Drug use: No   Sexual activity: Yes    Birth control/protection: Pill  Other Topics Concern   Not on file  Social History Narrative   Not on file   Social Determinants of Health   Financial Resource Strain: Not on file  Food Insecurity: Not on file  Transportation Needs: Not on file  Physical Activity: Not on file  Stress: Not on file  Social Connections: Not on file  Intimate Partner Violence: Not on file    Outpatient Medications Prior to Visit  Medication Sig Dispense Refill   albuterol (VENTOLIN HFA) 108 (90 Base) MCG/ACT inhaler INHALE 2 PUFFS INTO THE LUNGS EVERY 4 (FOUR) HOURS AS NEEDED FOR WHEEZING OR SHORTNESS OF BREATH. 18 g 5   azelastine (ASTELIN) 0.1 % nasal spray PLACE 1 SPRAY INTO BOTH NOSTRILS 2 TIMES DAILY. 30 mL 5   Beclomethasone Dipropionate 80 MCG/ACT AERS PLACE 1 SPRAY INTO THE NOSE 2 TIMES DAILY. 10.6 g 3   escitalopram (LEXAPRO) 20 MG tablet TAKE 1 TABLET (20 MG TOTAL) BY MOUTH DAILY. 90 tablet 3  Fluticasone-Salmeterol (ADVAIR) 250-50 MCG/DOSE AEPB Inhale 1 puff into the lungs 2 (two) times daily. 60 each 5   lamoTRIgine (LAMICTAL) 100 MG tablet Take 1 tablet (100 mg total) by mouth daily. 30 tablet 5   lamoTRIgine (LAMICTAL) 25 MG tablet Take 1 tablet (25 mg total) by mouth daily for 14 days, THEN 2 tablets (50 mg total) daily for 14 days. 42 tablet 0   omeprazole (PRILOSEC) 20 MG capsule TAKE 1 CAPSULE (20 MG TOTAL) BY MOUTH DAILY. 90 capsule 3   No facility-administered medications prior to visit.    Allergies  Allergen Reactions   Morphine And Related Nausea And Vomiting   Codeine Anxiety    Dry mouth, uneasy feeling    Review of Systems  Constitutional: Negative.   HENT:  Positive for congestion, ear pain, postnasal drip, rhinorrhea and sinus pain.   Respiratory: Negative.    Cardiovascular: Negative.   Musculoskeletal: Negative.   Skin: Negative.   Allergic/Immunologic: Negative.    Neurological: Negative.   Hematological: Negative.   Psychiatric/Behavioral: Negative.    All other systems reviewed and are negative.     Objective:    Physical Exam Vitals and nursing note reviewed.  Constitutional:      Appearance: Normal appearance.  HENT:     Head: Normocephalic and atraumatic.     Right Ear: There is impacted cerumen.     Left Ear: Tympanic membrane and ear canal normal.     Ears:     Comments: Right ear lavage: Informed consent was obtained and peroxide gel was inserted into the ears bilaterally using the lavage kit the ears were lavaged until clean.Inspection with a cerumen spoon removed residual wax. Patient tolerated the procedure well. Red, bulging tympanic membrane after lavage was performed. Cardiovascular:     Rate and Rhythm: Normal rate and regular rhythm.  Pulmonary:     Effort: Pulmonary effort is normal.     Breath sounds: Normal breath sounds.  Musculoskeletal:     Cervical back: Normal range of motion and neck supple.  Skin:    General: Skin is warm and dry.  Neurological:     General: No focal deficit present.     Mental Status: She is alert and oriented to person, place, and time.  Psychiatric:        Mood and Affect: Mood normal.        Behavior: Behavior normal.    BP 128/80   Pulse 88   Temp 98.2 F (36.8 C) (Temporal)   Ht '5\' 3"'  (1.6 m)   Wt 224 lb 3.2 oz (101.7 kg)   LMP 10/06/2020   SpO2 100%   BMI 39.72 kg/m  Wt Readings from Last 3 Encounters:  10/22/20 224 lb 3.2 oz (101.7 kg)  10/16/20 224 lb 4 oz (101.7 kg)  08/04/20 222 lb 6.4 oz (100.9 kg)    There are no preventive care reminders to display for this patient.  There are no preventive care reminders to display for this patient.   Lab Results  Component Value Date   TSH 0.89 08/10/2020   Lab Results  Component Value Date   WBC 4.8 08/10/2020   HGB 13.4 08/10/2020   HCT 39.3 08/10/2020   MCV 94.1 08/10/2020   PLT 306.0 08/10/2020   Lab Results   Component Value Date   NA 136 04/07/2020   K 3.7 04/07/2020   CO2 27 04/07/2020   GLUCOSE 80 04/07/2020   BUN 11 04/07/2020   CREATININE 0.77  04/07/2020   BILITOT 0.5 04/07/2020   ALKPHOS 61 04/07/2020   AST 20 04/07/2020   ALT 11 04/07/2020   PROT 7.1 04/07/2020   ALBUMIN 4.1 04/07/2020   CALCIUM 8.6 04/07/2020   ANIONGAP 6 07/15/2016   GFR 97.43 04/07/2020   Lab Results  Component Value Date   CHOL 137 04/07/2020   Lab Results  Component Value Date   HDL 53.30 04/07/2020   Lab Results  Component Value Date   LDLCALC 74 04/07/2020   Lab Results  Component Value Date   TRIG 47.0 04/07/2020   Lab Results  Component Value Date   CHOLHDL 3 04/07/2020   No results found for: HGBA1C     Assessment & Plan:   Problem List Items Addressed This Visit   None Visit Diagnoses     Impacted cerumen of right ear    -  Primary   Right ear pain       Right non-suppurative otitis media       Relevant Medications   amoxicillin-clavulanate (AUGMENTIN) 875-125 MG tablet   Seasonal allergies            Meds ordered this encounter  Medications   amoxicillin-clavulanate (AUGMENTIN) 875-125 MG tablet    Sig: Take 1 tablet by mouth 2 (two) times daily.    Dispense:  20 tablet    Refill:  0   levocetirizine (XYZAL ALLERGY 24HR) 5 MG tablet    Sig: Take 1 tablet (5 mg total) by mouth every evening.    Dispense:  30 tablet    Refill:  2   Plan: DC Zyrtec start Xyzal.  For otitis media we will treat with Augmentin twice a day x10 days.  Rest.  Drink plenty fluids.  Call the office if symptoms worsen or persist.  Kennyth Arnold, FNP

## 2020-10-26 ENCOUNTER — Other Ambulatory Visit (HOSPITAL_COMMUNITY): Payer: Self-pay

## 2020-10-26 ENCOUNTER — Other Ambulatory Visit: Payer: Self-pay | Admitting: Allergy and Immunology

## 2020-10-26 MED ORDER — FLUTICASONE-SALMETEROL 250-50 MCG/ACT IN AEPB
1.0000 | INHALATION_SPRAY | Freq: Two times a day (BID) | RESPIRATORY_TRACT | 5 refills | Status: DC
  Filled 2020-10-26: qty 60, 30d supply, fill #0
  Filled 2021-02-15: qty 60, 30d supply, fill #1
  Filled 2021-06-14: qty 60, 30d supply, fill #2

## 2020-10-27 ENCOUNTER — Ambulatory Visit (INDEPENDENT_AMBULATORY_CARE_PROVIDER_SITE_OTHER): Payer: 59 | Admitting: Psychology

## 2020-10-27 DIAGNOSIS — F4310 Post-traumatic stress disorder, unspecified: Secondary | ICD-10-CM | POA: Diagnosis not present

## 2020-11-03 ENCOUNTER — Ambulatory Visit (INDEPENDENT_AMBULATORY_CARE_PROVIDER_SITE_OTHER): Payer: 59 | Admitting: Psychology

## 2020-11-03 DIAGNOSIS — F4310 Post-traumatic stress disorder, unspecified: Secondary | ICD-10-CM | POA: Diagnosis not present

## 2020-11-11 ENCOUNTER — Encounter: Payer: Self-pay | Admitting: Family Medicine

## 2020-11-13 ENCOUNTER — Other Ambulatory Visit (HOSPITAL_COMMUNITY): Payer: Self-pay

## 2020-11-13 ENCOUNTER — Other Ambulatory Visit: Payer: Self-pay | Admitting: Family Medicine

## 2020-11-16 ENCOUNTER — Other Ambulatory Visit (HOSPITAL_COMMUNITY): Payer: Self-pay

## 2020-11-17 ENCOUNTER — Ambulatory Visit (INDEPENDENT_AMBULATORY_CARE_PROVIDER_SITE_OTHER): Payer: 59 | Admitting: Psychology

## 2020-11-17 DIAGNOSIS — F4310 Post-traumatic stress disorder, unspecified: Secondary | ICD-10-CM | POA: Diagnosis not present

## 2020-11-18 ENCOUNTER — Other Ambulatory Visit (HOSPITAL_COMMUNITY): Payer: Self-pay

## 2020-11-24 ENCOUNTER — Ambulatory Visit (INDEPENDENT_AMBULATORY_CARE_PROVIDER_SITE_OTHER): Payer: 59 | Admitting: Psychology

## 2020-11-24 DIAGNOSIS — F4310 Post-traumatic stress disorder, unspecified: Secondary | ICD-10-CM | POA: Diagnosis not present

## 2020-12-01 ENCOUNTER — Ambulatory Visit: Payer: 59 | Admitting: Psychology

## 2020-12-16 ENCOUNTER — Other Ambulatory Visit (HOSPITAL_COMMUNITY): Payer: Self-pay

## 2020-12-24 ENCOUNTER — Other Ambulatory Visit (HOSPITAL_COMMUNITY): Payer: Self-pay

## 2021-01-13 MED FILL — Omeprazole Cap Delayed Release 20 MG: ORAL | 90 days supply | Qty: 90 | Fill #1 | Status: AC

## 2021-01-13 MED FILL — Escitalopram Oxalate Tab 20 MG (Base Equiv): ORAL | 90 days supply | Qty: 90 | Fill #1 | Status: AC

## 2021-01-14 ENCOUNTER — Other Ambulatory Visit (HOSPITAL_COMMUNITY): Payer: Self-pay

## 2021-01-20 ENCOUNTER — Other Ambulatory Visit: Payer: Self-pay

## 2021-01-20 ENCOUNTER — Encounter: Payer: Self-pay | Admitting: Family Medicine

## 2021-01-20 ENCOUNTER — Ambulatory Visit: Payer: 59 | Admitting: Family Medicine

## 2021-01-20 VITALS — BP 120/88 | HR 73 | Temp 98.4°F | Ht 63.0 in | Wt 221.6 lb

## 2021-01-20 DIAGNOSIS — F445 Conversion disorder with seizures or convulsions: Secondary | ICD-10-CM

## 2021-01-20 DIAGNOSIS — R6889 Other general symptoms and signs: Secondary | ICD-10-CM | POA: Diagnosis not present

## 2021-01-20 DIAGNOSIS — F329 Major depressive disorder, single episode, unspecified: Secondary | ICD-10-CM | POA: Diagnosis not present

## 2021-01-20 MED ORDER — LAMOTRIGINE 100 MG PO TABS: 100.0000 mg | ORAL_TABLET | Freq: Every day | ORAL | 5 refills | Status: DC
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
## 2021-01-20 NOTE — Patient Instructions (Signed)
Please return in January for your annual complete physical; please come fasting.   Please restart therapy.  We can consider changing to a different antidepressant or getting you to a psychiatrist if your depressive symptoms do not improve with counseling.   If you have any questions or concerns, please don't hesitate to send me a message via MyChart or call the office at [phone]. Thank you for visiting with [place] today! It's our pleasure caring for you.

## 2021-01-22 ENCOUNTER — Other Ambulatory Visit (HOSPITAL_COMMUNITY): Payer: Self-pay

## 2021-01-22 ENCOUNTER — Encounter: Payer: Self-pay | Admitting: Physician Assistant

## 2021-01-22 ENCOUNTER — Telehealth (INDEPENDENT_AMBULATORY_CARE_PROVIDER_SITE_OTHER): Payer: 59 | Admitting: Physician Assistant

## 2021-01-22 DIAGNOSIS — U071 COVID-19: Secondary | ICD-10-CM

## 2021-01-22 MED ORDER — MOLNUPIRAVIR EUA 200MG CAPSULE
4.0000 | ORAL_CAPSULE | Freq: Two times a day (BID) | ORAL | 0 refills | Status: AC
  Filled 2021-01-22: qty 40, 5d supply, fill #0

## 2021-01-22 NOTE — Patient Instructions (Signed)
Please take the Molnupiravir as directed.  Continue to keep [place] updated on how you are doing.  Low threshold for emergency department if any acutely worsening symptoms.

## 2021-01-22 NOTE — Progress Notes (Signed)
Virtual Visit via Video Note  I connected with  Jennifer Dean  on 01/22/21 at 11:45 AM EDT by a video enabled telemedicine application and verified that I am speaking with the correct person using two identifiers.  Location: Patient: home Provider: Therapist, music at [place] present: Patient and myself   I discussed the limitations of evaluation and management by telemedicine and the availability of in person appointments. The patient expressed understanding and agreed to proceed.   History of Present Illness: Chief complaint: COVID+ at home test on 01/21/21 Symptom onset: 01/21/21 Pertinent positives: Body aches, cough, chest tightness, nausea Pertinent negatives: Fever, V/D, chest pain, shortness of breath Treatments tried: Ibuprofen, Zinc, Vit C & Vit D  Vaccine status: Pfizer x 2  Sick exposure: No known sick contacts   +history of asthma and allergies  LMP 01/07/21, no chance of pregnancy per patient   Observations/Objective:   Gen: Awake, alert, no acute distress; malaise appearing  Resp: Breathing is even and non-labored Psych: calm/pleasant demeanor Neuro: Alert and Oriented x 3, + facial symmetry, speech is clear.   Assessment and Plan:  1. COVID-19 Diagnosis confirmed via home antigen test. We discussed current algorithm recommendations for prescribing outpatient antivirals.  Patient has a history of asthma and her COVID vaccines are not up-to-date.  We discussed risks versus benefits of antiviral therapy.  Last menstrual period then 2 weeks ago and no chance of pregnancy, therefore Molnupiravir would be okay in her situation.  Possible side effects discussed with patient.  Advised self-isolation at home for the next 5 days and then masking around others for at least an additional 5 days.  Treat supportively at this time including sleeping prone, deep breathing exercises, pushing fluids, walking every few hours, vitamins C and D, and Tylenol  or ibuprofen as needed.  The patient understands that COVID-19 illness can wax and wane.  Should the symptoms acutely worsen or patient starts to experience sudden shortness of breath, chest pain, severe weakness, the patient will go straight to the emergency department.  Also advised home pulse oximetry monitoring and for any reading consistently under 92%, should also report to the emergency department.  The patient will continue to keep [place] updated.   Follow Up Instructions:    I discussed the assessment and treatment plan with the patient. The patient was provided an opportunity to ask questions and all were answered. The patient agreed with the plan and demonstrated an understanding of the instructions.   The patient was advised to call back or seek an in-person evaluation if the symptoms worsen or if the condition fails to improve as anticipated.  Mabry Santarelli M Aymar Whitfill, PA-C

## 2021-01-26 ENCOUNTER — Ambulatory Visit: Payer: 59 | Admitting: Allergy and Immunology

## 2021-01-29 ENCOUNTER — Other Ambulatory Visit (HOSPITAL_COMMUNITY): Payer: Self-pay

## 2021-02-01 ENCOUNTER — Other Ambulatory Visit (HOSPITAL_COMMUNITY): Payer: Self-pay

## 2021-02-01 DIAGNOSIS — Z23 Encounter for immunization: Secondary | ICD-10-CM | POA: Diagnosis not present

## 2021-02-03 ENCOUNTER — Encounter (HOSPITAL_COMMUNITY): Payer: Self-pay

## 2021-02-03 ENCOUNTER — Emergency Department (HOSPITAL_COMMUNITY)
Admission: EM | Admit: 2021-02-03 | Discharge: 2021-02-03 | Discharge status: Against Medical Advice | Payer: 59 | Attending: Emergency Medicine | Admitting: Emergency Medicine

## 2021-02-03 ENCOUNTER — Other Ambulatory Visit: Payer: Self-pay

## 2021-02-03 ENCOUNTER — Telehealth: Payer: Self-pay

## 2021-02-03 DIAGNOSIS — R110 Nausea: Secondary | ICD-10-CM | POA: Insufficient documentation

## 2021-02-03 DIAGNOSIS — R531 Weakness: Secondary | ICD-10-CM | POA: Insufficient documentation

## 2021-02-03 DIAGNOSIS — R1084 Generalized abdominal pain: Secondary | ICD-10-CM | POA: Diagnosis not present

## 2021-02-03 DIAGNOSIS — R197 Diarrhea, unspecified: Secondary | ICD-10-CM | POA: Diagnosis not present

## 2021-02-03 DIAGNOSIS — R109 Unspecified abdominal pain: Secondary | ICD-10-CM | POA: Insufficient documentation

## 2021-02-03 DIAGNOSIS — Z5321 Procedure and treatment not carried out due to patient leaving prior to being seen by health care provider: Secondary | ICD-10-CM | POA: Insufficient documentation

## 2021-02-03 DIAGNOSIS — R9431 Abnormal electrocardiogram [ECG] [EKG]: Secondary | ICD-10-CM | POA: Diagnosis not present

## 2021-02-03 LAB — CBC WITH DIFFERENTIAL/PLATELET
Abs Immature Granulocytes: 0.05 10*3/uL (ref 0.00–0.07)
Basophils Absolute: 0 10*3/uL (ref 0.0–0.1)
Basophils Relative: 0 %
Eosinophils Absolute: 0 10*3/uL (ref 0.0–0.5)
Eosinophils Relative: 0 %
HCT: 42.1 % (ref 36.0–46.0)
Hemoglobin: 13.8 g/dL (ref 12.0–15.0)
Immature Granulocytes: 1 %
Lymphocytes Relative: 11 %
Lymphs Abs: 0.8 10*3/uL (ref 0.7–4.0)
MCH: 31.2 pg (ref 26.0–34.0)
MCHC: 32.8 g/dL (ref 30.0–36.0)
MCV: 95.2 fL (ref 80.0–100.0)
Monocytes Absolute: 0.5 10*3/uL (ref 0.1–1.0)
Monocytes Relative: 7 %
Neutro Abs: 6.3 10*3/uL (ref 1.7–7.7)
Neutrophils Relative %: 81 %
Platelets: 329 10*3/uL (ref 150–400)
RBC: 4.42 MIL/uL (ref 3.87–5.11)
RDW: 12.1 % (ref 11.5–15.5)
WBC: 7.7 10*3/uL (ref 4.0–10.5)
nRBC: 0 % (ref 0.0–0.2)

## 2021-02-03 LAB — COMPREHENSIVE METABOLIC PANEL
ALT: 10 U/L (ref 0–44)
AST: 24 U/L (ref 15–41)
Albumin: 3.8 g/dL (ref 3.5–5.0)
Alkaline Phosphatase: 67 U/L (ref 38–126)
Anion gap: 6 (ref 5–15)
BUN: 10 mg/dL (ref 6–20)
CO2: 21 mmol/L — ABNORMAL LOW (ref 22–32)
Calcium: 8.2 mg/dL — ABNORMAL LOW (ref 8.9–10.3)
Chloride: 108 mmol/L (ref 98–111)
Creatinine, Ser: 0.71 mg/dL (ref 0.44–1.00)
GFR, Estimated: >60 mL/min (ref >60)
Glucose, Bld: 90 mg/dL (ref 70–99)
Potassium: 4.1 mmol/L (ref 3.5–5.1)
Sodium: 135 mmol/L (ref 135–145)
Total Bilirubin: 0.8 mg/dL (ref 0.3–1.2)
Total Protein: 7.3 g/dL (ref 6.5–8.1)

## 2021-02-03 LAB — LIPASE, BLOOD: Lipase: 30 U/L (ref 11–51)

## 2021-02-03 NOTE — ED Provider Notes (Signed)
Emergency Medicine Provider Triage Evaluation Note  Jennifer Dean , a 40 y.o. female  was evaluated in triage.  Pt complains of nausea, severe diarrhea, and near syncope symptoms started today.  Patient's son has been sick with a "GI bug." She denies any fever, chills, vomiting, and urinary symptoms.   Review of Systems  Positive:  Negative: See above   Physical Exam  BP 123/82 (BP Location: Right Arm)   Pulse 85   Temp 98.6 F (37 C) (Oral)   Resp 16   LMP 01/11/2021 (Approximate) Comment: States her cycles last almost 2 weeks  SpO2 100%  Gen:   Awake, no distress   Resp:  Normal effort  MSK:   Moves extremities without difficulty  Other:    Medical Decision Making  Medically screening exam initiated at 3:44 PM.  Appropriate orders placed.  Jennifer Dean was informed that the remainder of the evaluation will be completed by another provider, this initial triage assessment does not replace that evaluation, and the importance of remaining in the ED until their evaluation is complete.     Jennifer Bright [place], PA-C 02/03/21 1549    Carmin Muskrat, MD 02/03/21 [phone]

## 2021-02-03 NOTE — Telephone Encounter (Signed)
Patient Name: Jennifer Dean Gender: Female DOB: 05/26/1980 Age: 40 Y 61 M 11 D Return Phone Number: [phone] (Primary) Address: [address]  City/ State/ Zip: [place]  [address] Client [place] [facility] [place] [facility] [place] [facility] Billey Chang- MD Contact Type Call Who Is Calling Patient / Member / Family / Caregiver Call Type Triage / Clinical Relationship To Patient Self Return Phone Number [phone] (Primary) Chief Complaint Weakness, Localized Reason for Call Symptomatic / Request for [place] states she has diarrhea, nauseous, weak, states she is so weak that she when she stood up from the toilet she had to lay down on the ground. She also states she is supper lathargic. Translation No Nurse Assessment Nurse: Rolin Barry, RN, Levada Dy Date/Time Eilene Ghazi Time): 02/03/2021 1:55:47 PM Confirm and document reason for call. If symptomatic, describe symptoms. ---Caller states she has diarrhea, nauseous, weak, states she is so weak that she when she stood up from the toilet she had to lay down on the ground. She also states she is super lethargic. Advised that son is with her, he is 42, caller is still in the floor. Caller is still in the floor. Caller states that she was not able to get up. Does the patient have any new or worsening symptoms? ---Yes Will a triage be completed? ---Yes Related visit to physician within the last 2 weeks? ---No Does the PT have any chronic conditions? (i.e. diabetes, asthma, this includes High risk factors for pregnancy, etc.) ---Yes List chronic conditions. ---asthma allergies Is the patient pregnant or possibly pregnant? (Ask all females between the ages of 48-55) ---No Is this a behavioral health or substance abuse call? ---No

## 2021-02-03 NOTE — ED Triage Notes (Addendum)
Pt BIB EMS from home. Pt c/o abdominal pain, nausea, and diarrhea starting this morning. Pt c/o generalized weakness starting today. Pt states her son had a stomach bug recently and she believes that is what she has. Pt denies emesis.   Zofran 4 mg IV given by EMS 400 mL NS given IV  CBG 124

## 2021-02-03 NOTE — Telephone Encounter (Signed)
Guidelines Guideline Title Affirmed Question Affirmed Notes Nurse Date/Time (Eastern Time) Weakness (Generalized) and Fatigue Shock suspected (e.g., cold/pale/ clammy skin, too weak to stand, low BP, rapid pulse) Deaton, RN, Levada Dy 02/03/2021 1:58:06 PM Disp. Time Eilene Ghazi Time) Disposition Final User 02/03/2021 2:07:17 PM 911 Outcome Documentation Deaton, RN, Levada Dy Reason: Call went to voice mail. 02/03/2021 1:59:03 PM Call EMS 911 Now Yes Deaton, RN, Cindee Lame Disagree/Comply Comply Caller Understands Yes PreDisposition Did not know what to do Care Advice Given Per Guideline CALL EMS 911 NOW: * Immediate medical attention is needed. You need to hang up and call 911 (or an ambulance). * Triager Discretion: I'll call you back in a few minutes to be sure you were able to reach them. FIRST AID - LIE DOWN FOR SHOCK: * Lie down with feet elevated. * Reason: Treatment for shock. CARE ADVICE given per Weakness and Fatigue (Adult) guideline. Comments User: Saverio Danker, RN Date/Time Eilene Ghazi Time): 02/03/2021 2:01:54 PM Caller was awake, sounded very weak , advised that she was unable to get out of the floor. Has her 40 year old with her. Her mother is on the way to pick him up. Caller gave address and is calling 911, advised that I will call her back in 5 minutes. User: Saverio Danker, RN Date/Time Eilene Ghazi Time): 02/03/2021 2:09:57 PM Called to make sure that an ambulance was on the way. Spoke with [facility] and EMS on the way.

## 2021-02-04 NOTE — Telephone Encounter (Signed)
Nurse Assessment Nurse: Phoebe Perch, RN, Dagoberto Date/Time Eilene Ghazi Time): 02/03/2021 7:37:29 PM Confirm and document reason for call. If symptomatic, describe symptoms. ---Caller states she is in the waiting room at the ER. Caller was referred there for weakness, diarrhea and stomach pain. Wants to know if she should continue to wait or leave? Does the patient have any new or worsening symptoms? ---Yes Will a triage be completed? ---No Select reason for no triage. ---Other Please document clinical information provided and list any resource used. ---Caller already in ER. Referred by PCP office. Advised caller to follow PCP instructions and wait until she can get seen in ED. Advised to wait and to get seen by ER doc especially for weakness, diarrhea and abdominal pain symptoms. Caller states she was already triaged and waiting for room. Caller verbalized understanding. Disp. Time Eilene Ghazi Time) Disposition Final User 02/03/2021 7:43:13 PM Clinical Call Yes Phoebe Perch, RN, Dagoberto

## 2021-02-04 NOTE — Telephone Encounter (Signed)
fyi

## 2021-02-15 ENCOUNTER — Other Ambulatory Visit: Payer: Self-pay | Admitting: Family Medicine

## 2021-02-15 ENCOUNTER — Other Ambulatory Visit (HOSPITAL_COMMUNITY): Payer: Self-pay

## 2021-02-15 MED ORDER — LEVOCETIRIZINE DIHYDROCHLORIDE 5 MG PO TABS
5.0000 mg | ORAL_TABLET | Freq: Every evening | ORAL | 2 refills | Status: DC
  Filled 2021-02-15: qty 30, 30d supply, fill #0
  Filled 2021-03-26: qty 30, 30d supply, fill #1
  Filled 2021-05-04: qty 30, 30d supply, fill #2

## 2021-03-18 ENCOUNTER — Other Ambulatory Visit (HOSPITAL_COMMUNITY): Payer: Self-pay

## 2021-03-26 ENCOUNTER — Other Ambulatory Visit (HOSPITAL_COMMUNITY): Payer: Self-pay

## 2021-04-07 ENCOUNTER — Other Ambulatory Visit (HOSPITAL_COMMUNITY): Payer: Self-pay

## 2021-04-07 ENCOUNTER — Telehealth: Payer: 59 | Admitting: Nurse Practitioner

## 2021-04-07 DIAGNOSIS — J4521 Mild intermittent asthma with (acute) exacerbation: Secondary | ICD-10-CM

## 2021-04-07 MED ORDER — PREDNISONE 10 MG (21) PO TBPK
ORAL_TABLET | ORAL | 0 refills | Status: DC
  Filled 2021-04-07: qty 21, 6d supply, fill #0

## 2021-04-07 MED ORDER — BENZONATATE 100 MG PO CAPS
100.0000 mg | ORAL_CAPSULE | Freq: Three times a day (TID) | ORAL | 0 refills | Status: DC | PRN
  Filled 2021-04-07: qty 20, 7d supply, fill #0

## 2021-04-07 MED ORDER — AZITHROMYCIN 250 MG PO TABS
ORAL_TABLET | ORAL | 0 refills | Status: DC
  Filled 2021-04-07: qty 6, 5d supply, fill #0

## 2021-04-07 NOTE — Progress Notes (Signed)
We are sorry that you are not feeling well.  Here is how we plan to help!  Based on your presentation I believe you most likely have A cough due to bacteria.  When patients have a fever and a productive cough with a change in color or increased sputum production, we are concerned about bacterial bronchitis.  If left untreated it can progress to pneumonia.  If your symptoms do not improve with your treatment plan it is important that you contact your provider.   I have prescribed Azithromyin 250 mg: two tablets now and then one tablet daily for 4 additonal days    In addition you may use A prescription cough medication called Tessalon Perles 100mg . You may take 1-2 capsules every 8 hours as needed for your cough.  Prednisone 10 mg daily for 6 days (see taper instructions below)  Directions for 6 day taper: Day 1: 2 tablets before breakfast, 1 after both lunch & dinner and 2 at bedtime Day 2: 1 tab before breakfast, 1 after both lunch & dinner and 2 at bedtime Day 3: 1 tab at each meal & 1 at bedtime Day 4: 1 tab at breakfast, 1 at lunch, 1 at bedtime Day 5: 1 tab at breakfast & 1 tab at bedtime Day 6: 1 tab at breakfast  From your responses in the eVisit questionnaire you describe inflammation in the upper respiratory tract which is causing a significant cough.  This is commonly called Bronchitis and has four common causes:   Allergies Viral Infections Acid Reflux Bacterial Infection Allergies, viruses and acid reflux are treated by controlling symptoms or eliminating the cause. An example might be a cough caused by taking certain blood pressure medications. You stop the cough by changing the medication. Another example might be a cough caused by acid reflux. Controlling the reflux helps control the cough.  USE OF BRONCHODILATOR ("RESCUE") INHALERS: There is a risk from using your bronchodilator too frequently.  The risk is that over-reliance on a medication which only relaxes the muscles  surrounding the breathing tubes can reduce the effectiveness of medications prescribed to reduce swelling and congestion of the tubes themselves.  Although you feel brief relief from the bronchodilator inhaler, your asthma may actually be worsening with the tubes becoming more swollen and filled with mucus.  This can delay other crucial treatments, such as oral steroid medications. If you need to use a bronchodilator inhaler daily, several times per day, you should discuss this with your provider.  There are probably better treatments that could be used to keep your asthma under control.     HOME CARE Only take medications as instructed by your medical team. Complete the entire course of an antibiotic. Drink plenty of fluids and get plenty of rest. Avoid close contacts especially the very young and the elderly Cover your mouth if you cough or cough into your sleeve. Always remember to wash your hands A steam or ultrasonic humidifier can help congestion.   GET HELP RIGHT AWAY IF: You develop worsening fever. You become short of breath You cough up blood. Your symptoms persist after you have completed your treatment plan MAKE SURE YOU  Understand these instructions. Will watch your condition. Will get help right away if you are not doing well or get worse.    Thank you for choosing an e-visit.  Your e-visit answers were reviewed by a board certified advanced clinical practitioner to complete your personal care plan. Depending upon the condition, your plan could  have included both over the counter or prescription medications.  Please review your pharmacy choice. Make sure the pharmacy is open so you can pick up prescription now. If there is a problem, you may contact your provider through [employer] and have the prescription routed to another pharmacy.  Your safety is important to [place]. If you have drug allergies check your prescription carefully.   For the next 24 hours you can use  MyChart to ask questions about today's visit, request a non-urgent call back, or ask for a work or school excuse. You will get an email in the next two days asking about your experience. I hope that your e-visit has been valuable and will speed your recovery.  5-10 minutes spent reviewing and documenting in chart.

## 2021-04-09 ENCOUNTER — Other Ambulatory Visit: Payer: Self-pay

## 2021-04-09 ENCOUNTER — Other Ambulatory Visit (HOSPITAL_COMMUNITY): Payer: Self-pay

## 2021-04-09 ENCOUNTER — Ambulatory Visit: Payer: 59 | Admitting: Family Medicine

## 2021-04-09 ENCOUNTER — Encounter: Payer: Self-pay | Admitting: Family Medicine

## 2021-04-09 VITALS — BP 139/84 | HR 72 | Temp 98.0°F | Ht 63.0 in | Wt 222.6 lb

## 2021-04-09 DIAGNOSIS — F445 Conversion disorder with seizures or convulsions: Secondary | ICD-10-CM

## 2021-04-09 DIAGNOSIS — R059 Cough, unspecified: Secondary | ICD-10-CM | POA: Diagnosis not present

## 2021-04-09 DIAGNOSIS — J40 Bronchitis, not specified as acute or chronic: Secondary | ICD-10-CM | POA: Diagnosis not present

## 2021-04-09 LAB — POC COVID19 BINAXNOW: SARS Coronavirus 2 Ag: NEGATIVE

## 2021-04-09 MED ORDER — HYDROCODONE BIT-HOMATROP MBR 5-1.5 MG/5ML PO SOLN
5.0000 mL | Freq: Three times a day (TID) | ORAL | 0 refills | Status: DC | PRN
  Filled 2021-04-09: qty 120, 8d supply, fill #0

## 2021-04-09 NOTE — Patient Instructions (Signed)
Please follow up if symptoms do not improve or as needed.    I've ordered stronger cough medicines for you to take at night to help you rest.   Complete the Zpak and prednisone. Your lungs are clear and you are stable. You should improve over the next several days.
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
## 2021-04-17 ENCOUNTER — Other Ambulatory Visit (HOSPITAL_COMMUNITY): Payer: Self-pay

## 2021-04-19 ENCOUNTER — Other Ambulatory Visit (HOSPITAL_COMMUNITY): Payer: Self-pay

## 2021-05-04 ENCOUNTER — Other Ambulatory Visit (HOSPITAL_COMMUNITY): Payer: Self-pay

## 2021-05-10 DIAGNOSIS — H5213 Myopia, bilateral: Secondary | ICD-10-CM | POA: Diagnosis not present

## 2021-05-16 ENCOUNTER — Encounter: Payer: Self-pay | Admitting: Family Medicine

## 2021-05-17 ENCOUNTER — Other Ambulatory Visit: Payer: Self-pay | Admitting: Family Medicine

## 2021-05-17 ENCOUNTER — Other Ambulatory Visit (HOSPITAL_COMMUNITY): Payer: Self-pay

## 2021-05-17 MED ORDER — ESCITALOPRAM OXALATE 20 MG PO TABS
20.0000 mg | ORAL_TABLET | Freq: Every day | ORAL | 3 refills | Status: DC
  Filled 2021-05-17: qty 90, 90d supply, fill #0
  Filled 2021-12-01: qty 90, 90d supply, fill #1

## 2021-05-17 MED FILL — Omeprazole Cap Delayed Release 20 MG: ORAL | 90 days supply | Qty: 90 | Fill #2 | Status: AC

## 2021-06-03 ENCOUNTER — Other Ambulatory Visit (HOSPITAL_COMMUNITY): Payer: Self-pay

## 2021-06-14 ENCOUNTER — Encounter: Payer: Self-pay | Admitting: Family Medicine

## 2021-06-14 ENCOUNTER — Ambulatory Visit (INDEPENDENT_AMBULATORY_CARE_PROVIDER_SITE_OTHER): Payer: 59 | Admitting: Family Medicine

## 2021-06-14 ENCOUNTER — Other Ambulatory Visit (HOSPITAL_COMMUNITY): Payer: Self-pay

## 2021-06-14 VITALS — BP 127/87 | HR 76 | Temp 98.2°F | Ht 63.0 in | Wt 225.4 lb

## 2021-06-14 DIAGNOSIS — F329 Major depressive disorder, single episode, unspecified: Secondary | ICD-10-CM | POA: Diagnosis not present

## 2021-06-14 DIAGNOSIS — Z0000 Encounter for general adult medical examination without abnormal findings: Secondary | ICD-10-CM

## 2021-06-14 DIAGNOSIS — Z1231 Encounter for screening mammogram for malignant neoplasm of breast: Secondary | ICD-10-CM

## 2021-06-14 DIAGNOSIS — F445 Conversion disorder with seizures or convulsions: Secondary | ICD-10-CM

## 2021-06-14 DIAGNOSIS — J4540 Moderate persistent asthma, uncomplicated: Secondary | ICD-10-CM

## 2021-06-14 NOTE — Patient Instructions (Addendum)
Please return in 12 months for your annual complete physical; please come fasting.  ? ?I will release your lab results to you on your MyChart account with further instructions. You may see the results before I do, but when I review them I will send you a message with my report or have my assistant call you if things need to be discussed. Please reply to my message with any questions. Thank you!  ? ?If you have any questions or concerns, please don't hesitate to send me a message via MyChart or call the office at [phone]. Thank you for visiting with [place] today! It's our pleasure caring for you.  ? ?I have ordered a mammogram and/or bone density for you as we discussed today: ?'[x]'$   Mammogram  ?'[]'$   Bone Density ? ?Please call the office checked below to schedule your appointment: ? ?'[x]'$   [facility]     ? [address] [place], [place]       ? [phone]        ? ?'[]'$   [facility] ? [place]  ? [place], [place] ? [phone] ? ?Please do these things to maintain good health! ? ?Exercise at least 30-45 minutes a day,  4-5 days a week.  ?Eat a low-fat diet with lots of fruits and vegetables, up to 7-9 servings per day. ?Drink plenty of water daily. Try to drink 8 8oz glasses per day. ?Seatbelts can save your life. Always wear your seatbelt. ?Place Smoke Detectors on every level of your home and check batteries every year. ?Schedule an appointment with an eye doctor for an eye exam every 1-2 years ?Safe sex - use condoms to protect yourself from STDs if you could be exposed to these types of infections. Use birth control if you do not want to become pregnant and are sexually active. ?Avoid heavy alcohol use. If you drink, keep it to less than 2 drinks/day and not every day. ?[place].  Choose someone you trust that could speak for you if you became unable to speak for yourself. ?Depression is common in our stressful world.If you're feeling down  or losing interest in things you normally enjoy, please come in for a visit. ?If anyone is threatening or hurting you, please get help. Physical or Emotional Violence is never OK.   ?
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
## 2021-06-15 LAB — CBC WITH DIFFERENTIAL/PLATELET
Basophils Absolute: 0.1 10*3/uL (ref 0.0–0.1)
Basophils Relative: 1.1 % (ref 0.0–3.0)
Eosinophils Absolute: 0.1 10*3/uL (ref 0.0–0.7)
Eosinophils Relative: 1.8 % (ref 0.0–5.0)
HCT: 39 % (ref 36.0–46.0)
Hemoglobin: 12.8 g/dL (ref 12.0–15.0)
Lymphocytes Relative: 41.6 % (ref 12.0–46.0)
Lymphs Abs: 2.1 10*3/uL (ref 0.7–4.0)
MCHC: 32.9 g/dL (ref 30.0–36.0)
MCV: 93.5 fl (ref 78.0–100.0)
Monocytes Absolute: 0.4 10*3/uL (ref 0.1–1.0)
Monocytes Relative: 7.4 % (ref 3.0–12.0)
Neutro Abs: 2.4 10*3/uL (ref 1.4–7.7)
Neutrophils Relative %: 48.1 % (ref 43.0–77.0)
Platelets: 328 10*3/uL (ref 150.0–400.0)
RBC: 4.17 Mil/uL (ref 3.87–5.11)
RDW: 13.3 % (ref 11.5–15.5)
WBC: 5.1 10*3/uL (ref 4.0–10.5)

## 2021-06-15 LAB — COMPREHENSIVE METABOLIC PANEL
ALT: 11 U/L (ref 0–35)
AST: 21 U/L (ref 0–37)
Albumin: 4.1 g/dL (ref 3.5–5.2)
Alkaline Phosphatase: 58 U/L (ref 39–117)
BUN: 7 mg/dL (ref 6–23)
CO2: 25 mEq/L (ref 19–32)
Calcium: 8.9 mg/dL (ref 8.4–10.5)
Chloride: 101 mEq/L (ref 96–112)
Creatinine, Ser: 0.71 mg/dL (ref 0.40–1.20)
GFR: 106.5 mL/min (ref >60.00)
Glucose, Bld: 67 mg/dL — ABNORMAL LOW (ref 70–99)
Potassium: 3.8 mEq/L (ref 3.5–5.1)
Sodium: 135 mEq/L (ref 135–145)
Total Bilirubin: 0.5 mg/dL (ref 0.2–1.2)
Total Protein: 7.1 g/dL (ref 6.0–8.3)

## 2021-06-15 LAB — LIPID PANEL
Cholesterol: 143 mg/dL (ref 0–200)
HDL: 57.5 mg/dL (ref >39.00)
LDL Cholesterol: 77 mg/dL (ref 0–99)
NonHDL: 85.92
Total CHOL/HDL Ratio: 2
Triglycerides: 47 mg/dL (ref 0.0–149.0)
VLDL: 9.4 mg/dL (ref 0.0–40.0)

## 2021-06-15 LAB — TSH: TSH: 1.2 u[IU]/mL (ref 0.35–5.50)

## 2021-06-23 ENCOUNTER — Other Ambulatory Visit (HOSPITAL_COMMUNITY): Payer: Self-pay

## 2021-06-23 ENCOUNTER — Telehealth: Payer: 59 | Admitting: Family

## 2021-06-23 DIAGNOSIS — J069 Acute upper respiratory infection, unspecified: Secondary | ICD-10-CM | POA: Diagnosis not present

## 2021-06-23 MED ORDER — BENZONATATE 100 MG PO CAPS
100.0000 mg | ORAL_CAPSULE | Freq: Three times a day (TID) | ORAL | 0 refills | Status: DC | PRN
  Filled 2021-06-23: qty 20, 7d supply, fill #0

## 2021-06-23 MED ORDER — FLUTICASONE PROPIONATE 50 MCG/ACT NA SUSP
2.0000 | Freq: Every day | NASAL | 6 refills | Status: DC
  Filled 2021-06-23: qty 16, 30d supply, fill #0

## 2021-06-23 NOTE — Progress Notes (Signed)

## 2021-06-27 ENCOUNTER — Encounter: Payer: Self-pay | Admitting: Family Medicine

## 2021-06-30 ENCOUNTER — Other Ambulatory Visit: Payer: Self-pay | Admitting: Family Medicine

## 2021-07-01 ENCOUNTER — Other Ambulatory Visit (HOSPITAL_COMMUNITY): Payer: Self-pay

## 2021-07-01 MED ORDER — LEVOCETIRIZINE DIHYDROCHLORIDE 5 MG PO TABS
5.0000 mg | ORAL_TABLET | Freq: Every evening | ORAL | 2 refills | Status: DC
Start: 2021-07-01 — End: 2022-01-11
  Filled 2021-07-01: qty 30, 30d supply, fill #0
  Filled 2021-09-26: qty 30, 30d supply, fill #1
  Filled 2021-11-17: qty 30, 30d supply, fill #2

## 2021-07-12 ENCOUNTER — Ambulatory Visit
Admission: RE | Admit: 2021-07-12 | Discharge: 2021-07-12 | Disposition: A | Discharge status: Home / Self Care | Payer: 59 | Source: Ambulatory Visit | Attending: Family Medicine | Admitting: Family Medicine

## 2021-07-12 DIAGNOSIS — Z0000 Encounter for general adult medical examination without abnormal findings: Secondary | ICD-10-CM

## 2021-07-12 DIAGNOSIS — Z1231 Encounter for screening mammogram for malignant neoplasm of breast: Secondary | ICD-10-CM

## 2021-08-04 ENCOUNTER — Other Ambulatory Visit (HOSPITAL_COMMUNITY): Payer: Self-pay

## 2021-08-04 ENCOUNTER — Other Ambulatory Visit: Payer: Self-pay | Admitting: Allergy and Immunology

## 2021-08-04 ENCOUNTER — Telehealth: Payer: Self-pay | Admitting: Allergy and Immunology

## 2021-08-04 MED ORDER — BECLOMETHASONE DIPROPIONATE 80 MCG/ACT NA AERS
1.0000 | INHALATION_SPRAY | Freq: Two times a day (BID) | NASAL | 0 refills | Status: DC
  Filled 2021-08-04: qty 10.6, 30d supply, fill #0

## 2021-08-04 NOTE — Telephone Encounter (Signed)
Courtesy refill has been sent to the requested pharmacy. Left detailed message informing patient to keep her office visit on 08/31/2021 for further refills. ?

## 2021-08-04 NOTE — Telephone Encounter (Signed)
Patient has appointment on 08/31/2021 with dr Neldon Mc and needs to have qnasl called into [facility] on [address] [phone] ?

## 2021-08-05 ENCOUNTER — Other Ambulatory Visit (HOSPITAL_COMMUNITY): Payer: Self-pay

## 2021-08-20 ENCOUNTER — Other Ambulatory Visit: Payer: Self-pay | Admitting: Family Medicine

## 2021-08-20 ENCOUNTER — Other Ambulatory Visit (HOSPITAL_COMMUNITY): Payer: Self-pay

## 2021-08-24 ENCOUNTER — Other Ambulatory Visit: Payer: Self-pay | Admitting: Family Medicine

## 2021-08-24 ENCOUNTER — Other Ambulatory Visit (HOSPITAL_COMMUNITY): Payer: Self-pay

## 2021-08-25 ENCOUNTER — Other Ambulatory Visit (HOSPITAL_COMMUNITY): Payer: Self-pay

## 2021-08-25 ENCOUNTER — Other Ambulatory Visit: Payer: Self-pay

## 2021-08-25 MED ORDER — LAMOTRIGINE 100 MG PO TABS
100.0000 mg | ORAL_TABLET | Freq: Every day | ORAL | 5 refills | Status: DC
  Filled 2021-08-25: qty 30, 30d supply, fill #0
  Filled 2021-10-27: qty 30, 30d supply, fill #1
  Filled 2021-11-05 – 2021-12-24 (×2): qty 30, 30d supply, fill #2
  Filled 2022-02-04: qty 30, 30d supply, fill #3
  Filled 2022-04-22: qty 30, 30d supply, fill #4
  Filled 2022-07-24 – 2022-07-26 (×2): qty 30, 30d supply, fill #5

## 2021-08-27 ENCOUNTER — Encounter: Payer: 59 | Admitting: Family Medicine

## 2021-08-31 ENCOUNTER — Encounter: Payer: Self-pay | Admitting: Allergy and Immunology

## 2021-08-31 ENCOUNTER — Ambulatory Visit (INDEPENDENT_AMBULATORY_CARE_PROVIDER_SITE_OTHER): Payer: 59 | Admitting: Allergy and Immunology

## 2021-08-31 ENCOUNTER — Other Ambulatory Visit (HOSPITAL_COMMUNITY): Payer: Self-pay

## 2021-08-31 VITALS — BP 136/88 | HR 73 | Temp 97.9°F | Resp 18 | Ht 62.0 in | Wt 224.2 lb

## 2021-08-31 DIAGNOSIS — J4540 Moderate persistent asthma, uncomplicated: Secondary | ICD-10-CM

## 2021-08-31 DIAGNOSIS — J3089 Other allergic rhinitis: Secondary | ICD-10-CM | POA: Diagnosis not present

## 2021-08-31 DIAGNOSIS — K219 Gastro-esophageal reflux disease without esophagitis: Secondary | ICD-10-CM | POA: Diagnosis not present

## 2021-08-31 MED ORDER — BECLOMETHASONE DIPROPIONATE 80 MCG/ACT NA AERS
1.0000 | INHALATION_SPRAY | Freq: Every day | NASAL | 2 refills | Status: DC
Start: 2021-08-31 — End: 2022-01-11
  Filled 2021-08-31 – 2021-11-17 (×2): qty 10.6, 30d supply, fill #0

## 2021-08-31 MED ORDER — OMEPRAZOLE 40 MG PO CPDR
40.0000 mg | DELAYED_RELEASE_CAPSULE | Freq: Every morning | ORAL | 1 refills | Status: DC
  Filled 2021-08-31 – 2021-11-17 (×2): qty 90, 90d supply, fill #0

## 2021-08-31 MED ORDER — ADVAIR DISKUS 250-50 MCG/ACT IN AEPB
1.0000 | INHALATION_SPRAY | Freq: Two times a day (BID) | RESPIRATORY_TRACT | 5 refills | Status: DC
  Filled 2021-08-31 – 2021-09-26 (×2): qty 60, 30d supply, fill #0

## 2021-08-31 MED ORDER — ALBUTEROL SULFATE HFA 108 (90 BASE) MCG/ACT IN AERS
2.0000 | INHALATION_SPRAY | RESPIRATORY_TRACT | 5 refills | Status: DC
  Filled 2021-08-31: qty 18, 25d supply, fill #0

## 2021-08-31 MED ORDER — FAMOTIDINE 40 MG PO TABS
40.0000 mg | ORAL_TABLET | Freq: Every day | ORAL | 1 refills | Status: DC
  Filled 2021-08-31: qty 90, 90d supply, fill #0

## 2021-08-31 NOTE — Progress Notes (Unsigned)
[facility]   Follow-up Note  Referring Provider: Leamon Arnt, MD Primary Provider: Leamon Arnt, MD Date of Office Visit: 08/31/2021  Subjective:   Jennifer Dean (DOB: Nov 18, 1980) is a 41 y.o. female who returns to the [facility] on 08/31/2021 in re-evaluation of the following:  HPI: [name] returns to this clinic in evaluation of asthma and allergic rhinitis.  Her last visit in this clinic was 21 July 2020.  She is doing quite well with her asthma and has not required a systemic steroid to treat an exacerbation and rarely uses a short acting bronchodilator and can exert herself to the extent that she so desires while using Advair mostly 1 time per day.  She has had very little problems with her upper airways and has not required an antibiotic to treat an episode of sinusitis while she uses Qnasl mostly just 1 spray each nostril 1 time per day.  She has been having issues with throat clearing and intermittent raspy voice and a tickle/feather in her throat.  She is using omeprazole at 20 mg a day and if she misses this medication for a few days then she has regurgitation and burning.  She drinks 1 coffee, 2 teas, and eats chocolate daily.  Allergies as of 08/31/2021       Reactions   Morphine And Related Nausea And Vomiting   Codeine Anxiety   Dry mouth, uneasy feeling        Medication List    Advair Diskus 250-50 MCG/ACT Aepb Generic drug: fluticasone-salmeterol Inhale 1 puff into the lungs 2 (two) times daily.   albuterol 108 (90 Base) MCG/ACT inhaler Commonly known as: VENTOLIN HFA INHALE 2 PUFFS INTO THE LUNGS EVERY 4 (FOUR) HOURS AS NEEDED FOR WHEEZING OR SHORTNESS OF BREATH.   escitalopram 20 MG tablet Commonly known as: LEXAPRO Take 1 tablet (20 mg total) by mouth daily.   lamoTRIgine 100 MG tablet Commonly known as: LaMICtal Take 1 tablet (100 mg total) by mouth daily.    levocetirizine 5 MG tablet Commonly known as: Xyzal Allergy 24HR Take 1 tablet (5 mg total) by mouth every evening.   Nexplanon 68 MG Impl implant Generic drug: etonogestrel 1 each by Subdermal route once.   omeprazole 20 MG capsule Commonly known as: PRILOSEC TAKE 1 CAPSULE (20 MG TOTAL) BY MOUTH DAILY.   Qnasl 80 MCG/ACT Aers Generic drug: Beclomethasone Dipropionate Use 1 spray in the nose 2 (two) times daily.    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   HENT: Negative.    Eyes: Negative.   Respiratory: Negative.    Cardiovascular: Negative.   Gastrointestinal: Negative.   Genitourinary: Negative.   Musculoskeletal: Negative.   Skin: Negative.   Neurological: Negative.   Endo/Heme/Allergies: Negative.   Psychiatric/Behavioral: Negative.  Objective:   Vitals:   08/31/21 1636  BP: 136/88  Pulse: 73  Resp: 18  Temp: 97.9 F (36.6 C)  SpO2: 98%   Height: '5\' 2"'$  (157.5 cm)  Weight: 224 lb 3.2 oz (101.7 kg)   Physical Exam Constitutional:      Appearance: She is not diaphoretic.  HENT:     Head: Normocephalic.     Right Ear: Tympanic membrane, ear canal and external ear normal.     Left Ear: Tympanic membrane, ear canal and external ear normal.     Nose: Nose normal. No mucosal edema or rhinorrhea.     Mouth/Throat:     Pharynx: Uvula midline. No oropharyngeal exudate.  Eyes:     Conjunctiva/sclera: Conjunctivae normal.  Neck:     Thyroid: No  thyromegaly.     Trachea: Trachea normal. No tracheal tenderness or tracheal deviation.  Cardiovascular:     Rate and Rhythm: Normal rate and regular rhythm.     Heart sounds: Normal heart sounds, S1 normal and S2 normal. No murmur heard. Pulmonary:     Effort: No respiratory distress.     Breath sounds: Normal breath sounds. No stridor. No wheezing or rales.  Lymphadenopathy:     Head:     Right side of head: No tonsillar adenopathy.     Left side of head: No tonsillar adenopathy.     Cervical: No cervical adenopathy.  Skin:    Findings: No erythema or rash.     Nails: There is no clubbing.  Neurological:     Mental Status: She is alert.    Diagnostics:    Spirometry was performed and demonstrated an FEV1 of 1.76 at 73 % of predicted.   Assessment and Plan:   1. Asthma, moderate persistent, well-controlled   2. Perennial and seasonal allergic rhinitis   3. LPRD (laryngopharyngeal reflux disease)     1.  Continue to treat and prevent inflammation:  A. Advair 250-1 inhalation 1-2 times per day depending on disease activity.  B. Qnasl 80 -1-2 puffs each nostril 1 time per day depending on disease activity.    2. Treat and prevent reflux / LPR:   A. Omeprazole 40 mg - 1 tablet in AM  B. Famotidine 40 mg - 1 tablet in PM  C. Minimize caffeine consumption  D. Replace throat clearing with drinking/swallowing maneuver  3.  If needed:   A.  Albuterol HFA -2 inhalations every 4-6 hours  B.  OTC nasal saline  C.  OTC antihistamine  4.  Return to clinic in 12 weeks or earlier if problem  Lind appears to be doing well regarding her atopic respiratory disease on her current plan and she will continue to use low-dose Advair and Qnasl.  But she appears to have some more LPR and we will treat her with a combination of omeprazole and famotidine for the next 12 weeks and see her back in this clinic at that point in time make a decision about further evaluation and treatment based  upon her response.  [name], MD Allergy / Immunology [place]

## 2021-08-31 NOTE — Patient Instructions (Addendum)
  1.  Continue to treat and prevent inflammation:  A. Advair 250-1 inhalation 1-2 times per day depending on disease activity.  B. Qnasl 80 -1-2 puffs each nostril 1 time per day depending on disease activity.    2. Treat and prevent reflux / LPR:   A. Omeprazole 40 mg - 1 tablet in AM  B. Famotidine 40 mg - 1 tablet in PM  C. Minimize caffeine consumption  D. Replace throat clearing with drinking/swallowing maneuver  3.  If needed:   A.  Albuterol HFA -2 inhalations every 4-6 hours  B.  OTC nasal saline  C.  OTC antihistamine  4.  Return to clinic in 12 weeks or earlier if problem

## 2021-09-01 ENCOUNTER — Encounter: Payer: Self-pay | Admitting: Allergy and Immunology

## 2021-09-01 ENCOUNTER — Other Ambulatory Visit (HOSPITAL_COMMUNITY): Payer: Self-pay

## 2021-09-27 ENCOUNTER — Other Ambulatory Visit (HOSPITAL_COMMUNITY): Payer: Self-pay

## 2021-10-28 ENCOUNTER — Other Ambulatory Visit (HOSPITAL_COMMUNITY): Payer: Self-pay

## 2021-11-05 ENCOUNTER — Other Ambulatory Visit (HOSPITAL_COMMUNITY): Payer: Self-pay

## 2021-11-17 ENCOUNTER — Other Ambulatory Visit (HOSPITAL_COMMUNITY): Payer: Self-pay

## 2021-11-24 ENCOUNTER — Other Ambulatory Visit (HOSPITAL_COMMUNITY): Payer: Self-pay

## 2021-12-02 ENCOUNTER — Other Ambulatory Visit (HOSPITAL_COMMUNITY): Payer: Self-pay

## 2021-12-07 ENCOUNTER — Ambulatory Visit: Payer: 59 | Admitting: Allergy and Immunology

## 2021-12-09 ENCOUNTER — Other Ambulatory Visit (HOSPITAL_COMMUNITY): Payer: Self-pay

## 2021-12-09 ENCOUNTER — Telehealth: Payer: 59 | Admitting: Physician Assistant

## 2021-12-09 DIAGNOSIS — U071 COVID-19: Secondary | ICD-10-CM

## 2021-12-09 MED ORDER — PROMETHAZINE-DM 6.25-15 MG/5ML PO SYRP
5.0000 mL | ORAL_SOLUTION | Freq: Four times a day (QID) | ORAL | 0 refills | Status: DC | PRN
  Filled 2021-12-09: qty 118, 6d supply, fill #0

## 2021-12-09 MED ORDER — BENZONATATE 100 MG PO CAPS
100.0000 mg | ORAL_CAPSULE | Freq: Three times a day (TID) | ORAL | 0 refills | Status: DC | PRN
  Filled 2021-12-09: qty 30, 10d supply, fill #0

## 2021-12-09 MED ORDER — MOLNUPIRAVIR EUA 200MG CAPSULE
4.0000 | ORAL_CAPSULE | Freq: Two times a day (BID) | ORAL | 0 refills | Status: AC
  Filled 2021-12-09: qty 40, 5d supply, fill #0

## 2021-12-09 NOTE — Patient Instructions (Signed)
Jennifer Dean, thank you for joining Jennifer Rio, PA-C for today's virtual visit.  While this provider is not your primary care provider (PCP), if your PCP is located in our provider database this encounter information will be shared with them immediately following your visit.  Consent: (Patient) Jennifer Dean provided verbal consent for this virtual visit at the beginning of the encounter.  Current Medications:  Current Outpatient Medications:    ADVAIR DISKUS 250-50 MCG/ACT AEPB, Inhale 1 puff into the lungs 1 to 2 times per day depending on disease activity, Disp: 60 each, Rfl: 5   albuterol (VENTOLIN HFA) 108 (90 Base) MCG/ACT inhaler, Inhale 2 puffs into the lungs every 4 to 6 hours as needed for wheezing or shortness of breath, Disp: 18 g, Rfl: 5   Beclomethasone Dipropionate 80 MCG/ACT AERS, Place 1-2 puffs into both nostrils daily depending on disease activity, Disp: 10.6 g, Rfl: 2   escitalopram (LEXAPRO) 20 MG tablet, Take 1 tablet (20 mg total) by mouth daily., Disp: 90 tablet, Rfl: 3   etonogestrel (NEXPLANON) 68 MG IMPL implant, 1 each by Subdermal route once., Disp: , Rfl:    famotidine (PEPCID) 40 MG tablet, Take 1 tablet (40 mg total) by mouth at bedtime., Disp: 90 tablet, Rfl: 1   fluticasone-salmeterol (ADVAIR) 250-50 MCG/ACT AEPB, Inhale 1 puff into the lungs 2 (two) times daily., Disp: 60 each, Rfl: 5   lamoTRIgine (LAMICTAL) 100 MG tablet, Take 1 tablet (100 mg total) by mouth daily., Disp: 30 tablet, Rfl: 5   levocetirizine (XYZAL ALLERGY 24HR) 5 MG tablet, Take 1 tablet (5 mg total) by mouth every evening., Disp: 30 tablet, Rfl: 2   omeprazole (PRILOSEC) 40 MG capsule, Take 1 capsule (40 mg total) by mouth in the morning., Disp: 90 capsule, Rfl: 1   Medications ordered in this encounter:  No orders of the defined types were placed in this encounter.    *If you need refills on other medications prior to your next appointment, please contact your  pharmacy*  Follow-Up: Call back or seek an in-person evaluation if the symptoms worsen or if the condition fails to improve as anticipated.  Other Instructions Please keep well-hydrated and get plenty of rest. Start a saline nasal rinse to flush out your nasal passages. You can use plain Mucinex to help thin congestion. If you have a humidifier, running in the bedroom at night. I want you to start OTC vitamin D3 1000 units daily, vitamin C 1000 mg daily, and a zinc supplement. Please take prescribed medications as directed.  You have been enrolled in a MyChart symptom monitoring program. Please answer these questions daily so we can keep track of how you are doing.  You were to quarantine for 5 days from onset of your symptoms.  After day 5, if you have had no fever and you are feeling better, you can end quarantine but need to mask for an additional 5 days. After day 5 if you have a fever or are having significant symptoms, please quarantine for full 10 days.  If you note any worsening of symptoms, any significant shortness of breath or any chest pain, please seek ER evaluation ASAP.  Please do not delay care!  COVID-19: What to Do if You Are Sick If you test positive and are an older adult or someone who is at high risk of getting very sick from COVID-19, treatment may be available. Contact a healthcare provider right away after a positive test to determine  if you are eligible, even if your symptoms are mild right now. You can also visit a Test to Treat location and, if eligible, receive a prescription from a provider. Don't delay: Treatment must be started within the first few days to be effective. If you have a fever, cough, or other symptoms, you might have COVID-19. Most people have mild illness and are able to recover at home. If you are sick: Keep track of your symptoms. If you have an emergency warning sign (including trouble breathing), call 911. Steps to help prevent the spread  of COVID-19 if you are sick If you are sick with COVID-19 or think you might have COVID-19, follow the steps below to care for yourself and to help protect other people in your home and community. Stay home except to get medical care Stay home. Most people with COVID-19 have mild illness and can recover at home without medical care. Do not leave your home, except to get medical care. Do not visit public areas and do not go to places where you are unable to wear a mask. Take care of yourself. Get rest and stay hydrated. Take over-the-counter medicines, such as acetaminophen, to help you feel better. Stay in touch with your doctor. Call before you get medical care. Be sure to get care if you have trouble breathing, or have any other emergency warning signs, or if you think it is an emergency. Avoid public transportation, ride-sharing, or taxis if possible. Get tested If you have symptoms of COVID-19, get tested. While waiting for test results, stay away from others, including staying apart from those living in your household. Get tested as soon as possible after your symptoms start. Treatments may be available for people with COVID-19 who are at risk for becoming very sick. Don't delay: Treatment must be started early to be effective--some treatments must begin within 5 days of your first symptoms. Contact your healthcare provider right away if your test result is positive to determine if you are eligible. Self-tests are one of several options for testing for the virus that causes COVID-19 and may be more convenient than laboratory-based tests and point-of-care tests. Ask your healthcare provider or your local health department if you need help interpreting your test results. You can visit your state, tribal, local, and territorial health department's website to look for the latest local information on testing sites. Separate yourself from other people As much as possible, stay in a specific room and away  from other people and pets in your home. If possible, you should use a separate bathroom. If you need to be around other people or animals in or outside of the home, wear a well-fitting mask. Tell your close contacts that they may have been exposed to COVID-19. An infected person can spread COVID-19 starting 48 hours (or 2 days) before the person has any symptoms or tests positive. By letting your close contacts know they may have been exposed to COVID-19, you are helping to protect everyone. See COVID-19 and Animals if you have questions about pets. If you are diagnosed with COVID-19, someone from the health department may call you. Answer the call to slow the spread. Monitor your symptoms Symptoms of COVID-19 include fever, cough, or other symptoms. Follow care instructions from your healthcare provider and local health department. Your local health authorities may give instructions on checking your symptoms and reporting information. When to seek emergency medical attention Look for emergency warning signs* for COVID-19. If someone is showing any of  these signs, seek emergency medical care immediately: Trouble breathing Persistent pain or pressure in the chest New confusion Inability to wake or stay awake Pale, gray, or blue-colored skin, lips, or nail beds, depending on skin tone *This list is not all possible symptoms. Please call your medical provider for any other symptoms that are severe or concerning to you. Call 911 or call ahead to your local emergency facility: Notify the operator that you are seeking care for someone who has or may have COVID-19. Call ahead before visiting your doctor Call ahead. Many medical visits for routine care are being postponed or done by phone or telemedicine. If you have a medical appointment that cannot be postponed, call your doctor's office, and tell them you have or may have COVID-19. This will help the office protect themselves and other patients. If  you are sick, wear a well-fitting mask You should wear a mask if you must be around other people or animals, including pets (even at home). Wear a mask with the best fit, protection, and comfort for you. You don't need to wear the mask if you are alone. If you can't put on a mask (because of trouble breathing, for example), cover your coughs and sneezes in some other way. Try to stay at least 6 feet away from other people. This will help protect the people around you. Masks should not be placed on young children under age 29 years, anyone who has trouble breathing, or anyone who is not able to remove the mask without help. Cover your coughs and sneezes Cover your mouth and nose with a tissue when you cough or sneeze. Throw away used tissues in a lined trash can. Immediately wash your hands with soap and water for at least 20 seconds. If soap and water are not available, clean your hands with an alcohol-based hand sanitizer that contains at least 60% alcohol. Clean your hands often Wash your hands often with soap and water for at least 20 seconds. This is especially important after blowing your nose, coughing, or sneezing; going to the bathroom; and before eating or preparing food. Use hand sanitizer if soap and water are not available. Use an alcohol-based hand sanitizer with at least 60% alcohol, covering all surfaces of your hands and rubbing them together until they feel dry. Soap and water are the best option, especially if hands are visibly dirty. Avoid touching your eyes, nose, and mouth with unwashed hands. Handwashing Tips Avoid sharing personal household items Do not share dishes, drinking glasses, cups, eating utensils, towels, or bedding with other people in your home. Wash these items thoroughly after using them with soap and water or put in the dishwasher. Clean surfaces in your home regularly Clean and disinfect high-touch surfaces (for example, doorknobs, tables, handles, light  switches, and countertops) in your "sick room" and bathroom. In shared spaces, you should clean and disinfect surfaces and items after each use by the person who is ill. If you are sick and cannot clean, a caregiver or other person should only clean and disinfect the area around you (such as your bedroom and bathroom) on an as needed basis. Your caregiver/other person should wait as long as possible (at least several hours) and wear a mask before entering, cleaning, and disinfecting shared spaces that you use. Clean and disinfect areas that may have blood, stool, or body fluids on them. Use household cleaners and disinfectants. Clean visible dirty surfaces with household cleaners containing soap or detergent. Then, use a household  disinfectant. Use a product from [employer] List N: Disinfectants for Coronavirus (OEHOZ-22). Be sure to follow the instructions on the label to ensure safe and effective use of the product. Many products recommend keeping the surface wet with a disinfectant for a certain period of time (look at "contact time" on the product label). You may also need to wear personal protective equipment, such as gloves, depending on the directions on the product label. Immediately after disinfecting, wash your hands with soap and water for 20 seconds. For completed guidance on cleaning and disinfecting your home, visit Complete Disinfection Guidance. Take steps to improve ventilation at home Improve ventilation (air flow) at home to help prevent from spreading COVID-19 to other people in your household. Clear out COVID-19 virus particles in the air by opening windows, using air filters, and turning on fans in your home. Use this interactive tool to learn how to improve air flow in your home. When you can be around others after being sick with COVID-19 Deciding when you can be around others is different for different situations. Find out when you can safely end home isolation. For any additional  questions about your care, contact your healthcare provider or state or local health department. 06/23/2020 Content source: [facility] ([facility]), Division of Viral Diseases This information is not intended to replace advice given to you by your health care provider. Make sure you discuss any questions you have with your health care provider. Document Revised: 08/06/2020 Document Reviewed: 08/06/2020 Elsevier Patient Education  2022 [employer].      If you have been instructed to have an in-person evaluation today at a local Urgent Care facility, please use the link below. It will take you to a list of all of our available [facility], including address, phone number and hours of operation. Please do not delay care.  [facility]  If you or a family member do not have a primary care provider, use the link below to schedule a visit and establish care. When you choose a [facility] primary care physician or advanced practice provider, you gain a long-term partner in health. Find a Primary Care Provider  Learn more about [facility]'s in-office and virtual care options: [facility]
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
## 2021-12-25 ENCOUNTER — Other Ambulatory Visit (HOSPITAL_COMMUNITY): Payer: Self-pay

## 2021-12-27 ENCOUNTER — Encounter: Payer: Self-pay | Admitting: *Deleted

## 2021-12-27 ENCOUNTER — Other Ambulatory Visit (HOSPITAL_COMMUNITY): Payer: Self-pay

## 2022-01-11 ENCOUNTER — Ambulatory Visit: Payer: 59 | Admitting: Allergy and Immunology

## 2022-01-11 ENCOUNTER — Encounter: Payer: Self-pay | Admitting: Allergy and Immunology

## 2022-01-11 ENCOUNTER — Other Ambulatory Visit (HOSPITAL_COMMUNITY): Payer: Self-pay

## 2022-01-11 VITALS — BP 132/78 | HR 87 | Temp 98.0°F | Resp 18 | Ht 62.0 in | Wt 226.6 lb

## 2022-01-11 DIAGNOSIS — J3089 Other allergic rhinitis: Secondary | ICD-10-CM | POA: Diagnosis not present

## 2022-01-11 DIAGNOSIS — J4540 Moderate persistent asthma, uncomplicated: Secondary | ICD-10-CM | POA: Diagnosis not present

## 2022-01-11 DIAGNOSIS — K219 Gastro-esophageal reflux disease without esophagitis: Secondary | ICD-10-CM

## 2022-01-11 MED ORDER — ADVAIR DISKUS 250-50 MCG/ACT IN AEPB
1.0000 | INHALATION_SPRAY | Freq: Two times a day (BID) | RESPIRATORY_TRACT | 5 refills | Status: DC
  Filled 2022-01-11: qty 60, 30d supply, fill #0
  Filled 2022-04-22 (×2): qty 60, 30d supply, fill #1

## 2022-01-11 MED ORDER — LEVOCETIRIZINE DIHYDROCHLORIDE 5 MG PO TABS
5.0000 mg | ORAL_TABLET | Freq: Every day | ORAL | 5 refills | Status: DC | PRN
  Filled 2022-01-11: qty 60, 30d supply, fill #0
  Filled 2022-06-20: qty 60, 30d supply, fill #1

## 2022-01-11 MED ORDER — BECLOMETHASONE DIPROPIONATE 80 MCG/ACT NA AERS
2.0000 | INHALATION_SPRAY | Freq: Every day | NASAL | 5 refills | Status: DC
  Filled 2022-01-11: qty 10.6, 30d supply, fill #0
  Filled 2022-06-02 – 2022-06-24 (×3): qty 10.6, 30d supply, fill #1

## 2022-01-11 MED ORDER — OMEPRAZOLE 40 MG PO CPDR
40.0000 mg | DELAYED_RELEASE_CAPSULE | Freq: Every morning | ORAL | 1 refills | Status: DC
  Filled 2022-01-11 – 2022-05-16 (×2): qty 90, 90d supply, fill #0

## 2022-01-11 MED ORDER — FAMOTIDINE 40 MG PO TABS
40.0000 mg | ORAL_TABLET | Freq: Every evening | ORAL | 1 refills | Status: DC
Start: 2022-01-11 — End: 2022-08-23
  Filled 2022-01-11: qty 90, 90d supply, fill #0

## 2022-01-11 NOTE — Progress Notes (Unsigned)
[place]   Follow-up Note  Referring Provider: Leamon Arnt, MD Primary Provider: Leamon Arnt, MD Date of Office Visit: 01/11/2022  Subjective:   Jennifer Dean (DOB: [birth year]) is a 41 y.o. female who returns to the [facility] on 01/11/2022 in re-evaluation of the following:  HPI: Jennifer Dean returns to this clinic in evaluation of asthma and allergic rhinitis.  I last saw her in this clinic on 01 July 2021.  She had an excellent interval of time without any significant asthmatic issues and rarely uses a short acting bronchodilator while she continues to use Advair mostly 1 time per day.  She can exercise with any difficulty and does not require the administration of a systemic steroid to treat an exacerbation.  Likewise her upper airway has really doing very well and she uses Qnasl mostly 1 spray each nostril 1 time per day.  She has not required an antibiotic treating episode of sinusitis.  Her reflux is under excellent control as is her throat issue while using omeprazole just 1 time per day.  Rarely does she use any famotidine.  Allergies as of 01/11/2022       Reactions   Morphine And Related Nausea And Vomiting   Codeine Anxiety   Dry mouth, uneasy feeling        Medication List    Advair Diskus 250-50 MCG/ACT Aepb Generic drug: fluticasone-salmeterol Inhale 1 puff into the lungs 1 to 2 times per day depending on disease activity   albuterol 108 (90 Base) MCG/ACT inhaler Commonly known as: VENTOLIN HFA Inhale 2 puffs into the lungs every 4 to 6 hours as needed for wheezing or shortness of breath   escitalopram 20 MG tablet Commonly known as: LEXAPRO Take 1 tablet (20 mg total) by mouth daily.   famotidine 40 MG tablet Commonly known as: PEPCID Take 1 tablet (40 mg total) by mouth at bedtime.   lamoTRIgine 100 MG tablet Commonly known as: LaMICtal Take 1 tablet (100 mg total) by  mouth daily.   levocetirizine 5 MG tablet Commonly known as: Xyzal Allergy 24HR Take 1 tablet (5 mg total) by mouth every evening.   Nexplanon 68 MG Impl implant Generic drug: etonogestrel 1 each by Subdermal route once.   omeprazole 40 MG capsule Commonly known as: PRILOSEC Take 1 capsule (40 mg total) by mouth in the morning.   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   HENT: Negative.    Eyes: Negative.   Respiratory: Negative.    Cardiovascular: Negative.   Gastrointestinal: Negative.   Genitourinary: Negative.   Musculoskeletal: Negative.   Skin: Negative.   Neurological: Negative.   Endo/Heme/Allergies: Negative.   Psychiatric/Behavioral: Negative.  Objective:   Vitals:   01/11/22 1611  BP: 132/78  Pulse: 87  Resp: 18  Temp: 98 F (36.7 C)  SpO2: 97%   Height: '5\' 2"'$  (157.5 cm)  Weight: 226 lb 9.6 oz (102.8 kg)   Physical Exam Constitutional:      Appearance: She is not diaphoretic.  HENT:     Head: Normocephalic.     Right Ear: Tympanic membrane, ear canal and external ear normal.     Left Ear: Tympanic membrane, ear canal and external ear normal.     Nose: Nose normal. No mucosal edema or rhinorrhea.     Mouth/Throat:     Pharynx: Uvula midline. No oropharyngeal exudate.  Eyes:      Conjunctiva/sclera: Conjunctivae normal.  Neck:     Thyroid: No thyromegaly.     Trachea: Trachea normal. No tracheal tenderness or tracheal deviation.  Cardiovascular:     Rate and Rhythm: Normal rate and regular rhythm.     Heart sounds: Normal heart sounds, S1 normal and S2 normal. No murmur heard. Pulmonary:     Effort: No respiratory distress.     Breath sounds: Normal breath sounds. No stridor. No wheezing or rales.  Lymphadenopathy:     Head:     Right side of head: No tonsillar adenopathy.     Left side of head: No tonsillar adenopathy.     Cervical: No cervical adenopathy.  Skin:    Findings: No erythema or rash.     Nails: There is no clubbing.  Neurological:     Mental Status: She is alert.     Diagnostics:    Spirometry was performed and demonstrated an FEV1 of 1.89 at 80 % of predicted.    Assessment and Plan:   1. Asthma, moderate persistent, well-controlled   2. Perennial and seasonal allergic rhinitis   3. LPRD (laryngopharyngeal reflux disease)    1.  Continue to treat and prevent inflammation:  A. Advair 250-1 inhalation 1-2 times per day    B. Qnasl 80 -1-2 puffs each nostril 1 time per day    2. Treat and prevent reflux / LPR:   A. Omeprazole 40 mg - 1 tablet in AM  3.  If needed:   A.  Albuterol HFA -2 inhalations every 4-6 hours  B.  OTC nasal saline  C.  OTC antihistamine  D.  Famotidine 40 mg - 1 tablet in PM  4.  Return to clinic in 6 months or earlier if problem  5.  Obtain flu Vaccine  Kwynn appears to be doing very well and she has a good understanding of her disease state and how her medications work and appropriate dosing of her medications depending on disease activity.  Assuming she continues to do well with this plan I will see her back in this clinic in 6 months or earlier if there is a problem.  [name], MD Allergy / Immunology [place]

## 2022-01-11 NOTE — Patient Instructions (Signed)
  1.  Continue to treat and prevent inflammation:  A. Advair 250-1 inhalation 1-2 times per day    B. Qnasl 80 -1-2 puffs each nostril 1 time per day    2. Treat and prevent reflux / LPR:   A. Omeprazole 40 mg - 1 tablet in AM  3.  If needed:   A.  Albuterol HFA -2 inhalations every 4-6 hours  B.  OTC nasal saline  C.  OTC antihistamine  D.  Famotidine 40 mg - 1 tablet in PM  4.  Return to clinic in 6 months or earlier if problem  5.  Obtain flu Vaccine

## 2022-01-12 ENCOUNTER — Other Ambulatory Visit (HOSPITAL_COMMUNITY): Payer: Self-pay

## 2022-01-12 ENCOUNTER — Encounter: Payer: Self-pay | Admitting: Allergy and Immunology

## 2022-01-20 ENCOUNTER — Other Ambulatory Visit (HOSPITAL_COMMUNITY): Payer: Self-pay

## 2022-02-01 ENCOUNTER — Other Ambulatory Visit (HOSPITAL_COMMUNITY): Payer: Self-pay

## 2022-02-01 MED ORDER — INFLUENZA VAC SPLIT QUAD 0.5 ML IM SUSY
PREFILLED_SYRINGE | INTRAMUSCULAR | 0 refills | Status: DC
  Filled 2022-02-01: qty 0.5, 1d supply, fill #0

## 2022-02-04 ENCOUNTER — Other Ambulatory Visit (HOSPITAL_COMMUNITY): Payer: Self-pay

## 2022-03-11 ENCOUNTER — Encounter: Payer: Self-pay | Admitting: Allergy and Immunology

## 2022-04-03 ENCOUNTER — Encounter: Payer: Self-pay | Admitting: Family Medicine

## 2022-04-13 ENCOUNTER — Other Ambulatory Visit (HOSPITAL_COMMUNITY): Payer: Self-pay

## 2022-04-13 ENCOUNTER — Telehealth: Payer: Commercial Managed Care - PPO | Admitting: Family

## 2022-04-13 DIAGNOSIS — J209 Acute bronchitis, unspecified: Secondary | ICD-10-CM | POA: Diagnosis not present

## 2022-04-13 MED ORDER — PROMETHAZINE-DM 6.25-15 MG/5ML PO SYRP
5.0000 mL | ORAL_SOLUTION | Freq: Three times a day (TID) | ORAL | 0 refills | Status: DC | PRN
  Filled 2022-04-13: qty 118, 8d supply, fill #0

## 2022-04-13 MED ORDER — BENZONATATE 100 MG PO CAPS
100.0000 mg | ORAL_CAPSULE | Freq: Three times a day (TID) | ORAL | 0 refills | Status: DC | PRN
  Filled 2022-04-13: qty 20, 7d supply, fill #0

## 2022-04-13 NOTE — Progress Notes (Signed)
We are sorry that you are not feeling well.  Here is how we plan to help!  Based on your presentation I believe you most likely have A cough due to a virus.  This is called viral bronchitis and is best treated by rest, plenty of fluids and control of the cough.  You may use Ibuprofen or Tylenol as directed to help your symptoms.     In addition you may use A non-prescription cough medication called Robitussin DAC. Take 2 teaspoons every 8 hours or Delsym: take 2 teaspoons every 12 hours., A non-prescription cough medication called Mucinex DM: take 2 tablets every 12 hours., and A prescription cough medication called Tessalon Perles '100mg'$ . You may take 1-2 capsules every 8 hours as needed for your cough.  I have also sent in promethazine-DM cough syrup to your pharmacy for night time.   From your responses in the eVisit questionnaire you describe inflammation in the upper respiratory tract which is causing a significant cough.  This is commonly called Bronchitis and has four common causes:   Allergies Viral Infections Acid Reflux Bacterial Infection Allergies, viruses and acid reflux are treated by controlling symptoms or eliminating the cause. An example might be a cough caused by taking certain blood pressure medications. You stop the cough by changing the medication. Another example might be a cough caused by acid reflux. Controlling the reflux helps control the cough.  USE OF BRONCHODILATOR ("RESCUE") INHALERS: There is a risk from using your bronchodilator too frequently.  The risk is that over-reliance on a medication which only relaxes the muscles surrounding the breathing tubes can reduce the effectiveness of medications prescribed to reduce swelling and congestion of the tubes themselves.  Although you feel brief relief from the bronchodilator inhaler, your asthma may actually be worsening with the tubes becoming more swollen and filled with mucus.  This can delay other crucial treatments,  such as oral steroid medications. If you need to use a bronchodilator inhaler daily, several times per day, you should discuss this with your provider.  There are probably better treatments that could be used to keep your asthma under control.     HOME CARE Only take medications as instructed by your medical team. Complete the entire course of an antibiotic. Drink plenty of fluids and get plenty of rest. Avoid close contacts especially the very young and the elderly Cover your mouth if you cough or cough into your sleeve. Always remember to wash your hands A steam or ultrasonic humidifier can help congestion.   GET HELP RIGHT AWAY IF: You develop worsening fever. You become short of breath You cough up blood. Your symptoms persist after you have completed your treatment plan MAKE SURE YOU  Understand these instructions. Will watch your condition. Will get help right away if you are not doing well or get worse.    Thank you for choosing an e-visit.  Your e-visit answers were reviewed by a board certified advanced clinical practitioner to complete your personal care plan. Depending upon the condition, your plan could have included both over the counter or prescription medications.  Please review your pharmacy choice. Make sure the pharmacy is open so you can pick up prescription now. If there is a problem, you may contact your provider through [employer] and have the prescription routed to another pharmacy.  Your safety is important to [place]. If you have drug allergies check your prescription carefully.   For the next 24 hours you can use MyChart to  ask questions about today's visit, request a non-urgent call back, or ask for a work or school excuse. You will get an email in the next two days asking about your experience. I hope that your e-visit has been valuable and will speed your recovery.  Approximately 5 minutes was spent documenting and reviewing patient's chart.

## 2022-04-14 ENCOUNTER — Other Ambulatory Visit (HOSPITAL_COMMUNITY): Payer: Self-pay

## 2022-04-22 ENCOUNTER — Other Ambulatory Visit (HOSPITAL_COMMUNITY): Payer: Self-pay

## 2022-04-22 MED ORDER — FLUTICASONE-SALMETEROL 250-50 MCG/ACT IN AEPB
1.0000 | INHALATION_SPRAY | Freq: Two times a day (BID) | RESPIRATORY_TRACT | 4 refills | Status: DC
  Filled 2022-04-22: qty 60, 30d supply, fill #0

## 2022-04-22 MED ORDER — FLUTICASONE-SALMETEROL 250-50 MCG/ACT IN AEPB
1.0000 | INHALATION_SPRAY | Freq: Two times a day (BID) | RESPIRATORY_TRACT | 3 refills | Status: DC
  Filled 2022-04-22 – 2022-05-16 (×2): qty 60, 30d supply, fill #0
  Filled 2022-08-17: qty 60, 30d supply, fill #1

## 2022-04-22 MED ORDER — FLUTICASONE-SALMETEROL 250-50 MCG/ACT IN AEPB: 1.0000 | INHALATION_SPRAY | Freq: Two times a day (BID) | RESPIRATORY_TRACT | 3 refills | Status: DC

## 2022-04-22 NOTE — Addendum Note (Signed)
Addended by: Clovis Cao A on: 04/22/2022 04:19 PM   Modules accepted: Orders

## 2022-04-25 ENCOUNTER — Other Ambulatory Visit (HOSPITAL_COMMUNITY): Payer: Self-pay

## 2022-04-26 ENCOUNTER — Other Ambulatory Visit (HOSPITAL_COMMUNITY): Payer: Self-pay

## 2022-04-26 ENCOUNTER — Encounter: Payer: Self-pay | Admitting: Family Medicine

## 2022-05-16 ENCOUNTER — Other Ambulatory Visit (HOSPITAL_COMMUNITY): Payer: Self-pay

## 2022-05-24 ENCOUNTER — Telehealth: Payer: Commercial Managed Care - PPO | Admitting: Physician Assistant

## 2022-05-24 ENCOUNTER — Other Ambulatory Visit (HOSPITAL_COMMUNITY): Payer: Self-pay

## 2022-05-24 DIAGNOSIS — B9689 Other specified bacterial agents as the cause of diseases classified elsewhere: Secondary | ICD-10-CM | POA: Diagnosis not present

## 2022-05-24 DIAGNOSIS — J019 Acute sinusitis, unspecified: Secondary | ICD-10-CM

## 2022-05-24 DIAGNOSIS — F4310 Post-traumatic stress disorder, unspecified: Secondary | ICD-10-CM | POA: Diagnosis not present

## 2022-05-24 MED ORDER — AMOXICILLIN-POT CLAVULANATE 875-125 MG PO TABS
1.0000 | ORAL_TABLET | Freq: Two times a day (BID) | ORAL | 0 refills | Status: DC
  Filled 2022-05-24: qty 14, 7d supply, fill #0

## 2022-05-24 NOTE — Progress Notes (Signed)

## 2022-06-06 DIAGNOSIS — F4310 Post-traumatic stress disorder, unspecified: Secondary | ICD-10-CM | POA: Diagnosis not present

## 2022-06-10 ENCOUNTER — Other Ambulatory Visit (HOSPITAL_COMMUNITY): Payer: Self-pay

## 2022-06-17 DIAGNOSIS — F4310 Post-traumatic stress disorder, unspecified: Secondary | ICD-10-CM | POA: Diagnosis not present

## 2022-06-21 ENCOUNTER — Other Ambulatory Visit: Payer: Self-pay

## 2022-06-21 ENCOUNTER — Other Ambulatory Visit (HOSPITAL_COMMUNITY): Payer: Self-pay

## 2022-06-24 ENCOUNTER — Other Ambulatory Visit (HOSPITAL_COMMUNITY): Payer: Self-pay

## 2022-06-30 ENCOUNTER — Encounter: Payer: Self-pay | Admitting: Allergy and Immunology

## 2022-06-30 NOTE — Telephone Encounter (Signed)
Thank you :)

## 2022-06-30 NOTE — Telephone Encounter (Signed)
I called and spoke with the patient and offered an appointment for today, she stated that she was not able to come in today because her father is ill and she is having to drive to [place] today to discuss his care plan with the providers. We were able to schedule her an appointment this coming Monday with Chrissie. I did advise that we do have on call doctors for nights and weekends if she needs [place] until then and that if she is unable to catch her breath to please call 911 for life threatening emergencies. Patient verbalized understanding.

## 2022-07-04 ENCOUNTER — Other Ambulatory Visit (HOSPITAL_COMMUNITY): Payer: Self-pay

## 2022-07-04 ENCOUNTER — Ambulatory Visit: Payer: Commercial Managed Care - PPO | Admitting: Family

## 2022-07-04 ENCOUNTER — Encounter: Payer: Self-pay | Admitting: Family

## 2022-07-04 ENCOUNTER — Other Ambulatory Visit: Payer: Self-pay

## 2022-07-04 VITALS — BP 116/80 | HR 78 | Temp 98.0°F | Resp 12 | Wt 235.6 lb

## 2022-07-04 DIAGNOSIS — J3089 Other allergic rhinitis: Secondary | ICD-10-CM

## 2022-07-04 DIAGNOSIS — J4540 Moderate persistent asthma, uncomplicated: Secondary | ICD-10-CM | POA: Diagnosis not present

## 2022-07-04 DIAGNOSIS — J45998 Other asthma: Secondary | ICD-10-CM | POA: Diagnosis not present

## 2022-07-04 DIAGNOSIS — K219 Gastro-esophageal reflux disease without esophagitis: Secondary | ICD-10-CM | POA: Diagnosis not present

## 2022-07-04 MED ORDER — ALBUTEROL SULFATE HFA 108 (90 BASE) MCG/ACT IN AERS
2.0000 | INHALATION_SPRAY | RESPIRATORY_TRACT | 1 refills | Status: DC
  Filled 2022-07-04: qty 6.7, 25d supply, fill #0

## 2022-07-04 NOTE — Patient Instructions (Addendum)
  1.  Continue to treat and prevent inflammation:  A.Increase Wixela 250-1 inhalation 2 times per day to prevent cough and wheeze. Rinse mouth out after   B. Qnasl 80 -1-2 puffs each nostril 1 time per day    2. Treat and prevent reflux / LPR:   A. Omeprazole 40 mg - 1 tablet in AM  B. Famotidine 40 mg- 1 tablet at night  C. Start dietary and lifestyle modifications as below  3.  If needed:   A.  Albuterol HFA -2 inhalations every 4-6 hours. Refill sent. Spacer given  B.  OTC nasal saline  C.  OTC antihistamine    4.  Return to clinic in 6 weeks or earlier if problem  5.  Obtain flu Vaccine in fall 6. I will refer to ENT ( Dr. Mila Homer) for possible vocal cord dysfunction  Lifestyle Changes for Controlling GERD When you have GERD, stomach acid feels as if it's backing up toward your mouth. Whether or not you take medication to control your GERD, your symptoms can often be improved with lifestyle changes.   Raise Your Head Reflux is more likely to strike when you're lying down flat, because stomach fluid can flow backward more easily. Raising the head of your bed 4-6 inches can help. To do this: Slide blocks or books under the legs at the head of your bed. Or, place a wedge under the mattress. Many foam stores can make a suitable wedge for you. The wedge should run from your waist to the top of your head. Don't just prop your head on several pillows. This increases pressure on your stomach. It can make GERD worse.  Watch Your Eating Habits Certain foods may increase the acid in your stomach or relax the lower esophageal sphincter, making GERD more likely. It's best to avoid the following: Coffee, tea, and carbonated drinks (with and without caffeine) Fatty, fried, or spicy food Mint, chocolate, onions, and tomatoes Any other foods that seem to irritate your stomach or cause you pain  Relieve the Pressure Eat smaller meals, even if you have to eat more often. Don't  lie down right after you eat. Wait a few hours for your stomach to empty. Avoid tight belts and tight-fitting clothes. Lose excess weight.  Tobacco and Alcohol Avoid smoking tobacco and drinking alcohol. They can make GERD symptoms worse.

## 2022-07-04 NOTE — Progress Notes (Signed)
[facility] [address] Dept: [phone]  FOLLOW UP NOTE  Patient ID: Jennifer Dean, female    DOB: July 14, 1980  Age: 42 y.o. MRN: [id number] Date of Office Visit: 07/04/2022  Assessment  Chief Complaint: Shortness of Breath (During the night with throat closure. 6 months issue and happened 4 to 5 times)  HPI Jennifer Dean is a 42 year old female who presents today for an acute visit of throat closure that wakes her up at night.  She was last seen on January 11, 2022 by Dr. Neldon Mc for well-controlled moderate persistent asthma, seasonal and perennial allergic rhinitis, and laryngopharyngeal reflux disease.  She denies any new diagnosis or surgery since her last office visit.  She does mention that she gets laryngitis 2-3 times a year.  She reports that for the past 6 months she has had 4-5 occurrences of her waking up at night feeling like her throat is completely closed.  She is gasping and coughing.  She denies hearing any high-pitched noises.  She mentions that the last time this occurred it took longer for her throat to open up.  It took a couple minutes or so to feel like it was open.  She denies any concomitant rashes or gastrointestinal symptoms.  This feeling always occur in the middle of the night.  She looked the symptoms up on "Dr. Essie Hart" and wonders if silent reflux could be the cause.  She does have reflux.  She denies any family history of angioedema and is not taking any new medications and there is no correlation with food.  Reflux: She does report that she has silent reflux and takes Prilosec once a day, but not at the same time.  She only has been taking famotidine as needed.  She does drink caffeinated drinks such as sweet tea and does eat too much chocolate.  She does also eat tomato-based foods.  She does admit that she has been under a lot of stress with her father being very ill.  Asthma: She is currently taking Wixela 250/50 mcg 1 puff once a day  and has albuterol that is expired.  She reports a dry cough and shortness of breath with exertion.  She denies wheezing, tightness in chest, fever, and chills.  She has not tried using albuterol for her cough.  She reports very little use of her albuterol inhaler.  She is asking for a spacer.  She does feel like a spacer will help get the albuterol down in her lungs.  Since her last office visit she has not required any systemic steroids or made any trips to the emergency room or urgent care due to breathing problems.  Perennial and seasonal allergic rhinitis: She reports clear rhinorrhea that mainly occurs out of the right nostril.  She denies postnasal drip and nasal congestion.  She does mention that she clears her throat.  She has not had any sinus infections since we last saw her.   Drug Allergies:  Allergies  Allergen Reactions   Morphine And Related Nausea And Vomiting   Codeine Anxiety    Dry mouth, uneasy feeling    Review of Systems: Review of Systems  Constitutional:  Negative for chills and fever.  HENT:         Reports 4-5 times over the past 6 months of waking up to sensation of throat closure.  Also reports clear rhinorrhea at times and denies postnasal drip and nasal congestion  Eyes:  Denies itchy watery eyes  Respiratory:  Positive for cough and shortness of breath. Negative for wheezing.        Reports dry cough and shortness of breath with exertion.  Denies wheezing and tightness in chest  Cardiovascular:  Negative for chest pain and palpitations.  Gastrointestinal:        Reports silent reflux   Skin:  Negative for itching and rash.  Neurological:  Positive for headaches.       Reports headache that lasted for 5 days  Endo/Heme/Allergies:  Positive for environmental allergies.     Physical Exam: BP 116/80   Pulse 78   Temp 98 F (36.7 C) (Temporal)   Resp 12   Wt 235 lb 9.6 oz (106.9 kg)   SpO2 99%   BMI 43.09 kg/m    Physical  Exam Constitutional:      Appearance: She is well-developed.  HENT:     Head: Normocephalic and atraumatic.     Comments: Pharynx normal, eyes normal, ears normal, nose: Bilateral lower turbinates mildly edematous with no drainage noted    Right Ear: Tympanic membrane, ear canal and external ear normal.     Left Ear: Tympanic membrane, ear canal and external ear normal.     Mouth/Throat:     Mouth: Mucous membranes are moist.     Pharynx: Oropharynx is clear.  Eyes:     Conjunctiva/sclera: Conjunctivae normal.  Cardiovascular:     Rate and Rhythm: Normal rate and regular rhythm.     Heart sounds: Normal heart sounds.  Pulmonary:     Effort: Pulmonary effort is normal.     Breath sounds: Normal breath sounds.     Comments: Lungs clear to auscultation Musculoskeletal:     Cervical back: Neck supple.  Skin:    General: Skin is warm.  Neurological:     Mental Status: She is alert and oriented to person, place, and time.  Psychiatric:        Mood and Affect: Mood normal.        Behavior: Behavior normal.        Thought Content: Thought content normal.        Judgment: Judgment normal.     Diagnostics: FVC 2.32 L (79%), FEV1 1.59 L (65%).  Spirometry indicates moderate airway obstruction.  Postbronchodilator response shows FVC 2.44 L (83%), FEV1 1.82 L (75%).  There is a 14% change in FEV1.  Spirometry indicates normal spirometry.  Assessment and Plan: 1. Not well controlled moderate persistent asthma   2. LPRD (laryngopharyngeal reflux disease)   3. Perennial and seasonal allergic rhinitis     No orders of the defined types were placed in this encounter.   Patient Instructions   1.  Continue to treat and prevent inflammation:  A.Increase Wixela 250-1 inhalation 2 times per day to prevent cough and wheeze. Rinse mouth out after   B. Qnasl 80 -1-2 puffs each nostril 1 time per day    2. Treat and prevent reflux / LPR:   A. Omeprazole 40 mg - 1 tablet in AM  B.  Famotidine 40 mg- 1 tablet at night  C. Start dietary and lifestyle modifications as below  3.  If needed:   A.  Albuterol HFA -2 inhalations every 4-6 hours. Refill sent. Spacer given  B.  OTC nasal saline  C.  OTC antihistamine    4.  Return to clinic in 6 weeks or earlier if problem  5.  Obtain flu Vaccine in  fall 6. I will refer to ENT ( Dr. Mila Homer) for possible vocal cord dysfunction  Lifestyle Changes for Controlling GERD When you have GERD, stomach acid feels as if it's backing up toward your mouth. Whether or not you take medication to control your GERD, your symptoms can often be improved with lifestyle changes.   Raise Your Head Reflux is more likely to strike when you're lying down flat, because stomach fluid can flow backward more easily. Raising the head of your bed 4-6 inches can help. To do this: Slide blocks or books under the legs at the head of your bed. Or, place a wedge under the mattress. Many foam stores can make a suitable wedge for you. The wedge should run from your waist to the top of your head. Don't just prop your head on several pillows. This increases pressure on your stomach. It can make GERD worse.  Watch Your Eating Habits Certain foods may increase the acid in your stomach or relax the lower esophageal sphincter, making GERD more likely. It's best to avoid the following: Coffee, tea, and carbonated drinks (with and without caffeine) Fatty, fried, or spicy food Mint, chocolate, onions, and tomatoes Any other foods that seem to irritate your stomach or cause you pain  Relieve the Pressure Eat smaller meals, even if you have to eat more often. Don't lie down right after you eat. Wait a few hours for your stomach to empty. Avoid tight belts and tight-fitting clothes. Lose excess weight.  Tobacco and Alcohol Avoid smoking tobacco and drinking alcohol. They can make GERD symptoms worse.  Return in about 6 weeks (around 08/15/2022), or  if symptoms worsen or fail to improve.    Thank you for the opportunity to care for this patient.  Please do not hesitate to contact me with questions.  Althea Charon, FNP [facility]

## 2022-07-08 ENCOUNTER — Telehealth: Payer: Self-pay

## 2022-07-08 NOTE — Telephone Encounter (Signed)
-----   Message from Nehemiah Settle, FNP sent at 07/04/2022  4:48 PM EDT ----- Refer to Dr. Maryfrances Bunnell (ENT) possible vocal cord dysfunction

## 2022-07-08 NOTE — Telephone Encounter (Signed)
Left a voicemail for the referral coordinator to call me back.   [facility] ENT/Head and Neck Surgery - [facility] [place] [address], [place] [address] Phone: [phone]  Will try back on next week.   I have faxed the referral to [phone]  Update sent to patient via MyChart

## 2022-07-13 NOTE — Telephone Encounter (Signed)
I spoke with their office and they ask that the patient call her insurance to make sure they are in network. I called and left the patient a voicemail to discuss.

## 2022-07-15 NOTE — Telephone Encounter (Signed)
Spoke with the patient this morning and she is going to call her insurance and give me an update.

## 2022-07-19 ENCOUNTER — Ambulatory Visit: Payer: 59 | Admitting: Allergy and Immunology

## 2022-07-24 ENCOUNTER — Other Ambulatory Visit: Payer: Self-pay | Admitting: Family Medicine

## 2022-07-25 ENCOUNTER — Other Ambulatory Visit (HOSPITAL_COMMUNITY): Payer: Self-pay

## 2022-07-25 MED ORDER — ESCITALOPRAM OXALATE 20 MG PO TABS
20.0000 mg | ORAL_TABLET | Freq: Every day | ORAL | 0 refills | Status: DC
  Filled 2022-07-25 – 2022-07-26 (×2): qty 30, 30d supply, fill #0

## 2022-07-26 ENCOUNTER — Other Ambulatory Visit (HOSPITAL_COMMUNITY): Payer: Self-pay

## 2022-07-26 ENCOUNTER — Other Ambulatory Visit: Payer: Self-pay

## 2022-08-18 ENCOUNTER — Telehealth: Payer: Self-pay

## 2022-08-18 ENCOUNTER — Encounter: Payer: Self-pay | Admitting: Emergency Medicine

## 2022-08-18 ENCOUNTER — Other Ambulatory Visit (HOSPITAL_COMMUNITY): Payer: Self-pay

## 2022-08-18 ENCOUNTER — Ambulatory Visit
Admission: EM | Admit: 2022-08-18 | Discharge: 2022-08-18 | Disposition: A | Discharge status: Home / Self Care | Payer: Commercial Managed Care - PPO | Attending: Family Medicine | Admitting: Family Medicine

## 2022-08-18 DIAGNOSIS — J4541 Moderate persistent asthma with (acute) exacerbation: Secondary | ICD-10-CM | POA: Diagnosis not present

## 2022-08-18 MED ORDER — IPRATROPIUM-ALBUTEROL 0.5-2.5 (3) MG/3ML IN SOLN
3.0000 mL | Freq: Once | RESPIRATORY_TRACT | Status: AC
  Administered 2022-08-18: 3 mL via RESPIRATORY_TRACT

## 2022-08-18 MED ORDER — PROMETHAZINE-DM 6.25-15 MG/5ML PO SYRP: 5.0000 mL | ORAL_SOLUTION | Freq: Four times a day (QID) | ORAL | 0 refills | Status: DC | PRN

## 2022-08-18 MED ORDER — PREDNISONE 20 MG PO TABS: 40.0000 mg | ORAL_TABLET | Freq: Every day | ORAL | 0 refills | Status: AC

## 2022-08-18 MED ORDER — METHYLPREDNISOLONE ACETATE 80 MG/ML IJ SUSP
80.0000 mg | Freq: Once | INTRAMUSCULAR | Status: AC
  Administered 2022-08-18: 80 mg via INTRAMUSCULAR

## 2022-08-18 NOTE — Telephone Encounter (Signed)
Please tell her that I am sorry that I am just now seeing this. She needs to go to urgent care or her primary care if we do not have any opening this evening

## 2022-08-18 NOTE — ED Triage Notes (Addendum)
Patient presents to Urgent Care with complaints of coughing spells since 1 week ago. Patient reports when she is having a coughing spell, will become difficult to breathe. She did contact her allergist and advised to go to urgent care. Can feel some mucus in back of throat. Water does help with coughing spells. Promethazine cough syrup didn't help, coughing suppressants. Does regurgitate with coughing spells. Inhaler doesn't help

## 2022-08-18 NOTE — Telephone Encounter (Signed)
Called patient - DOB verified - advised provider notation below.  Patient verbalized understanding, no further questions.

## 2022-08-18 NOTE — Telephone Encounter (Signed)
Patient called in - DOB/Pharmacy verified - stated she has been having a wet cough that started a week ago that she can not get rid of. Patient stated she has tried the following with no relief: Mucinex Dm and Promethazine DM. Patient stated she no other symptoms.  Patient advised message would be forwarded to provider for next step.  Patient is available for televisit or coming in to be seen if needed.

## 2022-08-18 NOTE — Discharge Instructions (Signed)
You have been given a shot of methylprednisolone 80 mg.  You are also given a nebulizer treatment with albuterol and ipratropium  Take prednisone 20 mg--2 daily for 5 days  Take Phenergan with dextromethorphan syrup--5 mL or 1 teaspoon every 6 hours as needed for cough  If you worsen in any way or not improving, please consider going to the emergency room for further evaluation and treatment.
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
## 2022-08-23 ENCOUNTER — Other Ambulatory Visit (HOSPITAL_COMMUNITY): Payer: Self-pay

## 2022-08-23 ENCOUNTER — Encounter: Payer: Self-pay | Admitting: Allergy and Immunology

## 2022-08-23 ENCOUNTER — Ambulatory Visit: Payer: Commercial Managed Care - PPO | Admitting: Allergy and Immunology

## 2022-08-23 ENCOUNTER — Other Ambulatory Visit: Payer: Self-pay

## 2022-08-23 VITALS — BP 122/78 | HR 87 | Temp 98.6°F | Resp 14

## 2022-08-23 DIAGNOSIS — J4540 Moderate persistent asthma, uncomplicated: Secondary | ICD-10-CM | POA: Diagnosis not present

## 2022-08-23 DIAGNOSIS — K219 Gastro-esophageal reflux disease without esophagitis: Secondary | ICD-10-CM

## 2022-08-23 DIAGNOSIS — J3089 Other allergic rhinitis: Secondary | ICD-10-CM | POA: Diagnosis not present

## 2022-08-23 MED ORDER — FAMOTIDINE 40 MG PO TABS
40.0000 mg | ORAL_TABLET | Freq: Every evening | ORAL | 1 refills | Status: DC
  Filled 2022-08-23: qty 90, 90d supply, fill #0

## 2022-08-23 MED ORDER — FLUTICASONE-SALMETEROL 250-50 MCG/ACT IN AEPB
1.0000 | INHALATION_SPRAY | Freq: Two times a day (BID) | RESPIRATORY_TRACT | 5 refills | Status: DC
  Filled 2022-08-23 – 2022-11-07 (×2): qty 60, 30d supply, fill #0

## 2022-08-23 MED ORDER — BECLOMETHASONE DIPROPIONATE 80 MCG/ACT NA AERS
1.0000 | INHALATION_SPRAY | Freq: Every day | NASAL | 5 refills | Status: DC
  Filled 2022-08-23: qty 10.6, 30d supply, fill #0

## 2022-08-23 MED ORDER — OMEPRAZOLE 40 MG PO CPDR
40.0000 mg | DELAYED_RELEASE_CAPSULE | Freq: Every morning | ORAL | 1 refills | Status: DC
  Filled 2022-08-23: qty 90, 90d supply, fill #0

## 2022-08-23 MED ORDER — LEVOCETIRIZINE DIHYDROCHLORIDE 5 MG PO TABS
5.0000 mg | ORAL_TABLET | Freq: Every day | ORAL | 5 refills | Status: DC | PRN
  Filled 2022-08-23: qty 60, 60d supply, fill #0

## 2022-08-23 NOTE — Progress Notes (Unsigned)
[facility] [facility] [place] [facility] [place] [facility] [place] [facility] [name]   Follow-up Note  Referring Provider: Willow Ora, MD Primary Provider: Willow Ora, MD Date of Office Visit: 08/23/2022  Subjective:   Jennifer Dean (DOB: [birth year]) is a 42 y.o. female who [facility] in re-evaluation of the following:  HPI: Jennifer Dean returns to this clinic in evaluation of asthma and allergic rhinitis.  I have not seen her in this clinic since 11 January 2022.  She apparently was having problems with coughing and what sounds like laryngeal spasm and she visited with our nurse practitioner on 05 July 2022 and had her Wixela increased and used a combination of omeprazole and Pepcid consistently and her coughing got better but then 3 weeks ago she developed a very bad cough with unrelenting coughing for which she went to the urgent care center and was treated with a steroid shot and prednisone.  Now she is much better but she still has an irritation in her throat.  Her coughing has decreased dramatically and she no longer needs to use the short acting bronchodilator.  Allergies as of 08/23/2022       Reactions   Morphine And Codeine Nausea And Vomiting   Codeine Anxiety   Dry mouth, uneasy feeling        Medication List    albuterol 108 (90 Base) MCG/ACT inhaler Commonly known as: VENTOLIN HFA Inhale 2 puffs into the lungs every 4 to 6 hours as needed for wheezing or shortness of breath   Beclomethasone Dipropionate 80 MCG/ACT [place] 1-2 sprays into both nostrils daily. What changed: how much to take   escitalopram 20 MG tablet Commonly known as: LEXAPRO Take 1 tablet (20 mg total) by mouth daily.   famotidine 40 MG tablet Commonly known as: PEPCID Take 1 tablet (40 mg total) by mouth at bedtime.   Fluarix Quadrivalent 0.5 ML injection Generic drug: influenza vac split quadrivalent PF Inject into the muscle.    fluticasone-salmeterol 250-50 MCG/ACT Aepb Commonly known as: Advair Diskus Inhale 1 puff into the lungs 2 (two) times daily.   fluticasone-salmeterol 250-50 MCG/ACT Aepb Commonly known as: Wixela Inhub Inhale 1 puff into the lungs in the morning and at bedtime.   lamoTRIgine 100 MG tablet Commonly known as: LaMICtal Take 1 tablet (100 mg total) by mouth daily.   levocetirizine 5 MG tablet Commonly known as: Xyzal Allergy 24HR Take 1 tablet (5 mg total) by mouth daily as needed for allergies (Can take an extra dose during flare ups.).   Nexplanon 68 MG Impl implant Generic drug: etonogestrel 1 each by Subdermal route once.   omeprazole 40 MG capsule Commonly known as: PRILOSEC Take 1 capsule (40 mg total) by mouth in the morning.   promethazine-dextromethorphan 6.25-15 MG/5ML syrup Commonly known as: PROMETHAZINE-DM Take 5 mLs by mouth 4 (four) times daily as needed for cough.    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   HENT: Negative.    Eyes: Negative.   Respiratory: Negative.    Cardiovascular: Negative.   Gastrointestinal: Negative.   Genitourinary: Negative.   Musculoskeletal: Negative.   Skin: Negative.   Neurological: Negative.   Endo/Heme/Allergies: Negative.   Psychiatric/Behavioral: Negative.       Objective:   Vitals:   08/23/22 1404  BP: 122/78  Pulse: 87  Resp: 14  Temp: 98.6 F (37 C)  SpO2: 97%          Physical  Exam Constitutional:      Appearance: She is not diaphoretic.  HENT:     Head: Normocephalic.     Right Ear: Tympanic membrane, ear canal and external ear normal.     Left Ear: Tympanic membrane, ear canal and external ear normal.     Nose: Nose normal. No mucosal edema or rhinorrhea.     Mouth/Throat:     Pharynx: Uvula midline. No oropharyngeal exudate.  Eyes:     Conjunctiva/sclera: Conjunctivae normal.  Neck:     Thyroid: No thyromegaly.     Trachea: Trachea normal. No tracheal tenderness or tracheal deviation.  Cardiovascular:     Rate and Rhythm: Normal rate and regular rhythm.     Heart sounds: Normal heart sounds, S1 normal and S2 normal. No murmur heard. Pulmonary:     Effort: No respiratory distress.     Breath sounds: Normal breath sounds. No stridor. No wheezing or rales.  Lymphadenopathy:     Head:     Right side of head: No tonsillar adenopathy.     Left side of head: No tonsillar adenopathy.     Cervical: No cervical adenopathy.  Skin:    Findings: No erythema or rash.     Nails: There is no clubbing.  Neurological:     Mental Status: She is alert.     Diagnostics:    Spirometry was performed and demonstrated an FEV1 of 2.12 at 88 % of predicted.  The patient had an Asthma Control Test with the following results: ACT Total Score: 11.    Assessment and Plan:   1. Not well controlled moderate persistent asthma   2. Perennial allergic rhinitis   3. LPRD (laryngopharyngeal reflux disease)    1.  Continue to treat and prevent inflammation:  A. Wixela 250 -1 inhalation 2 times per day  B. Qnasl 80 -1-2 puffs each nostril 1 time per day   C. Tezepelumab injections???  2. Treat and prevent reflux / LPR:   A. Omeprazole 40 mg - 1 tablet in AM  B. Famotidine 40 mg- 1 tablet at night  C. Minimize caffeine and chocolate consumption  D. Replace throat clearing with drinking/swallowing maneuver  E. Evaluation with ENT???  3.  If needed:   A.  Albuterol  HFA -2 inhalations every 4-6 hours.   B.  OTC nasal saline  C.  OTC antihistamine  4. Blood - area 2 aeroallergen profile, CBC w/d  5.  Return to clinic in 12 weeks or earlier if problem  Jennifer Dean appears to have some issues with inflammation of her airway and she also appears to have an issue with LPR and that combination was giving rise to significant laryngeal spasm earlier this year.  Regarding her inflammation, if she does not do well with her current plan of Wixela and Qnasl we will consider starting her on anti-TSLP  antibody.  Regarding her LPR, if we can get this under good control with the plan noted above then we will have her be seen by ENT.  Will further investigate her atopic disease and eligibility for a biologic agent with the blood test noted above.  Laurette Schimke, MD Allergy / Immunology [facility]

## 2022-08-23 NOTE — Patient Instructions (Addendum)
  1.  Continue to treat and prevent inflammation:  A. Wixela 250 -1 inhalation 2 times per day  B. Qnasl 80 -1-2 puffs each nostril 1 time per day   C. Tezepelumab injections???  2. Treat and prevent reflux / LPR:   A. Omeprazole 40 mg - 1 tablet in AM  B. Famotidine 40 mg- 1 tablet at night  C. Minimize caffeine and chocolate consumption  D. Replace throat clearing with drinking/swallowing maneuver  E. Evaluation with ENT???  3.  If needed:   A.  Albuterol HFA -2 inhalations every 4-6 hours.  B.  OTC nasal saline  C.  OTC antihistamine  4. Blood - area 2 aeroallergen profile, CBC w/d  5.  Return to clinic in 12 weeks or earlier if problem

## 2022-08-24 ENCOUNTER — Encounter: Payer: Self-pay | Admitting: Allergy and Immunology

## 2022-08-24 LAB — ALLERGENS W/TOTAL IGE AREA 2

## 2022-08-24 LAB — CBC WITH DIFFERENTIAL
Basophils Absolute: 0.1 10*3/uL (ref 0.0–0.2)
Eos: 0 %
Hematocrit: 39.9 % (ref 34.0–46.6)
Immature Granulocytes: 1 %
Monocytes: 8 %
RDW: 11.8 % (ref 11.7–15.4)
WBC: 15.4 10*3/uL — ABNORMAL HIGH (ref 3.4–10.8)

## 2022-08-26 LAB — CBC WITH DIFFERENTIAL
Basos: 0 %
EOS (ABSOLUTE): 0 10*3/uL (ref 0.0–0.4)
Hemoglobin: 12.9 g/dL (ref 11.1–15.9)
Immature Grans (Abs): 0.1 10*3/uL (ref 0.0–0.1)
Lymphocytes Absolute: 3.9 10*3/uL — ABNORMAL HIGH (ref 0.7–3.1)
Lymphs: 25 %
MCH: 29.7 pg (ref 26.6–33.0)
MCHC: 32.3 g/dL (ref 31.5–35.7)
MCV: 92 fL (ref 79–97)
Monocytes Absolute: 1.2 10*3/uL — ABNORMAL HIGH (ref 0.1–0.9)
Neutrophils Absolute: 10.2 10*3/uL — ABNORMAL HIGH (ref 1.4–7.0)
Neutrophils: 66 %
RBC: 4.35 x10E6/uL (ref 3.77–5.28)

## 2022-08-26 LAB — ALLERGENS W/TOTAL IGE AREA 2
Alternaria Alternata IgE: <0.1 kU/L
Bermuda Grass IgE: <0.1 kU/L
Cat Dander IgE: <0.1 kU/L
Cedar, Mountain IgE: <0.1 kU/L
Cladosporium Herbarum IgE: 0.18 kU/L — AB
Cockroach, German IgE: <0.1 kU/L
Common Silver Birch IgE: <0.1 kU/L
D Farinae IgE: <0.1 kU/L
D Pteronyssinus IgE: <0.1 kU/L
Elm, American IgE: <0.1 kU/L
IgE (Immunoglobulin E), Serum: 97 IU/mL (ref 6–495)
Maple/Box Elder IgE: <0.1 kU/L
Mouse Urine IgE: <0.1 kU/L
Penicillium Chrysogen IgE: <0.1 kU/L
Ragweed, Short IgE: <0.1 kU/L
Sheep Sorrel IgE Qn: <0.1 kU/L

## 2022-09-07 ENCOUNTER — Other Ambulatory Visit (HOSPITAL_COMMUNITY): Payer: Self-pay

## 2022-09-08 ENCOUNTER — Other Ambulatory Visit (HOSPITAL_COMMUNITY): Payer: Self-pay

## 2022-09-13 ENCOUNTER — Other Ambulatory Visit (HOSPITAL_COMMUNITY): Payer: Self-pay

## 2022-09-13 NOTE — Addendum Note (Signed)
Addended by: Elsworth Soho on: 09/13/2022 04:47 PM   Modules accepted: Orders

## 2022-09-15 ENCOUNTER — Other Ambulatory Visit (HOSPITAL_COMMUNITY): Payer: Self-pay

## 2022-09-15 ENCOUNTER — Ambulatory Visit (HOSPITAL_BASED_OUTPATIENT_CLINIC_OR_DEPARTMENT_OTHER)
Admission: RE | Admit: 2022-09-15 | Discharge: 2022-09-15 | Disposition: A | Discharge status: Home / Self Care | Payer: Commercial Managed Care - PPO | Source: Ambulatory Visit | Attending: Allergy and Immunology | Admitting: Allergy and Immunology

## 2022-09-15 ENCOUNTER — Other Ambulatory Visit: Payer: Self-pay | Admitting: Family Medicine

## 2022-09-15 DIAGNOSIS — J4540 Moderate persistent asthma, uncomplicated: Secondary | ICD-10-CM | POA: Diagnosis not present

## 2022-09-15 DIAGNOSIS — R059 Cough, unspecified: Secondary | ICD-10-CM | POA: Diagnosis not present

## 2022-09-16 ENCOUNTER — Other Ambulatory Visit (HOSPITAL_COMMUNITY): Payer: Self-pay

## 2022-09-16 ENCOUNTER — Telehealth: Payer: Self-pay | Admitting: *Deleted

## 2022-09-16 NOTE — Telephone Encounter (Signed)
-----   Message from Elsworth Soho, CMA sent at 09/13/2022 12:09 PM EDT ----- I called patient and informed of results. Patient wanted to initiate the approval process for [employer] and also requested information of it. I verified mailing address with patient. Information on Dorothea Ogle has been placed to be mailed out. Patient wanted me to let Dr.Kozlow know that cough is better but has not stopped even though she is using the inhalers and has been taking promethazine. Patient said that cough is bad to the point where she is almost throwing up. She is asking if the cough is caused by Asthma and Allergies and if needs any further testing for it. I informed patient I would send note to provider.

## 2022-09-16 NOTE — Telephone Encounter (Signed)
Patient called stating she just found out med refill request was denied due to needing an OV. Patient has made an OV for 09/21/22 for medication refill; this was earliest she was available. Due to the nature of the medication and her not having but one pill of both left, can this be sent in to pharmacy prior to OV? Please Advise.

## 2022-09-16 NOTE — Telephone Encounter (Signed)
L/m for patient to contact me to advise approval, copay card and submit to [facility] for [employer]

## 2022-09-19 ENCOUNTER — Other Ambulatory Visit: Payer: Self-pay | Admitting: Family Medicine

## 2022-09-19 ENCOUNTER — Other Ambulatory Visit (HOSPITAL_COMMUNITY): Payer: Self-pay

## 2022-09-19 DIAGNOSIS — F4310 Post-traumatic stress disorder, unspecified: Secondary | ICD-10-CM | POA: Diagnosis not present

## 2022-09-19 MED ORDER — LAMOTRIGINE 100 MG PO TABS
100.0000 mg | ORAL_TABLET | Freq: Every day | ORAL | 0 refills | Status: DC
  Filled 2022-09-19: qty 30, 30d supply, fill #0

## 2022-09-19 MED ORDER — ESCITALOPRAM OXALATE 20 MG PO TABS
20.0000 mg | ORAL_TABLET | Freq: Every day | ORAL | 0 refills | Status: DC
  Filled 2022-09-19: qty 30, 30d supply, fill #0

## 2022-09-21 ENCOUNTER — Ambulatory Visit: Payer: Commercial Managed Care - PPO | Admitting: Family Medicine

## 2022-09-22 ENCOUNTER — Encounter: Payer: Self-pay | Admitting: Family Medicine

## 2022-09-22 ENCOUNTER — Other Ambulatory Visit (HOSPITAL_COMMUNITY): Payer: Self-pay

## 2022-09-22 ENCOUNTER — Ambulatory Visit: Payer: Commercial Managed Care - PPO | Admitting: Family Medicine

## 2022-09-22 VITALS — BP 108/84 | HR 73 | Temp 98.0°F | Ht 62.0 in | Wt 221.4 lb

## 2022-09-22 DIAGNOSIS — J3089 Other allergic rhinitis: Secondary | ICD-10-CM

## 2022-09-22 DIAGNOSIS — F329 Major depressive disorder, single episode, unspecified: Secondary | ICD-10-CM | POA: Diagnosis not present

## 2022-09-22 DIAGNOSIS — F4321 Adjustment disorder with depressed mood: Secondary | ICD-10-CM | POA: Diagnosis not present

## 2022-09-22 MED ORDER — ESCITALOPRAM OXALATE 20 MG PO TABS
20.0000 mg | ORAL_TABLET | Freq: Every day | ORAL | 3 refills | Status: DC
  Filled 2022-09-22: qty 90, 90d supply, fill #0
  Filled 2023-03-14: qty 90, 90d supply, fill #1
  Filled 2023-09-21: qty 90, 90d supply, fill #2

## 2022-09-22 MED ORDER — LEVOCETIRIZINE DIHYDROCHLORIDE 5 MG PO TABS
5.0000 mg | ORAL_TABLET | Freq: Every evening | ORAL | 3 refills | Status: DC
  Filled 2022-09-22: qty 90, 90d supply, fill #0
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
## 2022-09-23 ENCOUNTER — Other Ambulatory Visit (HOSPITAL_COMMUNITY): Payer: Self-pay

## 2022-09-27 NOTE — Telephone Encounter (Signed)
L/m for patient to reach out to me again

## 2022-09-29 ENCOUNTER — Other Ambulatory Visit (HOSPITAL_COMMUNITY): Payer: Self-pay

## 2022-09-29 ENCOUNTER — Other Ambulatory Visit: Payer: Self-pay | Admitting: Family Medicine

## 2022-09-29 ENCOUNTER — Encounter: Payer: Self-pay | Admitting: Allergy and Immunology

## 2022-09-29 MED ORDER — LAMOTRIGINE 100 MG PO TABS
100.0000 mg | ORAL_TABLET | Freq: Every day | ORAL | 0 refills | Status: DC
  Filled 2022-09-29: qty 30, 30d supply, fill #0

## 2022-09-30 ENCOUNTER — Other Ambulatory Visit (HOSPITAL_COMMUNITY): Payer: Self-pay

## 2022-10-05 DIAGNOSIS — F4310 Post-traumatic stress disorder, unspecified: Secondary | ICD-10-CM | POA: Diagnosis not present

## 2022-10-11 NOTE — Telephone Encounter (Signed)
L/m for patient again to contact me ?

## 2022-10-20 ENCOUNTER — Telehealth: Payer: Commercial Managed Care - PPO | Admitting: Physician Assistant

## 2022-10-20 ENCOUNTER — Encounter: Payer: Self-pay | Admitting: Allergy and Immunology

## 2022-10-20 ENCOUNTER — Other Ambulatory Visit (HOSPITAL_COMMUNITY): Payer: Self-pay

## 2022-10-20 DIAGNOSIS — U071 COVID-19: Secondary | ICD-10-CM

## 2022-10-20 MED ORDER — MOLNUPIRAVIR EUA 200MG CAPSULE
4.0000 | ORAL_CAPSULE | Freq: Two times a day (BID) | ORAL | 0 refills | Status: AC
Start: 2022-10-20 — End: 2022-10-25
  Filled 2022-10-20: qty 40, 5d supply, fill #0
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
## 2022-10-20 NOTE — Patient Instructions (Signed)
Jennifer Dean, thank you for joining Margaretann Loveless, PA-C for today's virtual visit.  While this provider is not your primary care provider (PCP), if your PCP is located in our provider database this encounter information will be shared with them immediately following your visit.   A [facility] MyChart account gives you access to today's visit and all your visits, tests, and labs performed at [facility] " click here if you don't have a [facility] MyChart account or go to [email or web address]  Consent: (Patient) Shakala P Karrer provided verbal consent for this virtual visit at the beginning of the encounter.  Current Medications:  Current Outpatient Medications:    molnupiravir EUA (LAGEVRIO) 200 mg CAPS capsule, Take 4 capsules (800 mg total) by mouth 2 (two) times daily for 5 days., Disp: 40 capsule, Rfl: 0   albuterol (VENTOLIN HFA) 108 (90 Base) MCG/ACT inhaler, Inhale 2 puffs into the lungs every 4 to 6 hours as needed for wheezing or shortness of breath, Disp: 6.7 g, Rfl: 1   Beclomethasone Dipropionate 80 MCG/ACT AERS, Place 1-2 sprays into both nostrils daily. (Patient not taking: Reported on 09/22/2022), Disp: 10.6 g, Rfl: 5   escitalopram (LEXAPRO) 20 MG tablet, Take 1 tablet (20 mg total) by mouth daily., Disp: 90 tablet, Rfl: 3   etonogestrel (NEXPLANON) 68 MG IMPL implant, 1 each by Subdermal route once., Disp: , Rfl:    famotidine (PEPCID) 40 MG tablet, Take 1 tablet (40 mg total) by mouth at bedtime., Disp: 90 tablet, Rfl: 1   fluticasone-salmeterol (WIXELA INHUB) 250-50 MCG/ACT AEPB, Inhale 1 puff into the lungs in the morning and at bedtime., Disp: 60 each, Rfl: 5   influenza vac split quadrivalent PF (FLUARIX) 0.5 ML injection, Inject into the muscle., Disp: 0.5 mL, Rfl: 0   lamoTRIgine (LAMICTAL) 100 MG tablet, Take 1 tablet (100 mg total) by mouth daily., Disp: 30 tablet, Rfl: 0   levocetirizine (XYZAL ALLERGY 24HR) 5 MG tablet, Take 1 tablet (5  mg total) by mouth every evening., Disp: 90 tablet, Rfl: 3   omeprazole (PRILOSEC) 40 MG capsule, Take 1 capsule (40 mg total) by mouth in the morning., Disp: 90 capsule, Rfl: 1   promethazine-dextromethorphan (PROMETHAZINE-DM) 6.25-15 MG/5ML syrup, Take 5 mLs by mouth 4 (four) times daily as needed for cough., Disp: 118 mL, Rfl: 0   Medications ordered in this encounter:  Meds ordered this encounter  Medications   molnupiravir EUA (LAGEVRIO) 200 mg CAPS capsule    Sig: Take 4 capsules (800 mg total) by mouth 2 (two) times daily for 5 days.    Dispense:  40 capsule    Refill:  0    Order Specific Question:   Supervising Provider    Answer:   Merrilee Jansky [id number]     *If you need refills on other medications prior to your next appointment, please contact your pharmacy*  Follow-Up: Call back or seek an in-person evaluation if the symptoms worsen or if the condition fails to improve as anticipated.  [facility] [phone]  Care Instructions:  Molnupiravir Capsules What is this medication? MOLNUPIRAVIR (MOL nue PIR a vir) treats mild to moderate COVID-19. It may help people who are at high risk of developing severe illness. This medication works by limiting the spread of the virus in your body. The FDA has allowed the emergency use of this medication. This medicine may be used for other purposes; ask your health care provider or pharmacist if you  have questions. COMMON BRAND NAME(S): LAGEVRIO What should I tell my care team before I take this medication? They need to know if you have any of these conditions: Any allergies Any serious illness An unusual or allergic reaction to molnupiravir, other medications, foods, dyes, or preservatives Pregnant or trying to get pregnant Breast-feeding How should I use this medication? Take this medication by mouth with water. Take it as directed on the prescription label at the same time every day. Do not cut, crush, or chew  this medication. Swallow the capsules whole. You can take it with or without food. If it upsets your stomach, take it with food. Take all of it unless your care team tells you to stop it early. Keep taking it even if you think you are better. Talk to your care team about the use of this medication in children. Special care may be needed. Overdosage: If you think you have taken too much of this medicine contact a poison control center or emergency room at once. NOTE: This medicine is only for you. Do not share this medicine with others. What if I miss a dose? If you miss a dose, take it as soon as you can unless it is more than 10 hours late. If it is more than 10 hours late, skip the missed dose. Take the next dose at the normal time. Do not take extra or 2 doses at the same time to make up for the missed dose. What may interact with this medication? Interactions have not been studied. This list may not describe all possible interactions. Give your health care provider a list of all the medicines, herbs, non-prescription drugs, or dietary supplements you use. Also tell them if you smoke, drink alcohol, or use illegal drugs. Some items may interact with your medicine. What should I watch for while using this medication? Your condition will be monitored carefully while you are receiving this medication. Visit your care team for regular checkups. Tell your care team if your symptoms do not start to get better or if they get worse. Do not become pregnant while taking this medication. You may need a pregnancy test before starting this medication. Women must use a reliable form of birth control while taking this medication and for 4 days after stopping the medication. Women should inform their care team if they wish to become pregnant or think they might be pregnant. Men should not father a child while taking this medication and for 3 months after stopping it. There is potential for serious harm to an unborn  child. Talk to your care team for more information. Do not breast-feed an infant while taking this medication and for 4 days after stopping the medication. What side effects may I notice from receiving this medication? Side effects that you should report to your care team as soon as possible: Allergic reactions--skin rash, itching, hives, swelling of the face, lips, tongue, or throat Side effects that usually do not require medical attention (report these to your care team if they continue or are bothersome): Diarrhea Dizziness Nausea This list may not describe all possible side effects. Call your doctor for medical advice about side effects. You may report side effects to FDA at 1-800-FDA-1088. Where should I keep my medication? Keep out of the reach of children and pets. Store at room temperature between 20 and 25 degrees C (68 and 77 degrees F). Get rid of any unused medication after the expiration date. To get rid of medications that  are no longer needed or have expired: Take the medication to a medication take-back program. Check with your pharmacy or law enforcement to find a location. If you cannot return the medication, check the label or package insert to see if the medication should be thrown out in the garbage or flushed down the toilet. If you are not sure, ask your care team. If it is safe to put it in the trash, take the medication out of the container. Mix the medication with cat litter, dirt, coffee grounds, or other unwanted substance. Seal the mixture in a bag or container. Put it in the trash. NOTE: This sheet is a summary. It may not cover all possible information. If you have questions about this medicine, talk to your doctor, pharmacist, or health care provider.  2024 Elsevier/Gold Standard (2021-05-17 00:00:00)    Isolation Instructions: You are to isolate at home until you have been fever free for at least 24 hours without a fever-reducing medication, and symptoms have  been steadily improving for 24 hours. At that time,  you can end isolation but need to mask for an additional 5 days.   If you must be around other household members who do not have symptoms, you need to make sure that both you and the family members are masking consistently with a high-quality mask.  If you note any worsening of symptoms despite treatment, please seek an in-person evaluation ASAP. If you note any significant shortness of breath or any chest pain, please seek ER evaluation. Please do not delay care!   COVID-19: What to Do if You Are Sick If you test positive and are an older adult or someone who is at high risk of getting very sick from COVID-19, treatment may be available. Contact a healthcare provider right away after a positive test to determine if you are eligible, even if your symptoms are mild right now. You can also visit a Test to Treat location and, if eligible, receive a prescription from a provider. Don't delay: Treatment must be started within the first few days to be effective. If you have a fever, cough, or other symptoms, you might have COVID-19. Most people have mild illness and are able to recover at home. If you are sick: Keep track of your symptoms. If you have an emergency warning sign (including trouble breathing), call 911. Steps to help prevent the spread of COVID-19 if you are sick If you are sick with COVID-19 or think you might have COVID-19, follow the steps below to care for yourself and to help protect other people in your home and community. Stay home except to get medical care Stay home. Most people with COVID-19 have mild illness and can recover at home without medical care. Do not leave your home, except to get medical care. Do not visit public areas and do not go to places where you are unable to wear a mask. Take care of yourself. Get rest and stay hydrated. Take over-the-counter medicines, such as acetaminophen, to help you feel better. Stay in  touch with your doctor. Call before you get medical care. Be sure to get care if you have trouble breathing, or have any other emergency warning signs, or if you think it is an emergency. Avoid public transportation, ride-sharing, or taxis if possible. Get tested If you have symptoms of COVID-19, get tested. While waiting for test results, stay away from others, including staying apart from those living in your household. Get tested as soon as possible  after your symptoms start. Treatments may be available for people with COVID-19 who are at risk for becoming very sick. Don't delay: Treatment must be started early to be effective--some treatments must begin within 5 days of your first symptoms. Contact your healthcare provider right away if your test result is positive to determine if you are eligible. Self-tests are one of several options for testing for the virus that causes COVID-19 and may be more convenient than laboratory-based tests and point-of-care tests. Ask your healthcare provider or your local health department if you need help interpreting your test results. You can visit your state, tribal, local, and territorial health department's website to look for the latest local information on testing sites. Separate yourself from other people As much as possible, stay in a specific room and away from other people and pets in your home. If possible, you should use a separate bathroom. If you need to be around other people or animals in or outside of the home, wear a well-fitting mask. Tell your close contacts that they may have been exposed to COVID-19. An infected person can spread COVID-19 starting 48 hours (or 2 days) before the person has any symptoms or tests positive. By letting your close contacts know they may have been exposed to COVID-19, you are helping to protect everyone. See COVID-19 and Animals if you have questions about pets. If you are diagnosed with COVID-19, someone from the  health department may call you. Answer the call to slow the spread. Monitor your symptoms Symptoms of COVID-19 include fever, cough, or other symptoms. Follow care instructions from your healthcare provider and local health department. Your local health authorities may give instructions on checking your symptoms and reporting information. When to seek emergency medical attention Look for emergency warning signs* for COVID-19. If someone is showing any of these signs, seek emergency medical care immediately: Trouble breathing Persistent pain or pressure in the chest New confusion Inability to wake or stay awake Pale, gray, or blue-colored skin, lips, or nail beds, depending on skin tone *This list is not all possible symptoms. Please call your medical provider for any other symptoms that are severe or concerning to you. Call 911 or call ahead to your local emergency facility: Notify the operator that you are seeking care for someone who has or may have COVID-19. Call ahead before visiting your doctor Call ahead. Many medical visits for routine care are being postponed or done by phone or telemedicine. If you have a medical appointment that cannot be postponed, call your doctor's office, and tell them you have or may have COVID-19. This will help the office protect themselves and other patients. If you are sick, wear a well-fitting mask You should wear a mask if you must be around other people or animals, including pets (even at home). Wear a mask with the best fit, protection, and comfort for you. You don't need to wear the mask if you are alone. If you can't put on a mask (because of trouble breathing, for example), cover your coughs and sneezes in some other way. Try to stay at least 6 feet away from other people. This will help protect the people around you. Masks should not be placed on young children under age 83 years, anyone who has trouble breathing, or anyone who is not able to remove the  mask without help. Cover your coughs and sneezes Cover your mouth and nose with a tissue when you cough or sneeze. Throw away used tissues in  a lined trash can. Immediately wash your hands with soap and water for at least 20 seconds. If soap and water are not available, clean your hands with an alcohol-based hand sanitizer that contains at least 60% alcohol. Clean your hands often Wash your hands often with soap and water for at least 20 seconds. This is especially important after blowing your nose, coughing, or sneezing; going to the bathroom; and before eating or preparing food. Use hand sanitizer if soap and water are not available. Use an alcohol-based hand sanitizer with at least 60% alcohol, covering all surfaces of your hands and rubbing them together until they feel dry. Soap and water are the best option, especially if hands are visibly dirty. Avoid touching your eyes, nose, and mouth with unwashed hands. Handwashing Tips Avoid sharing personal household items Do not share dishes, drinking glasses, cups, eating utensils, towels, or bedding with other people in your home. Wash these items thoroughly after using them with soap and water or put in the dishwasher. Clean surfaces in your home regularly Clean and disinfect high-touch surfaces (for example, doorknobs, tables, handles, light switches, and countertops) in your "sick room" and bathroom. In shared spaces, you should clean and disinfect surfaces and items after each use by the person who is ill. If you are sick and cannot clean, a caregiver or other person should only clean and disinfect the area around you (such as your bedroom and bathroom) on an as needed basis. Your caregiver/other person should wait as long as possible (at least several hours) and wear a mask before entering, cleaning, and disinfecting shared spaces that you use. Clean and disinfect areas that may have blood, stool, or body fluids on them. Use household cleaners  and disinfectants. Clean visible dirty surfaces with household cleaners containing soap or detergent. Then, use a household disinfectant. Use a product from [employer] List N: Disinfectants for Coronavirus (COVID-19). Be sure to follow the instructions on the label to ensure safe and effective use of the product. Many products recommend keeping the surface wet with a disinfectant for a certain period of time (look at "contact time" on the product label). You may also need to wear personal protective equipment, such as gloves, depending on the directions on the product label. Immediately after disinfecting, wash your hands with soap and water for 20 seconds. For completed guidance on cleaning and disinfecting your home, visit Complete Disinfection Guidance. Take steps to improve ventilation at home Improve ventilation (air flow) at home to help prevent from spreading COVID-19 to other people in your household. Clear out COVID-19 virus particles in the air by opening windows, using air filters, and turning on fans in your home. Use this interactive tool to learn how to improve air flow in your home. When you can be around others after being sick with COVID-19 Deciding when you can be around others is different for different situations. Find out when you can safely end home isolation. For any additional questions about your care, contact your healthcare provider or state or local health department. 06/23/2020 Content source: [facility] ([facility]), Division of Viral Diseases This information is not intended to replace advice given to you by your health care provider. Make sure you discuss any questions you have with your health care provider. Document Revised: 08/06/2020 Document Reviewed: 08/06/2020 Elsevier Patient Education  2022 [employer].     If you have been instructed to have an in-person evaluation today at a local Urgent Care  facility, please use  the link below. It will take you to a list of all of our available [facility], including address, phone number and hours of operation. Please do not delay care.  [facility]  If you or a family member do not have a primary care provider, use the link below to schedule a visit and establish care. When you choose a [facility] primary care physician or advanced practice provider, you gain a long-term partner in health. Find a Primary Care Provider  Learn more about [facility] in-office and virtual care options:  - Get Care Now

## 2022-10-21 ENCOUNTER — Other Ambulatory Visit (HOSPITAL_COMMUNITY): Payer: Self-pay

## 2022-10-21 DIAGNOSIS — F4310 Post-traumatic stress disorder, unspecified: Secondary | ICD-10-CM | POA: Diagnosis not present

## 2022-10-25 NOTE — Telephone Encounter (Signed)
Patient never returned calls 

## 2022-11-03 ENCOUNTER — Other Ambulatory Visit: Payer: Self-pay | Admitting: Family Medicine

## 2022-11-03 DIAGNOSIS — Z0000 Encounter for general adult medical examination without abnormal findings: Secondary | ICD-10-CM

## 2022-11-07 ENCOUNTER — Other Ambulatory Visit: Payer: Self-pay

## 2022-11-07 ENCOUNTER — Other Ambulatory Visit (HOSPITAL_COMMUNITY): Payer: Self-pay

## 2022-11-09 ENCOUNTER — Other Ambulatory Visit (HOSPITAL_COMMUNITY): Payer: Self-pay

## 2022-11-10 ENCOUNTER — Ambulatory Visit: Payer: Commercial Managed Care - PPO

## 2022-11-22 ENCOUNTER — Encounter: Payer: Self-pay | Admitting: Allergy and Immunology

## 2022-11-22 ENCOUNTER — Other Ambulatory Visit (HOSPITAL_COMMUNITY): Payer: Self-pay

## 2022-11-22 ENCOUNTER — Ambulatory Visit: Payer: Commercial Managed Care - PPO | Admitting: Allergy and Immunology

## 2022-11-22 ENCOUNTER — Other Ambulatory Visit: Payer: Self-pay

## 2022-11-22 VITALS — BP 124/72 | HR 83 | Temp 97.4°F | Resp 16 | Ht 62.5 in | Wt 218.0 lb

## 2022-11-22 DIAGNOSIS — K219 Gastro-esophageal reflux disease without esophagitis: Secondary | ICD-10-CM

## 2022-11-22 DIAGNOSIS — J301 Allergic rhinitis due to pollen: Secondary | ICD-10-CM | POA: Diagnosis not present

## 2022-11-22 DIAGNOSIS — J3089 Other allergic rhinitis: Secondary | ICD-10-CM

## 2022-11-22 DIAGNOSIS — J4540 Moderate persistent asthma, uncomplicated: Secondary | ICD-10-CM | POA: Diagnosis not present

## 2022-11-22 MED ORDER — AIRSUPRA 90-80 MCG/ACT IN AERO
2.0000 | INHALATION_SPRAY | RESPIRATORY_TRACT | 2 refills | Status: DC
  Filled 2022-11-22: qty 10.7, 30d supply, fill #0

## 2022-11-22 MED ORDER — FAMOTIDINE 40 MG PO TABS
40.0000 mg | ORAL_TABLET | Freq: Every day | ORAL | 1 refills | Status: DC
  Filled 2022-11-22: qty 90, 90d supply, fill #0

## 2022-11-22 MED ORDER — OMEPRAZOLE 40 MG PO CPDR
40.0000 mg | DELAYED_RELEASE_CAPSULE | Freq: Every morning | ORAL | 1 refills | Status: DC
  Filled 2022-11-22: qty 90, 90d supply, fill #0

## 2022-11-22 MED ORDER — BECLOMETHASONE DIPROPIONATE 80 MCG/ACT NA AERS
1.0000 | INHALATION_SPRAY | Freq: Every day | NASAL | 2 refills | Status: DC
  Filled 2022-11-22 – 2022-11-28 (×2): qty 10.6, 30d supply, fill #0
  Filled 2023-02-16: qty 10.6, 30d supply, fill #1

## 2022-11-22 MED ORDER — FLUTICASONE-SALMETEROL 250-50 MCG/ACT IN AEPB
1.0000 | INHALATION_SPRAY | Freq: Two times a day (BID) | RESPIRATORY_TRACT | 5 refills | Status: DC
  Filled 2022-11-22 – 2023-01-25 (×2): qty 60, 30d supply, fill #0

## 2022-11-22 MED ORDER — ALBUTEROL SULFATE HFA 108 (90 BASE) MCG/ACT IN AERS
2.0000 | INHALATION_SPRAY | RESPIRATORY_TRACT | 1 refills | Status: DC
  Filled 2022-11-22: qty 6.7, 25d supply, fill #0

## 2022-11-22 NOTE — Progress Notes (Unsigned)
[place] - [place] - [place] - [place] - [name]   Follow-up Note  Referring Provider: Willow Ora, MD Primary Provider: Willow Ora, MD Date of Office Visit: 11/22/2022  Subjective:   Jennifer Dean (DOB: September 17, 1980) is a 42 y.o. female who [facility] in re-evaluation of the following:  HPI: Jennifer Dean return to this clinic in evaluation of asthma and allergic rhinitis and LPR.  I last saw her in this clinic 23 Aug 2022.  She has been consistently using an anti-inflammatory plan for her atopic induced respiratory tract inflammation and she believes that it is working quite well.  Currently she is using a combination inhaler twice a day and she uses a nasal steroid once a day.  She is very satisfied with the response that she is receiving at this point in time and she rarely uses any albuterol.  She can exert herself with no problem.  She believes that her throat issue is going much better now that she is using omeprazole every day and famotidine about 50% of the evenings.  She has modified her caffeine and chocolate consumption and she is very cognizant about trying not to throat clear.  Overall she is very satisfied with this response  Allergies as of 11/22/2022       Reactions   Morphine And Codeine Nausea And Vomiting   Codeine Anxiety   Dry mouth, uneasy feeling        Medication List    albuterol 108 (90 Base) MCG/ACT inhaler Commonly known as: VENTOLIN HFA Inhale 2 puffs into the lungs every 4 to 6 hours as needed for wheezing or shortness of breath   escitalopram 20 MG tablet Commonly known as: LEXAPRO Take 1 tablet (20 mg total) by mouth daily.   famotidine 40 MG tablet Commonly known as: PEPCID Take 1 tablet (40 mg total) by mouth at bedtime.   Fluarix Quadrivalent 0.5 ML injection Generic drug: influenza vac split quadrivalent PF Inject into the muscle.   fluticasone-salmeterol 250-50 MCG/ACT  Aepb Commonly known as: Wixela Inhub Inhale 1 puff into the lungs in the morning and at bedtime.   lamoTRIgine 100 MG tablet Commonly known as: LaMICtal Take 1 tablet (100 mg total) by mouth daily.   levocetirizine 5 MG tablet Commonly known as: Xyzal Allergy 24HR Take 1 tablet (5 mg total) by mouth every evening.   Nexplanon 68 MG Impl implant Generic drug: etonogestrel 1 each by Subdermal route once.   omeprazole 40 MG capsule Commonly known as: PRILOSEC Take 1 capsule (40 mg total) by mouth in the morning.   promethazine-dextromethorphan 6.25-15 MG/5ML syrup Commonly known as: PROMETHAZINE-DM Take 5 mLs by mouth 4 (four) times daily as needed for cough.    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   HENT: Negative.    Eyes: Negative.  Respiratory: Negative.    Cardiovascular: Negative.   Gastrointestinal: Negative.   Genitourinary: Negative.   Musculoskeletal: Negative.   Skin: Negative.   Neurological: Negative.   Endo/Heme/Allergies: Negative.   Psychiatric/Behavioral: Negative.       Objective:   Vitals:   11/22/22 1358  BP: 124/72  Pulse: 83  Resp: 16  Temp: (!) 97.4 F (36.3 C)  SpO2: 96%   Height: 5' 2.5" (158.8 cm)  Weight: 218 lb (98.9 kg)   Physical Exam Constitutional:      Appearance: She is not diaphoretic.  HENT:     Head: Normocephalic.     Right Ear: Tympanic membrane, ear canal and external ear normal.     Left Ear: Tympanic  membrane, ear canal and external ear normal.     Nose: Nose normal. No mucosal edema or rhinorrhea.     Mouth/Throat:     Pharynx: Uvula midline. No oropharyngeal exudate.  Eyes:     Conjunctiva/sclera: Conjunctivae normal.  Neck:     Thyroid: No thyromegaly.     Trachea: Trachea normal. No tracheal tenderness or tracheal deviation.  Cardiovascular:     Rate and Rhythm: Normal rate and regular rhythm.     Heart sounds: Normal heart sounds, S1 normal and S2 normal. No murmur heard. Pulmonary:     Effort: No respiratory distress.     Breath sounds: Normal breath sounds. No stridor. No wheezing or rales.  Lymphadenopathy:     Head:     Right side of head: No tonsillar adenopathy.     Left side of head: No tonsillar adenopathy.     Cervical: No cervical adenopathy.  Skin:    Findings: No erythema or rash.     Nails: There is no clubbing.  Neurological:     Mental Status: She is alert.     Diagnostics:    Spirometry was performed and demonstrated an FEV1 of 2.11 at 88 % of predicted.  Results of a chest x-ray obtained 13 September 2022 identifies the following:  Cardiac silhouette is unremarkable. No pneumothorax or pleural effusion. The lungs are clear. The visualized skeletal structures are unremarkable.  Results of blood tests obtained 23 Aug 2022 identifies IgE antibodies directed against dog, grass, tree, mold, WBC 15.4, absolute eosinophil 0, absolute lymphocyte 3900, hemoglobin 12.9  Assessment and Plan:   1. Asthma, moderate persistent, well-controlled   2. Perennial allergic rhinitis   3. Seasonal allergic rhinitis due to pollen   4. LPRD (laryngopharyngeal reflux disease)    1.  Continue to treat and prevent inflammation:  A. Wixela 250 -1 inhalation 1-2 times per day  B. Qnasl 80 -1-2 puffs each nostril 1 time per day    2. Treat and prevent reflux / LPR:   A. Omeprazole 40 mg - 1 tablet in AM  B. Famotidine 40 mg- 1 tablet at night if needed  C. Minimize  caffeine and chocolate consumption  D. Replace throat clearing with drinking/swallowing maneuver  3.  If needed:   A.  AIRSUPRA - 2 inhalations every 4-6 hours. (Replaces albuterol)  B.  OTC nasal saline  C.  OTC antihistamine  4.  Allergen avoidance measures - dog, grass, tree, mold  5.  Plan for fall flu vaccine  6.  Return to clinic in 12 weeks or earlier if problem  Jennifer Dean is doing quite well at this point in time on her current therapy of anti-inflammatory medications for both her upper and lower airway and therapy directed against  reflux.  We have given her an anti-inflammatory rescue medicine to replace her albuterol.  If she does well with this plan I will see her back in this clinic in 12 weeks or earlier if there is a problem.  Jennifer Schimke, MD Allergy / Immunology [facility]

## 2022-11-22 NOTE — Patient Instructions (Addendum)
  1.  Continue to treat and prevent inflammation:  A. Wixela 250 -1 inhalation 1-2 times per day  B. Qnasl 80 -1-2 puffs each nostril 1 time per day    2. Treat and prevent reflux / LPR:   A. Omeprazole 40 mg - 1 tablet in AM  B. Famotidine 40 mg- 1 tablet at night if needed  C. Minimize caffeine and chocolate consumption  D. Replace throat clearing with drinking/swallowing maneuver  3.  If needed:   A.  AIRSUPRA - 2 inhalations every 4-6 hours. (Replaces albuterol)  B.  OTC nasal saline  C.  OTC antihistamine  4.  Allergen avoidance measures - dog, grass, tree, mold  5.  Plan for fall flu vaccine  6.  Return to clinic in 12 weeks or earlier if problem

## 2022-11-23 ENCOUNTER — Encounter: Payer: Self-pay | Admitting: Allergy and Immunology

## 2022-11-25 ENCOUNTER — Ambulatory Visit: Payer: Commercial Managed Care - PPO

## 2022-11-28 ENCOUNTER — Other Ambulatory Visit (HOSPITAL_COMMUNITY): Payer: Self-pay

## 2022-11-29 ENCOUNTER — Other Ambulatory Visit (HOSPITAL_COMMUNITY): Payer: Self-pay

## 2022-12-08 ENCOUNTER — Encounter: Payer: Commercial Managed Care - PPO | Admitting: Family Medicine

## 2022-12-14 ENCOUNTER — Other Ambulatory Visit (HOSPITAL_COMMUNITY): Payer: Self-pay

## 2022-12-29 ENCOUNTER — Other Ambulatory Visit: Payer: Self-pay | Admitting: Family Medicine

## 2022-12-30 ENCOUNTER — Other Ambulatory Visit (HOSPITAL_COMMUNITY): Payer: Self-pay

## 2022-12-30 MED ORDER — LAMOTRIGINE 100 MG PO TABS
100.0000 mg | ORAL_TABLET | Freq: Every day | ORAL | 0 refills | Status: DC
  Filled 2022-12-30: qty 30, 30d supply, fill #0

## 2023-01-11 ENCOUNTER — Encounter: Payer: Self-pay | Admitting: Family Medicine

## 2023-01-11 ENCOUNTER — Ambulatory Visit (INDEPENDENT_AMBULATORY_CARE_PROVIDER_SITE_OTHER): Payer: Commercial Managed Care - PPO | Admitting: Family Medicine

## 2023-01-11 VITALS — BP 114/76 | HR 75 | Temp 98.2°F | Ht 62.5 in | Wt 220.6 lb

## 2023-01-11 DIAGNOSIS — K219 Gastro-esophageal reflux disease without esophagitis: Secondary | ICD-10-CM | POA: Diagnosis not present

## 2023-01-11 DIAGNOSIS — Z23 Encounter for immunization: Secondary | ICD-10-CM

## 2023-01-11 DIAGNOSIS — F339 Major depressive disorder, recurrent, unspecified: Secondary | ICD-10-CM | POA: Diagnosis not present

## 2023-01-11 DIAGNOSIS — Z0001 Encounter for general adult medical examination with abnormal findings: Secondary | ICD-10-CM

## 2023-01-11 DIAGNOSIS — J4540 Moderate persistent asthma, uncomplicated: Secondary | ICD-10-CM | POA: Diagnosis not present

## 2023-01-11 DIAGNOSIS — D219 Benign neoplasm of connective and other soft tissue, unspecified: Secondary | ICD-10-CM

## 2023-01-11 DIAGNOSIS — J3089 Other allergic rhinitis: Secondary | ICD-10-CM

## 2023-01-11 DIAGNOSIS — F445 Conversion disorder with seizures or convulsions: Secondary | ICD-10-CM | POA: Diagnosis not present

## 2023-01-11 DIAGNOSIS — Z789 Other specified health status: Secondary | ICD-10-CM | POA: Diagnosis not present

## 2023-01-11 LAB — COMPREHENSIVE METABOLIC PANEL
ALT: 8 U/L (ref 0–35)
AST: 22 U/L (ref 0–37)
Albumin: 3.9 g/dL (ref 3.5–5.2)
Alkaline Phosphatase: 53 U/L (ref 39–117)
BUN: 10 mg/dL (ref 6–23)
CO2: 27 meq/L (ref 19–32)
Calcium: 9.2 mg/dL (ref 8.4–10.5)
Chloride: 103 meq/L (ref 96–112)
Creatinine, Ser: 0.74 mg/dL (ref 0.40–1.20)
GFR: 100.23 mL/min (ref >60.00)
Glucose, Bld: 84 mg/dL (ref 70–99)
Potassium: 4.9 meq/L (ref 3.5–5.1)
Sodium: 138 meq/L (ref 135–145)
Total Bilirubin: 0.4 mg/dL (ref 0.2–1.2)
Total Protein: 6.8 g/dL (ref 6.0–8.3)

## 2023-01-11 LAB — CBC WITH DIFFERENTIAL/PLATELET
Basophils Absolute: 0 10*3/uL (ref 0.0–0.1)
Basophils Relative: 0.3 % (ref 0.0–3.0)
Eosinophils Absolute: 0.2 10*3/uL (ref 0.0–0.7)
Eosinophils Relative: 4.6 % (ref 0.0–5.0)
HCT: 38.6 % (ref 36.0–46.0)
Hemoglobin: 12.4 g/dL (ref 12.0–15.0)
Lymphocytes Relative: 43.9 % (ref 12.0–46.0)
Lymphs Abs: 2 10*3/uL (ref 0.7–4.0)
MCHC: 32.2 g/dL (ref 30.0–36.0)
MCV: 95 fL (ref 78.0–100.0)
Monocytes Absolute: 0.4 10*3/uL (ref 0.1–1.0)
Monocytes Relative: 8.5 % (ref 3.0–12.0)
Neutro Abs: 2 10*3/uL (ref 1.4–7.7)
Neutrophils Relative %: 42.7 % — ABNORMAL LOW (ref 43.0–77.0)
Platelets: 280 10*3/uL (ref 150.0–400.0)
RBC: 4.06 Mil/uL (ref 3.87–5.11)
RDW: 13.4 % (ref 11.5–15.5)
WBC: 4.6 10*3/uL (ref 4.0–10.5)

## 2023-01-11 LAB — LIPID PANEL
Cholesterol: 149 mg/dL (ref 0–200)
HDL: 57.4 mg/dL (ref >39.00)
LDL Cholesterol: 83 mg/dL (ref 0–99)
NonHDL: 91.53
Total CHOL/HDL Ratio: 3
Triglycerides: 42 mg/dL (ref 0.0–149.0)
VLDL: 8.4 mg/dL (ref 0.0–40.0)

## 2023-01-11 LAB — TSH: TSH: 1.9 u[IU]/mL (ref 0.35–5.50)

## 2023-01-11 NOTE — Progress Notes (Signed)
Labs reviewed.  The 10-year ASCVD risk score (Arnett DK, et al., 2019) is: 0.2%   Values used to calculate the score:     Age: 42 years     Sex: Female     Is Non-Hispanic African American: Yes     Diabetic: No     Tobacco smoker: No     Systolic Blood Pressure: 114 mmHg     Is BP treated: No     HDL Cholesterol: 57.4 mg/dL     Total Cholesterol: 149 mg/dL
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
## 2023-01-11 NOTE — Patient Instructions (Addendum)
Please follow up as scheduled for your next visit with me: 01/13/2023 For Nexplanon removal and replacement  I will release your lab results to you on your MyChart account with further instructions. You may see the results before I do, but when I review them I will send you a message with my report or have my assistant call you if things need to be discussed. Please reply to my message with any questions. Thank you!   If you have any questions or concerns, please don't hesitate to send me a message via MyChart or call the office at [phone]. Thank you for visiting with Jennifer Dean today! It's our pleasure caring for you.

## 2023-01-12 DIAGNOSIS — H5213 Myopia, bilateral: Secondary | ICD-10-CM | POA: Diagnosis not present

## 2023-01-13 ENCOUNTER — Telehealth: Payer: Commercial Managed Care - PPO | Admitting: Family Medicine

## 2023-01-19 ENCOUNTER — Ambulatory Visit
Admission: RE | Admit: 2023-01-19 | Discharge: 2023-01-19 | Disposition: A | Discharge status: Home / Self Care | Payer: Commercial Managed Care - PPO | Source: Ambulatory Visit | Attending: Family Medicine | Admitting: Family Medicine

## 2023-01-19 DIAGNOSIS — Z1231 Encounter for screening mammogram for malignant neoplasm of breast: Secondary | ICD-10-CM | POA: Diagnosis not present

## 2023-01-19 DIAGNOSIS — Z0000 Encounter for general adult medical examination without abnormal findings: Secondary | ICD-10-CM

## 2023-01-20 ENCOUNTER — Ambulatory Visit: Payer: Commercial Managed Care - PPO | Admitting: Family Medicine

## 2023-01-20 ENCOUNTER — Telehealth: Payer: Commercial Managed Care - PPO | Admitting: Family Medicine

## 2023-01-24 ENCOUNTER — Ambulatory Visit: Payer: Commercial Managed Care - PPO | Admitting: Family Medicine

## 2023-01-24 VITALS — BP 128/82 | HR 70 | Temp 97.8°F | Ht 62.5 in | Wt 222.8 lb

## 2023-01-24 DIAGNOSIS — Z3046 Encounter for surveillance of implantable subdermal contraceptive: Secondary | ICD-10-CM

## 2023-01-24 DIAGNOSIS — Z30017 Encounter for initial prescription of implantable subdermal contraceptive: Secondary | ICD-10-CM

## 2023-01-24 MED ORDER — ETONOGESTREL 68 MG ~~LOC~~ IMPL
68.0000 mg | DRUG_IMPLANT | Freq: Once | SUBCUTANEOUS | Status: AC
Start: 2023-01-24 — End: 2023-01-24
  Administered 2023-01-24: 68 mg via SUBCUTANEOUS

## 2023-01-24 NOTE — Progress Notes (Signed)
Nexplanon Removal Procedure Note:  Indication for Removal: Abnormal bleeding / Desires pregnancy / Due for Removal / Side effects / Other Location / Side:left Pre-Procedure Diagnosis: nexplanon removal Post-Procedure Diagnosis: Nexplanon removed Informed consent: Procedure, alternate treatment options, risks, and benefits were thoroughly explained to the patient and informed consent was obtained before the procedure started. Risks of the procedure include (but are not limited to) bleeding, infection, difficulty with removal, scarring, and nerve damage. There may be bruising at the site of incision and down the arm. Equipment for procedure available.  The appropriate universal time-out was taken. The procedure was confirmed by patient and team. The position correct for the procedure.  The patient is placed in the supine position. Aseptic conditions aree maintained. The rod is located by palpation. The area is cleaned with antiseptic. 1-2cc of 1% lidocaine with epinephrine is injected just underneath the end of the implant closest to the elbow. After firmly pressing down on the end of the implant closer to the axilla a 2-3 mm incision is made with a scalpel. The rod is pushed to the incision site and grasped with a mosquito forceps and gently removed. Blunt dissection was not needed. The patient tolerated the procedure well. The 4 cm rod was removed in its entirety. The incision was dressed with a small adhesive bandage closure and a pressure dressing was applied.  An alternate plan for contraception was discussed. The patient would like to use *Nexplanon for her contraception. She was given a detailed instruction sheet about her desired method of contraception.   Nexplanon Insertion Procedure Note  PRE-OP DIAGNOSIS: Patient desires long-term, reversible contraception. POST-OP DIAGNOSIS: Same PROCEDURE: Nexplanon placement  PROCEDURE: -verbal informed consent obtained, risks discussed included:  bleeding, irregular menses, infection, pain/discomfort, cost for removal. -The appropriate universal timeout was taken and the patient's non-dominant hand was identified. -Patient was instructed to lie supine on the examination table with her non-dominant arm flexed at the elbow and externally rotated so that her hand was underneath her head (or as close as possible). -The insertion site was identified on the inner side of the non-dominant upper arm, 8 -10 cm from medial epicondyle of the humerus and 3-5 cm posterior to the sulcus (groove) between the biceps and triceps muscles. The insertion site was marked manually -The insertion site was confirmed as the correct location on the inner side of the arm. It was next to the old insertion site. -The skin was cleaned from the insertion site to the guiding mark with an antiseptic solution (Betadine / Chloraprep) and draped in a sterile fashion. -Anesthesia was achieved by injecting 2 mL of 1% lidocaine (without epinephrine) just under the skin along the planned insertion tunnel. Anesthesia confirmed. -The skin was punctured with the tip of the Nexplanon trocar needle slightly angled less than 30. The needle was inserted until the bevel (slanted opening of the tip) was just under the skin (and no further). -The applicator was then lowered to a horizontal position. While lifting the skin with the tip of the needle, the needle was inserted to its full length. Mild resistance was felt but no excessive force was used. The needle slid in easily. -The applicator was kept in the same position with the needle inserted to its full length. The free hand was used to keep the applicator in the same position. Then the purple slider was unlocked by pushing it slightly down. The purple slider was then moved fully back until it stopped, delivering the Nexplanon capsule subcutaneously.  The trocar was removed from the insertion site. -Nexplanon capsule was palpated by provider  and patient to assure satisfactory placement. -A Bandage and a pressure pressing were applied to the area. Patient to keep the pressure dressing for 24 hrs and the bandage for 3-5 days. -Anticipatory guidance, as well as standard post-procedure care, was explained. -Return precautions are given. The patient tolerated the procedure well without complications. -Estimated blood loss was less than 0.5 mL Follow-up: 4-6 weeks, at which time the placement will be checked.

## 2023-01-24 NOTE — Patient Instructions (Signed)
Please follow up if symptoms do not improve or as needed.    Nexplanon Instructions After Insertion  Keep bandage clean and dry for 24 hours  May use ice/Tylenol/Ibuprofen for soreness or pain  If you develop fever, drainage or increased warmth from incision site-contact office immediately Etonogestrel Implant What is this medication? ETONOGESTREL (et oh noe JES trel) prevents ovulation and pregnancy. It belongs to a group of medications called contraceptives. This medication is a progestin hormone. This medicine may be used for other purposes; ask your health care provider or pharmacist if you have questions. COMMON BRAND NAME(S): Implanon, Nexplanon What should I tell my care team before I take this medication? They need to know if you have any of these conditions: Abnormal vaginal bleeding Blood clots Blood vessel disease Breast, cervical, endometrial, ovarian, liver, or uterine cancer Diabetes Gallbladder disease Heart disease or recent heart attack High blood pressure High cholesterol or triglycerides Kidney disease Liver disease Migraine headaches Seizures Stroke Tobacco use An unusual or allergic reaction to etonogestrel, other medications, foods, dyes, or preservatives Pregnant or trying to get pregnant Breastfeeding How should I use this medication? This device is inserted just under the skin on the inner side of your upper arm by your care team. Talk to your care team about the use of this medication in children. Special care may be needed. Overdosage: If you think you have taken too much of this medicine contact a poison control center or emergency room at once. NOTE: This medicine is only for you. Do not share this medicine with others. What if I miss a dose? This does not apply. What may interact with this medication? Do not take this medication with any of the following: Amprenavir Fosamprenavir This medication may also interact with the  following: Acitretin Aprepitant Armodafinil Bexarotene Bosentan Carbamazepine Certain antivirals for HIV or hepatitis Certain medications for fungal infections, such as fluconazole, ketoconazole, itraconazole, or voriconazole Cyclosporine Felbamate Griseofulvin Lamotrigine Modafinil Oxcarbazepine Phenobarbital Phenytoin Primidone Rifabutin Rifampin Rifapentine St. John's wort Topiramate This list may not describe all possible interactions. Give your health care provider a list of all the medicines, herbs, non-prescription drugs, or dietary supplements you use. Also tell them if you smoke, drink alcohol, or use illegal drugs. Some items may interact with your medicine. What should I watch for while using this medication? Visit your care team for regular checks on your progress. Using this medication does not protect you or your partner against HIV or other sexually transmitted infections (STIs). You should be able to feel the implant by pressing your fingertips over the skin where it was inserted. Contact your care team if you cannot feel the implant, and use a non-hormonal birth control method (such as condoms) until your care team confirms that the implant is in place. Contact your care team if you think that the implant may have broken or become bent while in your arm. You will receive a user card from your care team after the implant is inserted. The card is a record of the location of the implant in your upper arm and when it should be removed. Keep this card with your health records. What side effects may I notice from receiving this medication? Side effects that you should report to your care team as soon as possible: Allergic reactions--skin rash, itching, hives, swelling of the face, lips, tongue, or throat Blood clot--pain, swelling, or warmth in the leg, shortness of breath, chest pain Gallbladder problems--severe stomach pain, nausea, vomiting, fever Increase  in blood  pressure Liver injury--right upper belly pain, loss of appetite, nausea, light-colored stool, dark yellow or brown urine, yellowing skin or eyes, unusual weakness or fatigue New or worsening migraines or headaches Pain, redness, or irritation at injection site Stroke--sudden numbness or weakness of the face, arm, or leg, trouble speaking, confusion, trouble walking, loss of balance or coordination, dizziness, severe headache, change in vision Unusual vaginal discharge, itching, or odor Worsening mood, feelings of depression Side effects that usually do not require medical attention (report to your care team if they continue or are bothersome): Breast pain or tenderness Dark patches of skin on the face or other sun-exposed areas Irregular menstrual cycles or spotting Nausea Weight gain This list may not describe all possible side effects. Call your doctor for medical advice about side effects. You may report side effects to FDA at 1-800-FDA-1088. Where should I keep my medication? This medication is given in a hospital or clinic and will not be stored at home. NOTE: This sheet is a summary. It may not cover all possible information. If you have questions about this medicine, talk to your doctor, pharmacist, or health care provider.  2024 Elsevier/Gold Standard (2021-10-26 00:00:00)

## 2023-01-26 ENCOUNTER — Other Ambulatory Visit (HOSPITAL_COMMUNITY): Payer: Self-pay

## 2023-02-17 ENCOUNTER — Other Ambulatory Visit: Payer: Self-pay

## 2023-02-21 ENCOUNTER — Encounter: Payer: Self-pay | Admitting: Allergy and Immunology

## 2023-02-21 ENCOUNTER — Other Ambulatory Visit: Payer: Self-pay

## 2023-02-21 ENCOUNTER — Other Ambulatory Visit (HOSPITAL_COMMUNITY): Payer: Self-pay

## 2023-02-21 ENCOUNTER — Ambulatory Visit: Payer: Commercial Managed Care - PPO | Admitting: Allergy and Immunology

## 2023-02-21 VITALS — BP 128/86 | HR 94 | Temp 98.0°F | Resp 16 | Ht 62.3 in | Wt 217.8 lb

## 2023-02-21 DIAGNOSIS — J4540 Moderate persistent asthma, uncomplicated: Secondary | ICD-10-CM

## 2023-02-21 DIAGNOSIS — J301 Allergic rhinitis due to pollen: Secondary | ICD-10-CM | POA: Diagnosis not present

## 2023-02-21 DIAGNOSIS — E66812 Obesity, class 2: Secondary | ICD-10-CM

## 2023-02-21 DIAGNOSIS — J3089 Other allergic rhinitis: Secondary | ICD-10-CM | POA: Diagnosis not present

## 2023-02-21 DIAGNOSIS — K219 Gastro-esophageal reflux disease without esophagitis: Secondary | ICD-10-CM

## 2023-02-21 DIAGNOSIS — Z6839 Body mass index (BMI) 39.0-39.9, adult: Secondary | ICD-10-CM

## 2023-02-21 MED ORDER — OMEPRAZOLE 40 MG PO CPDR
40.0000 mg | DELAYED_RELEASE_CAPSULE | Freq: Every morning | ORAL | 1 refills | Status: DC
  Filled 2023-02-21 – 2023-04-21 (×2): qty 90, 90d supply, fill #0

## 2023-02-21 MED ORDER — BECLOMETHASONE DIPROPIONATE 80 MCG/ACT NA AERS
2.0000 | INHALATION_SPRAY | Freq: Every day | NASAL | 1 refills | Status: DC
  Filled 2023-02-21: qty 31.8, fill #0
  Filled 2023-05-19: qty 10.6, 30d supply, fill #0

## 2023-02-21 MED ORDER — FAMOTIDINE 40 MG PO TABS
40.0000 mg | ORAL_TABLET | Freq: Every day | ORAL | 1 refills | Status: DC
  Filled 2023-02-21: qty 90, 90d supply, fill #0

## 2023-02-21 MED ORDER — ALBUTEROL SULFATE HFA 108 (90 BASE) MCG/ACT IN AERS
2.0000 | INHALATION_SPRAY | RESPIRATORY_TRACT | 1 refills | Status: AC
  Filled 2023-02-21: qty 6.7, 25d supply, fill #0

## 2023-02-21 MED ORDER — FLUTICASONE-SALMETEROL 250-50 MCG/ACT IN AEPB
1.0000 | INHALATION_SPRAY | Freq: Two times a day (BID) | RESPIRATORY_TRACT | 1 refills | Status: DC
  Filled 2023-02-21 – 2023-04-21 (×2): qty 180, 90d supply, fill #0

## 2023-02-21 MED ORDER — LEVOCETIRIZINE DIHYDROCHLORIDE 5 MG PO TABS
5.0000 mg | ORAL_TABLET | Freq: Every day | ORAL | 1 refills | Status: DC | PRN
  Filled 2023-02-21: qty 90, 90d supply, fill #0
  Filled 2023-05-12: qty 180, 90d supply, fill #0

## 2023-02-21 NOTE — Progress Notes (Signed)
- [place] [facility] [place] [facility] [place] [facility] [name]   Follow-up Note  Referring Provider: Willow Ora, MD Primary Provider: Willow Ora, MD Date of Office Visit: 02/21/2023  Subjective:   Jennifer [facility] (DOB: 1980-08-01) is a 42 y.o. female who [facility] in re-evaluation of the following:  HPI: [name] returns to this clinic in evaluation of asthma, allergic rhinitis, LPR.  I last saw her in this clinic 22 November 2022.  Overall she has done pretty well with her airway issue and has not required a systemic steroid or an antibiotic for any type of airway issue and rarely uses a short acting bronchodilator while she continues on Wixela mostly 1 time per day and some nasal steroid.  She still occasionally has some cough especially after eating especially at night specially if she has the wrong foods.  She believes that her reflux is under pretty good control at this point in time for the most part while using omeprazole and occasionally some famotidine.  She is very careful about caffeine and chocolate consumption.  Allergies as of 02/21/2023       Reactions   Morphine And Codeine Nausea And Vomiting   Codeine Anxiety   Dry mouth, uneasy feeling        Medication List    Airsupra 90-80 MCG/ACT Aero Generic drug: Albuterol-Budesonide Inhale 2 puffs into the lungs as directed. 2 inhalations every 4-6 hours.   albuterol 108 (90 Base) MCG/ACT inhaler Commonly known as: VENTOLIN HFA Inhale 2 puffs into the lungs every 4 to 6 hours as needed for wheezing or shortness of breath   escitalopram 20 MG tablet Commonly known as: LEXAPRO Take 1 tablet (20 mg total) by mouth daily.   famotidine 40 MG tablet Commonly known as: PEPCID Take 1 tablet (40 mg total) by mouth at bedtime. As needed   fluticasone-salmeterol 250-50 MCG/ACT Aepb Commonly known as: Wixela Inhub Inhale 1 puff into the lungs in the morning and at  bedtime.   lamoTRIgine 100 MG tablet Commonly known as: LaMICtal Take 1 tablet (100 mg total) by mouth daily.   levocetirizine 5 MG tablet Commonly known as: Xyzal Allergy 24HR Take 1 tablet (5 mg total) by mouth every evening.   Nexplanon 68 MG Impl implant Generic drug: etonogestrel 1 each by Subdermal route once.   omeprazole 40 MG capsule Commonly known as: PRILOSEC Take 1 capsule (40 mg total) by mouth in the morning.   Qnasl 80 MCG/ACT Aers Generic drug: Beclomethasone Dipropionate Place 1-2 sprays into each nostril once daily.    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   HENT: Negative.    Eyes: Negative.   Respiratory: Negative.    Cardiovascular: Negative.   Gastrointestinal: Negative.   Genitourinary: Negative.   Musculoskeletal: Negative.   Skin: Negative.   Neurological:  Negative.   Endo/Heme/Allergies: Negative.   Psychiatric/Behavioral: Negative.       Objective:   Vitals:   02/21/23 1046  BP: 128/86  Pulse: 94  Resp: 16  Temp: 98 F (36.7 C)  SpO2: 98%   Height: 5' 2.3" (158.2 cm)  Weight: 217 lb 12.8 oz (98.8 kg)   Physical Exam Constitutional:      Appearance: She is not diaphoretic.  HENT:     Head: Normocephalic.     Right Ear: Tympanic membrane, ear canal and external ear normal.     Left Ear: Tympanic membrane, ear canal and external ear normal.     Nose: Nose normal. No mucosal edema or rhinorrhea.     Mouth/Throat:      Pharynx: Uvula midline. No oropharyngeal exudate.  Eyes:     Conjunctiva/sclera: Conjunctivae normal.  Neck:     Thyroid: No thyromegaly.     Trachea: Trachea normal. No tracheal tenderness or tracheal deviation.  Cardiovascular:     Rate and Rhythm: Normal rate and regular rhythm.     Heart sounds: Normal heart sounds, S1 normal and S2 normal. No murmur heard. Pulmonary:     Effort: No respiratory distress.     Breath sounds: Normal breath sounds. No stridor. No wheezing or rales.  Lymphadenopathy:     Head:     Right side of head: No tonsillar adenopathy.     Left side of head: No tonsillar adenopathy.     Cervical: No cervical adenopathy.  Skin:    Findings: No erythema or rash.     Nails: There is no clubbing.  Neurological:     Mental Status: She is alert.     Diagnostics:    Spirometry was performed and demonstrated an FEV1 of 2.06 at 86 % of predicted.  The patient had an Asthma Control Test with the following results: ACT Total Score: 23.    Assessment and Plan:   1. Asthma, moderate persistent, well-controlled   2. Perennial allergic rhinitis   3. Seasonal allergic rhinitis due to pollen   4. LPRD (laryngopharyngeal reflux disease)    1.  Allergen avoidance measures - dog, grass, tree, mold  2.  Continue to treat and prevent inflammation:  A. Wixela 250 -1 inhalation 1-2 times per day  B. Qnasl 80 -1-2 puffs each nostril 1 time per day    3. Treat and prevent reflux / LPR:   A. Omeprazole 40 mg - 1 tablet in AM  B. Famotidine 40 mg- 1 tablet at night if needed  C. Minimize caffeine and chocolate consumption  D. Replace throat clearing with drinking/swallowing maneuver  E. Visit with GI for upper endoscopy  4.  If needed:   A.  Albuterol - 2 inhalations every 4-6 hours  B.  OTC nasal saline  C.  OTC antihistamine  5.  Discuss with Dr. Mardelle Matte about Reginal Lutes / Zepbound administration  6.  Return to clinic in 6 months or earlier if problem  [name]  appears to be doing very well with her airway issue and she has a good understanding of her disease state and how her medications work and appropriate dosing of her medications depending on disease activity.  I do not think her reflux is under very good control and given the fact that she still has some problems with intermittent regurgitation and some throat issues with cough and he has been using a proton pump inhibitor and is already perform some behavioral modification I think she  should visit with a GI doctor for an upper endoscopy to make sure she does not have eosinophilic esophagitis contributing to this issue.  And, I did have a discussion with her about possibly losing weight which may help her reflux somewhat and she can further discuss this issue with Dr. Mardelle Matte.  I will see her back in this clinic in 6 months or earlier if there is a problem.  Laurette Schimke, MD Allergy / Immunology [facility]

## 2023-02-21 NOTE — Patient Instructions (Addendum)
  1.  Allergen avoidance measures - dog, grass, tree, mold  2.  Continue to treat and prevent inflammation:  A. Wixela 250 -1 inhalation 1-2 times per day  B. Qnasl 80 -1-2 puffs each nostril 1 time per day    3. Treat and prevent reflux / LPR:   A. Omeprazole 40 mg - 1 tablet in AM  B. Famotidine 40 mg- 1 tablet at night if needed  C. Minimize caffeine and chocolate consumption  D. Replace throat clearing with drinking/swallowing maneuver  E. Visit with GI for upper endoscopy  4.  If needed:   A.  Albuterol - 2 inhalations every 4-6 hours  B.  OTC nasal saline  C.  OTC antihistamine  5.  Discuss with Dr. Mardelle Matte about Reginal Lutes / Zepbound administration  6.  Return to clinic in 6 months or earlier if problem

## 2023-02-22 ENCOUNTER — Encounter: Payer: Self-pay | Admitting: Allergy and Immunology

## 2023-03-01 ENCOUNTER — Other Ambulatory Visit: Payer: Self-pay | Admitting: Family Medicine

## 2023-03-03 ENCOUNTER — Other Ambulatory Visit (HOSPITAL_COMMUNITY): Payer: Self-pay

## 2023-03-03 MED ORDER — LAMOTRIGINE 100 MG PO TABS
100.0000 mg | ORAL_TABLET | Freq: Every day | ORAL | 0 refills | Status: DC
  Filled 2023-03-03: qty 30, 30d supply, fill #0

## 2023-03-09 ENCOUNTER — Emergency Department (HOSPITAL_BASED_OUTPATIENT_CLINIC_OR_DEPARTMENT_OTHER)
Admission: EM | Admit: 2023-03-09 | Discharge: 2023-03-10 | Disposition: A | Discharge status: Home / Self Care | Payer: Commercial Managed Care - PPO | Attending: Emergency Medicine | Admitting: Emergency Medicine

## 2023-03-09 ENCOUNTER — Encounter (HOSPITAL_BASED_OUTPATIENT_CLINIC_OR_DEPARTMENT_OTHER): Payer: Self-pay

## 2023-03-09 ENCOUNTER — Emergency Department (HOSPITAL_BASED_OUTPATIENT_CLINIC_OR_DEPARTMENT_OTHER): Payer: Commercial Managed Care - PPO | Admitting: Radiology

## 2023-03-09 DIAGNOSIS — M940 Chondrocostal junction syndrome [Tietze]: Secondary | ICD-10-CM | POA: Insufficient documentation

## 2023-03-09 DIAGNOSIS — R0602 Shortness of breath: Secondary | ICD-10-CM | POA: Diagnosis not present

## 2023-03-09 LAB — BASIC METABOLIC PANEL
Anion gap: 9 (ref 5–15)
BUN: 10 mg/dL (ref 6–20)
CO2: 23 mmol/L (ref 22–32)
Calcium: 9.2 mg/dL (ref 8.9–10.3)
Chloride: 104 mmol/L (ref 98–111)
Creatinine, Ser: 0.69 mg/dL (ref 0.44–1.00)
GFR, Estimated: >60 mL/min (ref >60)
Glucose, Bld: 99 mg/dL (ref 70–99)
Potassium: 3.6 mmol/L (ref 3.5–5.1)
Sodium: 136 mmol/L (ref 135–145)

## 2023-03-09 LAB — CBC
HCT: 36.9 % (ref 36.0–46.0)
Hemoglobin: 12.4 g/dL (ref 12.0–15.0)
MCH: 30.5 pg (ref 26.0–34.0)
MCHC: 33.6 g/dL (ref 30.0–36.0)
MCV: 90.9 fL (ref 80.0–100.0)
Platelets: 345 10*3/uL (ref 150–400)
RBC: 4.06 MIL/uL (ref 3.87–5.11)
RDW: 12.5 % (ref 11.5–15.5)
WBC: 6.2 10*3/uL (ref 4.0–10.5)
nRBC: 0 % (ref 0.0–0.2)

## 2023-03-09 LAB — TROPONIN I (HIGH SENSITIVITY): Troponin I (High Sensitivity): <2 ng/L (ref <18)

## 2023-03-09 MED ORDER — ACETAMINOPHEN 500 MG PO TABS
1000.0000 mg | ORAL_TABLET | Freq: Once | ORAL | Status: AC
  Administered 2023-03-09: 1000 mg via ORAL
  Filled 2023-03-09: qty 2

## 2023-03-09 MED ORDER — KETOROLAC TROMETHAMINE 15 MG/ML IJ SOLN
15.0000 mg | Freq: Once | INTRAMUSCULAR | Status: AC
Start: 2023-03-09 — End: 2023-03-09
  Administered 2023-03-09: 15 mg via INTRAMUSCULAR
  Filled 2023-03-09: qty 1

## 2023-03-09 MED ORDER — CELECOXIB 200 MG PO CAPS
200.0000 mg | ORAL_CAPSULE | Freq: Two times a day (BID) | ORAL | 0 refills | Status: DC
  Filled 2023-03-09 – 2023-03-10 (×2): qty 20, 10d supply, fill #0

## 2023-03-09 NOTE — ED Triage Notes (Signed)
Pt c/o "cramp in R side of back, & then another came to my L back/ shoulder." Pain is somewhat relieved when she pushes down on chest. Pt states she took a pepcid thinking it was reflux, no relief.

## 2023-03-09 NOTE — ED Provider Notes (Signed)
Jennifer [facility] EMERGENCY DEPARTMENT AT [facility] Provider Note   CSN: [id number] Arrival date & time: 03/09/23  2136     History Chief Complaint  Patient presents with   Shortness of Breath   Back Pain    HPI Jennifer Dean is a 42 y.o. female presenting for episodic chest and back pain. States been going on for 2 weeks with an acute exacerbation 3 hours ago.  Not improving tonight with Pepcid.  Denies fevers chills nausea vomiting shortness of breath otherwise.  Otherwise ambulatory tolerating p.o. intake on arrival.  No other medications prior to arrival..   Patient's recorded medical, surgical, social, medication list and allergies were reviewed in the Snapshot window as part of the initial history.   Review of Systems   Review of Systems  Constitutional:  Negative for chills and fever.  HENT:  Negative for ear pain and sore throat.   Eyes:  Negative for pain and visual disturbance.  Respiratory:  Positive for shortness of breath. Negative for cough.   Cardiovascular:  Positive for chest pain. Negative for palpitations.  Gastrointestinal:  Negative for abdominal pain and vomiting.  Genitourinary:  Negative for dysuria and hematuria.  Musculoskeletal:  Negative for arthralgias and back pain.  Skin:  Negative for color change and rash.  Neurological:  Negative for seizures and syncope.  All other systems reviewed and are negative.   Physical Exam Updated Vital Signs BP (!) 146/99 (BP Location: Right Arm)   Pulse 85   Temp 97.8 F (36.6 C)   Resp 16   SpO2 99%  Physical Exam Vitals and nursing note reviewed.  Constitutional:      General: She is not in acute distress.    Appearance: She is well-developed.  HENT:     Head: Normocephalic and atraumatic.  Eyes:     Conjunctiva/sclera: Conjunctivae normal.  Cardiovascular:     Rate and Rhythm: Normal rate and regular rhythm.     Heart sounds: No murmur heard. Pulmonary:     Effort: Pulmonary effort is  normal. No respiratory distress.     Breath sounds: Normal breath sounds.  Abdominal:     Palpations: Abdomen is soft.     Tenderness: There is no abdominal tenderness.  Musculoskeletal:        General: No swelling.     Cervical back: Neck supple.     Comments: Pain is exacerbated by abduction of the left arm as well as adduction of the left arm.  Tender to palpation across the left thoracic wall.  Skin:    General: Skin is warm and dry.     Capillary Refill: Capillary refill takes less than 2 seconds.  Neurological:     Mental Status: She is alert.  Psychiatric:        Mood and Affect: Mood normal.      ED Course/ Medical Decision Making/ A&P    Procedures Procedures   Medications Ordered in ED Medications  ketorolac (TORADOL) 15 MG/ML injection 15 mg (has no administration in time range)  acetaminophen (TYLENOL) tablet 1,000 mg (has no administration in time range)   Medical Decision Making: Jennifer Dean is a 42 y.o. female who presented to the ED today with chest pain, detailed above.  Based on patient's comorbidities, patient has a heart score of 1.    Patient placed on continuous vitals and telemetry monitoring while in ED which was reviewed periodically.  Complete initial physical exam performed, notably the patient was HDS in  NAD.   Reviewed and confirmed nursing documentation for past medical history, family history, social history.    Initial Assessment: With the patient's presentation of left-sided chest pain, most likely diagnosis is musculoskeletal chest pain versus GERD, although ACS remains on the differential. Other diagnoses were considered including (but not limited to) pulmonary embolism, community-acquired pneumonia, aortic dissection, pneumothorax, underlying bony abnormality, anemia. These are considered less likely due to history of present illness and physical exam findings.    In particular, concerning pulmonary embolism: Patient is PERC Negative  and the they deny malignancy, recent surgery, history of DVT, or calf tenderness leading to a low risk Wells score. Aortic Dissection also reconsidered but seems less likely based on the location, quality, onset, and severity of symptoms in this case. Patient has a lack of serious comorbidities for this condition including a lack of HTN or Smoking. Patient also has a lack of underlying history of AD or TAA.  This is most consistent with an acute life/limb threatening illness complicated by underlying chronic conditions.   Initial Plan: Evaluate for ACS with single troponin and EKG evaluated as below  Evaluate for dissection, bony abnormality, or pneumonia with chest x-ray and screening laboratory evaluation including CBC, BMP  Further evaluation for pulmonary embolism not indicated at this time based on patient's PERC and Wells score.  Further evaluation for Thoracic Aortic Dissection not indicated at this time based on patient's clinical history and PE findings.   Initial Study Results: EKG was reviewed independently. Rate, rhythm, axis, intervals all examined and without medically relevant abnormality. ST segments without concerns for elevations.    Laboratory  Single troponin demonstrated NAA   CBC and BMP without obvious metabolic or inflammatory abnormalities requiring further evaluation   Radiology  DG Chest 2 View  Result Date: 03/09/2023 CLINICAL DATA:  Shortness of breath EXAM: CHEST - 2 VIEW COMPARISON:  09/15/2022 FINDINGS: The heart size and mediastinal contours are within normal limits. Both lungs are clear. The visualized skeletal structures are unremarkable. IMPRESSION: No active cardiopulmonary disease. Electronically Signed   By: Jasmine Pang M.D.   On: 03/09/2023 22:32    Final Assessment and Plan: Jennifer Dean likely musculoskeletal versus gastroesophageal etiology of patient's symptoms.  Considered ACS grossly less likely given history, well appearance and negative troponin and  low HEART score.  Patient to follow-up with primary care provider within 48 hours for ongoing care and management.   Disposition:  I have considered need for hospitalization, however, considering all of the above, I believe this patient is stable for discharge at this time.  Patient/family educated about specific return precautions for given chief complaint and symptoms.  Patient/family educated about follow-up with PCP.     Patient/family expressed understanding of return precautions and need for follow-up. Patient spoken to regarding all imaging and laboratory results and appropriate follow up for these results. All education provided in verbal form with additional information in written form. Time was allowed for answering of patient questions. Patient discharged.    Emergency Department Medication Summary:   Medications  ketorolac (TORADOL) 15 MG/ML injection 15 mg (has no administration in time range)  acetaminophen (TYLENOL) tablet 1,000 mg (has no administration in time range)          Clinical Impression:  1. Acute costochondritis      Data Unavailable   Final Clinical Impression(s) / ED Diagnoses Final diagnoses:  Acute costochondritis    Rx / DC Orders ED Discharge Orders  Ordered    celecoxib (CELEBREX) 200 MG capsule  2 times daily        03/09/23 2247              Glyn Ade, MD 03/09/23 2250

## 2023-03-10 ENCOUNTER — Other Ambulatory Visit (HOSPITAL_COMMUNITY): Payer: Self-pay

## 2023-03-10 ENCOUNTER — Telehealth: Payer: Self-pay | Admitting: Family Medicine

## 2023-03-10 NOTE — Telephone Encounter (Signed)
Noted  

## 2023-03-10 NOTE — Telephone Encounter (Signed)
Pt was seen in [facility] on 03/09/23  Patient Name First: Jennifer Last: [place] [facility] Gender: Female DOB: [birth year] Age: 42 Y 11 M 14 D Return Phone Number: [phone] (Primary) Address: City/ State/ Zip: [place]  [address] [facility] - Human resources officer [facility] [employer] Provider Asencion Partridge- MD Contact Type Call Who Is Calling Patient / Member / Family / Caregiver Call Type Triage / Clinical Relationship To Patient Self Return Phone Number [phone] (Primary) Chief Complaint CHEST PAIN - pain, pressure, heaviness or tightness Reason for Call Symptomatic / Request for Health Information Initial Comment Caller states this morning she had a cramp in the right side of her back, and wouldn't go away and now she has the same pain in her upper shoulder and now the pain has come to her chest. Translation No Nurse Assessment Nurse: Laural Benes, RN, Faith Date/Time (Eastern Time): 03/09/2023 8:49:22 PM Confirm and document reason for call. If symptomatic, describe symptoms. ---Caller says she has chest pain above breast on left side like a cramp. No SOB. Pain started in her back/ shoulder area and now moved to the front. Has been going on all day. Rates pain a 8/10. Does the patient have any new or worsening symptoms? ---Yes Will a triage be completed? ---Yes Related visit to physician within the last 2 weeks? ---No Does the PT have any chronic conditions? (i.e. diabetes, asthma, this includes High risk factors for pregnancy, etc.) ---Yes List chronic conditions. ---GERD Is the patient pregnant or possibly pregnant? (Ask all females between the ages of 34-55) ---No Is this a behavioral health or substance abuse call? ---No Guidelines Guideline Title Affirmed Question Affirmed Notes Nurse Date/Time Lamount Cohen Time) Chest Pain SEVERE chest pain Laural Benes, RN, Faith 03/09/2023 8:49:30 PM Disp. Time  Lamount Cohen Time) Disposition Final User 03/09/2023 8:46:22 PM Send to Urgent Queue Lorrin Goodell 03/09/2023 8:54:06 PM Go to ED Now Yes Laural Benes, RN, Faith Final Disposition 03/09/2023 8:54:06 PM Go to ED Now Yes Laural Benes, RN, Faith Caller Disagree/Comply Comply Caller Understands Yes PreDisposition Did not know what to do Care Advice Given Per Guideline GO TO ED NOW: * You need to be seen in the Emergency Department. * Go to the ED at ___________ [facility]. * Leave now. Drive carefully. * Another adult should drive. NOTHING BY MOUTH: * Do not eat or drink anything for now. CALL 911 IF: * Severe difficulty breathing occurs * Passes out or becomes too weak to stand * You become worse CARE ADVICE given per Chest Pain (Adult) guideline. Comments User: Amador Cunas, RN Date/Time Lamount Cohen Time): 03/09/2023 8:53:51 PM took 2 Pepcid earlier and no improvement Referrals [facility] - ED

## 2023-04-20 ENCOUNTER — Other Ambulatory Visit (HOSPITAL_COMMUNITY): Payer: Self-pay

## 2023-04-21 ENCOUNTER — Other Ambulatory Visit (HOSPITAL_COMMUNITY): Payer: Self-pay

## 2023-04-21 ENCOUNTER — Other Ambulatory Visit: Payer: Self-pay

## 2023-05-08 ENCOUNTER — Ambulatory Visit: Payer: Self-pay | Admitting: Family Medicine

## 2023-05-08 NOTE — Telephone Encounter (Addendum)
Chief Complaint: Vaginal bleeding Symptoms: Vaginal bleeding Frequency: Constant Pertinent Negatives: Patient denies abdominal cramping, dizziness, discharge, weakness, pregnancy, breastfeeding Disposition: [] ED /[] Urgent Care (no appt availability in office) / [x] Appointment(In office/virtual)/ []  [facility]/ [] Home Care/ [] Refused Recommended Disposition /[] [employer] [facility]/ []  Follow-up with PCP Additional Notes: Patient called in with complaints of vaginal bleeding past normal cycle. Patient states that she usually bleeds heavy, but is experiencing light bleeding after normal cycle duration, and is changing panty liners twice a day. Patient denies, blood clots, discharge, weakness, dizziness, abdominal cramping, pregnancy, breastfeeding or any gynecological surgeries. Patients states she uses Nexplanon, but has never had issues prior, and cycles usually last about 7-8 days. Blood is normal bright red color; bleeding started on 17th, became heavy on 21th, now light bleeding. Patient advised by this RN to be seen within 2 weeks, to which patient was agreeable. Appt scheduled. Patient advised by this RN to call back with worsening symptoms. Patient verbalized understanding.    Copied from CRM [id number]. Topic: Clinical - Red Word Triage >> May 08, 2023  4:36 PM Drema Balzarine wrote: Red Word that prompted transfer to Nurse Triage: Patient has been bleeding since 04/21/23, she is on her 2nd round of Nexplanon birth control. Bleeding has been heavier since 04/25/23. Reason for Disposition  [1] Bleeding or spotting between regular periods AND [2] occurs more than three cycles (3 months) AND [3] using birth control medicine (pills, patch, Depo-Provera, Implanon, vaginal ring, Mirena IUD)  Answer Assessment - Initial Assessment Questions 1. AMOUNT: "Describe the bleeding that you are having."    - SPOTTING: spotting, or pinkish / brownish mucous discharge; does not fill panty liner or pad     - MILD:  less than 1 pad / hour; less than patient's usual menstrual bleeding   - MODERATE: 1-2 pads / hour; 1 menstrual cup every 6 hours; small-medium blood clots (e.g., pea, grape, small coin)   - SEVERE: soaking 2 or more pads/hour for 2 or more hours; 1 menstrual cup every 2 hours; bleeding not contained by pads or continuous red blood from vagina; large blood clots (e.g., golf ball, large coin)      Changing panty liner 2/day 2. ONSET: "When did the bleeding begin?" "Is it continuing now?"     17th 3. MENSTRUAL PERIOD: "When was the last normal menstrual period?" "How is this different than your period?"     December 4. REGULARITY: "How regular are your periods?"     "Typically it last 7-8days." 5. ABDOMEN PAIN: "Do you have any pain?" "How bad is the pain?"  (e.g., Scale 1-10; mild, moderate, or severe)   - MILD (1-3): doesn't interfere with normal activities, abdomen soft and not tender to touch    - MODERATE (4-7): interferes with normal activities or awakens from sleep, abdomen tender to touch    - SEVERE (8-10): excruciating pain, doubled over, unable to do any normal activities      Denies 8. HORMONE MEDICINES: "Are you taking any hormone medicines, prescription or over-the-counter?" (e.g., birth control pills, estrogen)  Nexplanon 9. BLOOD THINNER MEDICINES: "Do you take any blood thinners?" (e.g., Coumadin / warfarin, Pradaxa / dabigatran, aspirin)     Denies 10. CAUSE: "What do you think is causing the bleeding?" (e.g., recent gyn surgery, recent gyn procedure; known bleeding disorder, cervical cancer, polycystic ovarian disease, fibroids)         Denies "I used to have fibroids but I haven't had a problem with  those in years." 11. HEMODYNAMIC STATUS: "Are you weak or feeling lightheaded?" If Yes, ask: "Can you stand and walk normally?"        Denies weakness/lightheadedness. 12. OTHER SYMPTOMS: "What other symptoms are you having with the bleeding?" (e.g., passed tissue,  vaginal discharge, fever, menstrual-type cramps)       Denies  Protocols used: Vaginal Bleeding - Abnormal-A-AH

## 2023-05-09 NOTE — Telephone Encounter (Signed)
 Noted

## 2023-05-12 ENCOUNTER — Encounter: Payer: Self-pay | Admitting: Allergy and Immunology

## 2023-05-12 ENCOUNTER — Other Ambulatory Visit (HOSPITAL_COMMUNITY): Payer: Self-pay

## 2023-05-12 ENCOUNTER — Encounter: Payer: Self-pay | Admitting: Family Medicine

## 2023-05-12 ENCOUNTER — Other Ambulatory Visit: Payer: Self-pay | Admitting: Family

## 2023-05-12 DIAGNOSIS — F4310 Post-traumatic stress disorder, unspecified: Secondary | ICD-10-CM | POA: Diagnosis not present

## 2023-05-16 ENCOUNTER — Other Ambulatory Visit (HOSPITAL_COMMUNITY): Payer: Self-pay

## 2023-05-16 ENCOUNTER — Ambulatory Visit: Payer: Commercial Managed Care - PPO | Admitting: Family Medicine

## 2023-05-16 ENCOUNTER — Encounter: Payer: Self-pay | Admitting: Family Medicine

## 2023-05-16 VITALS — BP 134/88 | HR 72 | Temp 98.2°F | Ht 62.3 in | Wt 225.6 lb

## 2023-05-16 DIAGNOSIS — N939 Abnormal uterine and vaginal bleeding, unspecified: Secondary | ICD-10-CM

## 2023-05-16 DIAGNOSIS — Z789 Other specified health status: Secondary | ICD-10-CM | POA: Diagnosis not present

## 2023-05-16 DIAGNOSIS — M7581 Other shoulder lesions, right shoulder: Secondary | ICD-10-CM | POA: Diagnosis not present

## 2023-05-16 MED ORDER — DICLOFENAC SODIUM 75 MG PO TBEC
75.0000 mg | DELAYED_RELEASE_TABLET | Freq: Two times a day (BID) | ORAL | 0 refills | Status: DC
  Filled 2023-05-16: qty 30, 15d supply, fill #0

## 2023-05-16 MED ORDER — LAMOTRIGINE 100 MG PO TABS
100.0000 mg | ORAL_TABLET | Freq: Every day | ORAL | 3 refills | Status: AC
  Filled 2023-05-16: qty 90, 90d supply, fill #0
  Filled 2023-11-19: qty 90, 90d supply, fill #1
  Filled 2024-05-09: qty 90, 90d supply, fill #2
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
## 2023-05-16 NOTE — Patient Instructions (Signed)
Please follow up if symptoms do not improve or as needed.    VISIT SUMMARY:  Today, we discussed your prolonged menstrual bleeding and shoulder pain. We reviewed your history with Nexplanon and the recent changes in your menstrual cycle. We also examined your shoulder pain, which has been troubling you for the past three months.  YOUR PLAN:  -NEXPLANON-RELATED MENSTRUAL IRREGULARITIES: Nexplanon is a contraceptive device that can cause irregular menstrual bleeding as your body adjusts to the hormones. We discussed that this irregular bleeding is expected and advised you to continue with the current Nexplanon while monitoring any changes in your bleeding pattern.  -SHOULDER PAIN: Your shoulder pain, which has been present for three months, may be due to mild impingement or tendonitis. We have prescribed an anti-inflammatory medication to help reduce the pain and inflammation. If the pain persists after this treatment, we may consider physical therapy or a steroid injection.  INSTRUCTIONS:  Please continue with your current Nexplanon and monitor any changes in your bleeding pattern. Start the prescribed anti-inflammatory medication for your shoulder pain. If your shoulder pain does not improve after the medication, we may need to explore physical therapy or a steroid injection. Follow up with [place] if you have any concerns or if your symptoms persist.

## 2023-05-17 ENCOUNTER — Other Ambulatory Visit (HOSPITAL_COMMUNITY): Payer: Self-pay

## 2023-05-19 ENCOUNTER — Other Ambulatory Visit (HOSPITAL_COMMUNITY): Payer: Self-pay

## 2023-05-23 ENCOUNTER — Encounter: Payer: Self-pay | Admitting: Allergy and Immunology

## 2023-05-23 ENCOUNTER — Other Ambulatory Visit (HOSPITAL_COMMUNITY): Payer: Self-pay

## 2023-05-24 ENCOUNTER — Other Ambulatory Visit: Payer: Self-pay

## 2023-05-24 ENCOUNTER — Encounter: Payer: Self-pay | Admitting: Nurse Practitioner

## 2023-05-24 ENCOUNTER — Telehealth: Payer: Commercial Managed Care - PPO | Admitting: Nurse Practitioner

## 2023-05-24 ENCOUNTER — Telehealth: Payer: Commercial Managed Care - PPO

## 2023-05-24 ENCOUNTER — Other Ambulatory Visit (HOSPITAL_COMMUNITY): Payer: Self-pay

## 2023-05-24 ENCOUNTER — Ambulatory Visit: Payer: Commercial Managed Care - PPO | Admitting: Nurse Practitioner

## 2023-05-24 DIAGNOSIS — U071 COVID-19: Secondary | ICD-10-CM | POA: Diagnosis not present

## 2023-05-24 MED ORDER — MOLNUPIRAVIR EUA 200MG CAPSULE
4.0000 | ORAL_CAPSULE | Freq: Two times a day (BID) | ORAL | 0 refills | Status: AC
  Filled 2023-05-24: qty 40, 5d supply, fill #0
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
## 2023-05-24 NOTE — Telephone Encounter (Signed)
Copied from CRM [id number]. Topic: Clinical - Medication Refill >> May 23, 2023  1:29 PM Alcus Dad wrote: Most Recent Primary Care Visit:  Provider: Willow Ora  Department: [facility]  Visit Type: ACUTE  Date: 05/16/2023  Medication:  molnupiravir EUA (LAGEVRIO) 200 mg CAPS capsule; Take 4 capsules (800 mg total) by mouth 2 (two) times daily for 5 days.  Dispense: 40 capsule  Has the patient contacted their pharmacy? Yes (Agent: If no, request that the patient contact the pharmacy for the refill. If patient does not wish to contact the pharmacy document the reason why and proceed with request.) (Agent: If yes, when and what did the pharmacy advise?)  Is this the correct pharmacy for this prescription? Yes If no, delete pharmacy and type the correct one.  This is the patient's preferred pharmacy:  [facility] [address] [place] [address] Phone: [phone] Fax: [phone]  [id number] - [place], [place] - [address] AT [facility] [address] [place] [address] Phone: [phone] Fax: [phone]   Has the prescription been filled recently? No  Is the patient out of the medication? Yes  Has the patient been seen for an appointment in the last year OR does the patient have an upcoming appointment? Yes  Can we respond through MyChart? Yes  Patient stated that she tested positive for Covid-19  Agent: Please be advised that Rx refills may take up to 3 business days. We ask that you follow-up with your pharmacy.  Please Advise on this refill.  Message sent to provider to address

## 2023-05-24 NOTE — Patient Instructions (Signed)
It was great to see you!  Start molnupiravir twice a day  Drink plenty of fluids and get rest  You can alternate ibuprofen/tylenol as needed for your back pain and headache.   Let's follow-up if your symptoms worsen or don't improve.   Take care,  Rodman Pickle, NP

## 2023-05-24 NOTE — Assessment & Plan Note (Addendum)
Positive home covid-19 test with symptoms that started 2 days ago. She states that she normally takes molnupiravir instead of paxlovid. Molnupiravir sent to the pharmacy. Encourage fluids, rest. Can alternate ibuprofen and tylenol as needed for pain/fever.   Reviewed home care instructions for COVID. Advised self-isolation at home until fever free for 24 hours without tylenol or ibuprofen and symptoms are starting to feel better. If symptoms, esp, dyspnea develops/worsens, recommend in-person evaluation at either an urgent care or the emergency room.

## 2023-05-31 DIAGNOSIS — F4310 Post-traumatic stress disorder, unspecified: Secondary | ICD-10-CM | POA: Diagnosis not present

## 2023-06-19 ENCOUNTER — Encounter: Payer: Self-pay | Admitting: Family Medicine

## 2023-07-03 DIAGNOSIS — F4310 Post-traumatic stress disorder, unspecified: Secondary | ICD-10-CM | POA: Diagnosis not present

## 2023-07-25 DIAGNOSIS — F4310 Post-traumatic stress disorder, unspecified: Secondary | ICD-10-CM | POA: Diagnosis not present

## 2023-08-22 ENCOUNTER — Ambulatory Visit: Payer: Commercial Managed Care - PPO | Admitting: Allergy and Immunology

## 2023-08-22 ENCOUNTER — Other Ambulatory Visit: Payer: Self-pay

## 2023-08-22 ENCOUNTER — Other Ambulatory Visit (HOSPITAL_COMMUNITY): Payer: Self-pay

## 2023-08-22 ENCOUNTER — Ambulatory Visit: Admitting: Obstetrics and Gynecology

## 2023-08-22 ENCOUNTER — Encounter: Payer: Self-pay | Admitting: Obstetrics and Gynecology

## 2023-08-22 VITALS — BP 126/81 | HR 81 | Ht 62.3 in | Wt 227.7 lb

## 2023-08-22 VITALS — BP 114/72 | HR 74 | Temp 98.4°F | Resp 16 | Ht 62.0 in | Wt 227.0 lb

## 2023-08-22 DIAGNOSIS — K219 Gastro-esophageal reflux disease without esophagitis: Secondary | ICD-10-CM

## 2023-08-22 DIAGNOSIS — J4540 Moderate persistent asthma, uncomplicated: Secondary | ICD-10-CM

## 2023-08-22 DIAGNOSIS — J301 Allergic rhinitis due to pollen: Secondary | ICD-10-CM

## 2023-08-22 DIAGNOSIS — N939 Abnormal uterine and vaginal bleeding, unspecified: Secondary | ICD-10-CM | POA: Diagnosis not present

## 2023-08-22 DIAGNOSIS — Z3046 Encounter for surveillance of implantable subdermal contraceptive: Secondary | ICD-10-CM | POA: Diagnosis not present

## 2023-08-22 DIAGNOSIS — J3089 Other allergic rhinitis: Secondary | ICD-10-CM | POA: Diagnosis not present

## 2023-08-22 DIAGNOSIS — Z30011 Encounter for initial prescription of contraceptive pills: Secondary | ICD-10-CM | POA: Diagnosis not present

## 2023-08-22 DIAGNOSIS — E66812 Obesity, class 2: Secondary | ICD-10-CM | POA: Diagnosis not present

## 2023-08-22 DIAGNOSIS — Z6839 Body mass index (BMI) 39.0-39.9, adult: Secondary | ICD-10-CM | POA: Diagnosis not present

## 2023-08-22 MED ORDER — OMEPRAZOLE 40 MG PO CPDR
40.0000 mg | DELAYED_RELEASE_CAPSULE | Freq: Two times a day (BID) | ORAL | 1 refills | Status: AC | PRN
  Filled 2023-08-22: qty 180, 90d supply, fill #0
  Filled 2024-02-17: qty 180, 90d supply, fill #1

## 2023-08-22 MED ORDER — AIRSUPRA 90-80 MCG/ACT IN AERO
2.0000 | INHALATION_SPRAY | RESPIRATORY_TRACT | 1 refills | Status: AC | PRN
  Filled 2023-08-22: qty 10.7, 30d supply, fill #0

## 2023-08-22 MED ORDER — FLUTICASONE-SALMETEROL 250-50 MCG/ACT IN AEPB
1.0000 | INHALATION_SPRAY | Freq: Two times a day (BID) | RESPIRATORY_TRACT | 1 refills | Status: AC
  Filled 2023-08-22: qty 180, 90d supply, fill #0
  Filled 2024-02-23: qty 180, 90d supply, fill #1

## 2023-08-22 MED ORDER — FLUTICASONE PROPIONATE 50 MCG/ACT NA SUSP
2.0000 | Freq: Every day | NASAL | 5 refills | Status: AC
  Filled 2023-08-22: qty 16, 30d supply, fill #0
  Filled 2024-02-23: qty 16, 30d supply, fill #1

## 2023-08-22 NOTE — Progress Notes (Unsigned)
 NEW GYNECOLOGY PATIENT Patient name: Jennifer Dean MRN [id number]  Date of birth: Mar 28, 1981 Chief Complaint:   Contraception (Would like to remove Nexplanon  due to bleeding, but PCP would like her to keep it in, prefers pill. Had in since OCT but is 2nd placement ) and Establish Care     History:  Jennifer Dean is a 43 y.o. No obstetric history on file. being seen today for contraception management.    Lifetime of irregular menses. Did patch years ago and loved it and then got allergy. Would prefer to remove nexplanon  and switch to other form of medication for more predictable control of her menses.  No CHC contraindications     Gynecologic History No LMP recorded. Patient has had an implant. Contraception: Nexplanon  Last Pap:     Component Value Date/Time   DIAGPAP  04/07/2020 0948    - Negative for intraepithelial lesion or malignancy (NILM)   DIAGPAP  03/20/2019 0839    - Negative for intraepithelial lesion or malignancy (NILM)   HPVHIGH Negative 04/07/2020 0948   HPVHIGH Positive (A) 03/20/2019 0839   ADEQPAP  04/07/2020 0948    Satisfactory for evaluation; transformation zone component PRESENT.   ADEQPAP  03/20/2019 0839    Satisfactory for evaluation; transformation zone component PRESENT.     Last Mammogram: 01/2023 BIRADS 1 Last Colonoscopy: n/a  Obstetric History OB History  No obstetric history on file.    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   GYN manages; has had PT   History of chickenpox    Obesity (BMI 30-39.9) 07/26/2012   Overview:  Nl sleep study 2014    Vision abnormalities     Past Surgical History:  Procedure Laterality Date   WISDOM TOOTH EXTRACTION      Current Outpatient Medications on File Prior to Visit  Medication Sig Dispense Refill   albuterol  (VENTOLIN  HFA) 108  (90 Base) MCG/ACT inhaler Inhale 2 puffs into the lungs every 4 to 6 hours as needed for wheezing or shortness of breath 6.7 g 1   Albuterol -Budesonide  (AIRSUPRA ) 90-80 MCG/ACT AERO Inhale 2 Inhalations into the lungs every 4 (four) hours as needed. 10.7 g 1   escitalopram  (LEXAPRO ) 20 MG tablet Take 1 tablet (20 mg total) by mouth daily. 90 tablet 3   etonogestrel  (NEXPLANON ) 68 MG IMPL implant 1 each by Subdermal route once.     famotidine  (PEPCID ) 40 MG tablet Take 1 tablet (40 mg total) by mouth at bedtime. As needed 90 tablet 1   fluticasone  (FLONASE ) 50 MCG/ACT nasal spray Place 2 sprays into both nostrils daily. 16 g 5   fluticasone -salmeterol (WIXELA INHUB ) 250-50 MCG/ACT AEPB Inhale 1 puff into the lungs in the morning and at bedtime. 180 each 1   lamoTRIgine  (LAMICTAL ) 100 MG tablet Take 1 tablet (100 mg total) by mouth daily. 90 tablet 3   levocetirizine (XYZAL  ALLERGY 24HR) 5 MG tablet Take 1 tablet (5 mg total) by mouth daily as needed for allergies (Can take an extra dose during flare ups.). 180 tablet 1   omeprazole  (PRILOSEC) 40 MG capsule Take 1 capsule (40 mg total) by mouth 2 (two) times daily as needed. 180 capsule 1   Beclomethasone Dipropionate  80 MCG/ACT AERS Place 2 sprays into the nose daily. (Patient not taking: Reported on  08/22/2023) 31.8 g 1   diclofenac  (VOLTAREN ) 75 MG EC tablet Take 1 tablet (75 mg total) by mouth 2 (two) times daily. (Patient not taking: Reported on 08/22/2023) 30 tablet 0   [DISCONTINUED] TRI-LO-SPRINTEC 0.18/0.215/0.25 MG-25 MCG tab TAKE 1 TABLET BY MOUTH DAILY. 28 tablet 3   No current facility-administered medications on file prior to visit.    Allergies  Allergen Reactions   Morphine And Codeine Nausea And Vomiting   Codeine Anxiety    Dry mouth, uneasy feeling    Social History:  reports that she has never smoked. She has never been exposed to tobacco smoke. She has never used smokeless tobacco. She reports that she does not drink alcohol  and does not use drugs.  Family History  Problem Relation Age of Onset   Hypertension Mother    GER disease Mother    Eczema Mother    Miscarriages / [place] Mother    Hypertension Father    Post-traumatic stress disorder Father    Alcohol abuse Father    Arthritis Father    Depression Father    Drug abuse Father    Lung cancer Father 44       passed 07/14/2022   Arthritis Brother    Asthma Daughter    Learning disabilities Son    Miscarriages / Stillbirths Maternal Aunt    Obesity Maternal Aunt    Breast cancer Paternal Uncle 85   Cancer Paternal Uncle    Kidney cancer Paternal Uncle 47   Intellectual disability Paternal Uncle    Cancer Maternal Grandfather    Prostate cancer Maternal Grandfather        66s   Allergic rhinitis Neg Hx    Angioedema Neg Hx    Atopy Neg Hx    Immunodeficiency Neg Hx    Urticaria Neg Hx     The following portions of the patient's history were reviewed and updated as appropriate: allergies, current medications, past family history, past medical history, past social history, past surgical history and problem list.  Review of Systems Pertinent items noted in HPI and remainder of comprehensive ROS otherwise negative.  Physical Exam:  BP 126/81 (BP Location: Left Arm, Patient Position: Sitting, Cuff Size: Large)   Pulse 81   Ht 5' 2.3" (1.582 m)   Wt 227 lb 11.2 oz (103.3 kg)   SpO2 99%   BMI 41.25 kg/m  Physical Exam Vitals and nursing note reviewed.  Constitutional:      Appearance: Normal appearance.  Cardiovascular:     Rate and Rhythm: Normal rate.  Pulmonary:     Effort: Pulmonary effort is normal.     Breath sounds: Normal breath sounds.  Neurological:     General: No focal deficit present.     Mental Status: She is alert and oriented to person, place, and time.  Psychiatric:        Mood and Affect: Mood normal.        Behavior: Behavior normal.        Thought Content: Thought content normal.        Judgment: Judgment  normal.     NEXPLANON  REMOVAL The risks (including infection, bleeding, pain, and uterine perforation) and benefits of the procedure were explained to the patient and Written informed consent was obtained.   Device was palpated in left upper arm. After time out, the skin was cleaned with alcohol and infiltrated with 1cc of 1% lidocaine  with epinephrine  was used to infiltrate the skin and subcutaneous tissue  deep to the device. The area was cleaned with betadine x3.  Using an 11 blade, the skin was incised and the implant was removed intact.  The implant was shown to the patient.  The skin was cleaned, incision covered with Steri-Strips, and an adhesive bandage.  Arm was wrapped and post procedure instructions provided to the patient.  OCPs chose as new contraceptive method.    Assessment and Plan:   1. Abnormal uterine bleeding (AUB) (Primary) The use of the oral contraceptive has been fully discussed with the patient. This includes the proper method to initiate (i.e. Sunday start after next normal menstrual onset versus same day start) and continue the pills, the need for regular compliance to ensure adequate contraceptive effect, the physiology which make the pill effective, the instructions for what to do in event of a missed pill, and warnings about anticipated minor side effects such as breakthrough spotting, nausea, breast tenderness, weight changes, acne, headaches, etc.  They have been told of the more serious potential side effects such as MI, stroke, and deep vein thrombosis, all of which are very unlikely.  They have been asked to report any signs of such serious problems immediately.  They should back up the pill with a condom during any cycle in which antibiotics are prescribed, and during the first cycle as well. The need for additional protection, such as a condom, to prevent exposure to sexually transmitted diseases has also been discussed- the patient has been clearly reminded that  OCP's cannot protect them against diseases such as HIV and others. They understand and wish to take the medication as prescribed. They was given a prescription for aviane. Discussed skipping placebo to achieve amenorrhea which may take some time or cause irregular spotting.   - levonorgestrel-ethinyl estradiol (AVIANE) 0.1-20 MG-MCG tablet; Take 1 tablet by mouth daily, continously. Can skip placebo for better menstrual control.  Dispense: 28 tablet; Refill: 11  2. Nexplanon  removal Now s/p uncomplicated removal of nexplanon  device.    Routine preventative health maintenance measures emphasized. Please refer to After Visit Summary for other counseling recommendations.   Follow-up: No follow-ups on file.      Kiki Pelton, MD Obstetrician & Gynecologist, Faculty [facility] [employer], [facility]

## 2023-08-22 NOTE — Progress Notes (Signed)
 [place] - [place] - [place] - [place] - [place]   Follow-up Note  Referring Provider: Jodie Lavern CROME, MD Primary Provider: Jodie Lavern CROME, MD Date of Office Visit: 08/22/2023  Subjective:   Jennifer Dean (DOB: 07/16/1980) is a 43 y.o. female who [facility] in re-evaluation of the following:  HPI: Jennifer Dean returns to this clinic in evaluation of asthma, allergic rhinitis, LPR.  Last saw her in this clinic 21 February 2023.    She had about 6 weeks of coughing mid spring but fortunately that issue resolved.  Otherwise she thinks that she has good control of both her upper and lower airway disease while utilizing Wixela mostly 1 time per day and nasal fluticasone  on a pretty consistent basis.  She has not required a systemic steroid or an antibiotic for any type of airway issue and she rarely uses a short acting bronchodilator.  She still has some occasional regurgitation events even while aggressively treating her reflux.  But overall she believes that she has better control of her reflux disease while using omeprazole  every day and sometimes twice a day and eliminating most caffeine and chocolate consumption.  I did make the recommendation last visit that she consider using a GLP-1 agonist to help with her longstanding weight issue which has been a very difficult issue for her to get under control.  She did discuss this with her primary care doctor but there was a mutual decision not to start this agent at that point.  Allergies as of 08/22/2023       Reactions   Morphine And Codeine Nausea And Vomiting   Codeine Anxiety   Dry mouth, uneasy feeling        Medication List    albuterol  108 (90 Base) MCG/ACT inhaler Commonly known as: VENTOLIN  HFA Inhale 2 puffs into the lungs every 4 to 6 hours as needed for wheezing or shortness of breath   Beclomethasone Dipropionate  80 MCG/ACT Aers Place 2 sprays into the nose  daily.   diclofenac  75 MG EC tablet Commonly known as: VOLTAREN  Take 1 tablet (75 mg total) by mouth 2 (two) times daily.   escitalopram  20 MG tablet Commonly known as: LEXAPRO  Take 1 tablet (20 mg total) by mouth daily.   famotidine  40 MG tablet Commonly known as: PEPCID  Take 1 tablet (40 mg total) by mouth at bedtime. As needed   fluticasone -salmeterol 250-50 MCG/ACT Aepb Commonly known as: Wixela Inhub  Inhale 1 puff into the lungs in the morning and at bedtime.   lamoTRIgine  100 MG tablet Commonly known as: LaMICtal  Take 1 tablet (100 mg total) by mouth daily.   levocetirizine 5 MG tablet Commonly known as: Xyzal  Allergy 24HR Take 1 tablet (5 mg total) by mouth daily as needed for allergies (Can take an extra dose during flare ups.).   Nexplanon  68 MG Impl implant Generic drug: etonogestrel  1 each by Subdermal route once.   omeprazole  40 MG capsule Commonly known as: PRILOSEC Take 1 capsule (40 mg total) by mouth in the morning.    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   HENT: Negative.    Eyes: Negative.   Respiratory: Negative.    Cardiovascular: Negative.   Gastrointestinal: Negative.   Genitourinary: Negative.   Musculoskeletal: Negative.   Skin: Negative.   Neurological: Negative.   Endo/Heme/Allergies: Negative.   Psychiatric/Behavioral: Negative.       Objective:   Vitals:   08/23/23 1406  BP: 114/72  Pulse: 74  Resp: 16  Temp: 98.4 F  (36.9 C)  SpO2: 96%   Height: 5' 2 (157.5 cm)  Weight: 227 lb (103 kg)   Physical Exam Constitutional:      Appearance: She is not diaphoretic.  HENT:     Head: Normocephalic.     Right Ear: Tympanic membrane, ear canal and external ear normal.     Left Ear: Tympanic membrane, ear canal and external ear normal.     Nose: Nose normal. No mucosal edema or rhinorrhea.     Mouth/Throat:     Pharynx: Uvula midline. No oropharyngeal exudate.  Eyes:     Conjunctiva/sclera: Conjunctivae normal.  Neck:     Thyroid : No thyromegaly.     Trachea: Trachea normal. No tracheal tenderness or tracheal deviation.  Cardiovascular:     Rate and Rhythm: Normal rate and regular rhythm.     Heart sounds: Normal heart sounds, S1 normal and S2 normal. No murmur heard. Pulmonary:     Effort: No respiratory distress.     Breath sounds: Normal breath sounds. No stridor. No wheezing or rales.  Lymphadenopathy:     Head:     Right side of head: No tonsillar adenopathy.     Left side of head: No tonsillar adenopathy.     Cervical: No cervical adenopathy.  Skin:    Findings: No erythema or rash.     Nails: There is no clubbing.  Neurological:     Mental Status: She is alert.     Diagnostics: Spirometry was performed and demonstrated an FEV1 of 2.08 at 87 % of predicted.  Assessment and Plan:   1. Asthma, moderate persistent, well-controlled   2. Perennial allergic rhinitis   3. Seasonal allergic rhinitis due to pollen   4. LPRD (laryngopharyngeal reflux disease)   5. Class 2 severe obesity due to excess calories with serious comorbidity and body mass index (BMI) of 39.0 to 39.9 in adult (HCC)      1.  Allergen avoidance measures - dog, grass, tree, mold  2.  Continue to treat and prevent inflammation:  A. Wixela 250 -1 inhalation 1-2 times per day  B. Fluticasone  - 1-2 sprays each nostril 1 time per day    3. Treat and prevent reflux / LPR:   A. Omeprazole  40 mg - 1 tablet 1-2 times  per day  B. Always minimize caffeine and chocolate consumption  D. Replace throat clearing with drinking/swallowing maneuver  4.  If needed:   A.  AIRSUPRA  - 2 inhalations every 4-6 hours (Coupon)  B.  OTC nasal saline  C.  OTC antihistamine  5. Immunotherapy???  6. Zepbound???  7.  Return to clinic in 6 months or earlier if problem  8. Influenza = Tamiflu. Covid = Paxlovid  Overall Jennifer Dean appears to be doing pretty well and she will maintain therapy  with anti-inflammatory medications for her airway and she will maintain therapy with proton pump inhibitor directed against her reflux and if she continues to do well then we will just see her back in this clinic in 6 months.  If she does have recurrent exacerbations as she moves forward then she needs to consider starting a course of immunotherapy.  I have also had a talk with her today about Zepbound as an alternative for her weight issue above and beyond her attempt to lose weight with behavioral modification.  I will see her back in this clinic in 6 months or earlier if there is a problem.  Camellia Denis, MD Allergy / Immunology [facility]

## 2023-08-22 NOTE — Patient Instructions (Addendum)
  1.  Allergen avoidance measures - dog, grass, tree, mold  2.  Continue to treat and prevent inflammation:  A. Wixela 250 -1 inhalation 1-2 times per day  B. Fluticasone  - 1-2 sprays each nostril 1 time per day    3. Treat and prevent reflux / LPR:   A. Omeprazole  40 mg - 1 tablet 1-2 times per day  B. Always minimize caffeine and chocolate consumption  D. Replace throat clearing with drinking/swallowing maneuver  4.  If needed:   A.  AIRSUPRA  - 2 inhalations every 4-6 hours (Coupon)  B.  OTC nasal saline  C.  OTC antihistamine  5. Immunotherapy???  6. Zepbound???  7.  Return to clinic in 6 months or earlier if problem  8. Influenza = Tamiflu. Covid = Paxlovid

## 2023-08-23 ENCOUNTER — Encounter: Payer: Self-pay | Admitting: Obstetrics and Gynecology

## 2023-08-23 ENCOUNTER — Encounter: Payer: Self-pay | Admitting: Allergy and Immunology

## 2023-08-23 ENCOUNTER — Other Ambulatory Visit (HOSPITAL_COMMUNITY): Payer: Self-pay

## 2023-08-23 MED ORDER — LEVONORGESTREL-ETHINYL ESTRAD 0.1-20 MG-MCG PO TABS
1.0000 | ORAL_TABLET | Freq: Every day | ORAL | 11 refills | Status: DC
  Filled 2023-08-23: qty 28, 28d supply, fill #0
  Filled 2023-08-23: qty 28, 21d supply, fill #0
  Filled 2023-09-07 – 2023-09-08 (×2): qty 28, 21d supply, fill #1
  Filled 2023-09-27: qty 28, 21d supply, fill #2
  Filled 2023-10-18: qty 28, 21d supply, fill #3
  Filled 2023-11-07: qty 28, 21d supply, fill #4
  Filled 2023-11-30: qty 28, 21d supply, fill #5
  Filled 2023-12-29: qty 28, 21d supply, fill #6
  Filled 2024-01-22: qty 28, 21d supply, fill #7
  Filled 2024-02-07: qty 28, 21d supply, fill #8
  Filled 2024-02-23: qty 28, 21d supply, fill #9
  Filled 2024-03-19: qty 28, 21d supply, fill #10
  Filled 2024-04-07: qty 28, 21d supply, fill #11

## 2023-08-24 ENCOUNTER — Encounter: Payer: Self-pay | Admitting: Family Medicine

## 2023-08-25 ENCOUNTER — Encounter: Payer: Self-pay | Admitting: Allergy and Immunology

## 2023-09-07 ENCOUNTER — Other Ambulatory Visit (HOSPITAL_COMMUNITY): Payer: Self-pay

## 2023-09-08 ENCOUNTER — Other Ambulatory Visit (HOSPITAL_COMMUNITY): Payer: Self-pay

## 2023-09-15 ENCOUNTER — Encounter: Payer: Self-pay | Admitting: Obstetrics and Gynecology

## 2023-09-27 ENCOUNTER — Other Ambulatory Visit (HOSPITAL_COMMUNITY): Payer: Self-pay

## 2023-10-02 ENCOUNTER — Emergency Department (HOSPITAL_BASED_OUTPATIENT_CLINIC_OR_DEPARTMENT_OTHER)
Admission: EM | Admit: 2023-10-02 | Discharge: 2023-10-02 | Disposition: A | Discharge status: Home / Self Care | Attending: Emergency Medicine | Admitting: Emergency Medicine

## 2023-10-02 ENCOUNTER — Encounter (HOSPITAL_BASED_OUTPATIENT_CLINIC_OR_DEPARTMENT_OTHER): Payer: Self-pay | Admitting: Emergency Medicine

## 2023-10-02 ENCOUNTER — Emergency Department (HOSPITAL_BASED_OUTPATIENT_CLINIC_OR_DEPARTMENT_OTHER): Admitting: Radiology

## 2023-10-02 ENCOUNTER — Emergency Department (HOSPITAL_BASED_OUTPATIENT_CLINIC_OR_DEPARTMENT_OTHER)

## 2023-10-02 ENCOUNTER — Other Ambulatory Visit: Payer: Self-pay

## 2023-10-02 DIAGNOSIS — X501XXA Overexertion from prolonged static or awkward postures, initial encounter: Secondary | ICD-10-CM | POA: Insufficient documentation

## 2023-10-02 DIAGNOSIS — S93402A Sprain of unspecified ligament of left ankle, initial encounter: Secondary | ICD-10-CM | POA: Insufficient documentation

## 2023-10-02 DIAGNOSIS — M25572 Pain in left ankle and joints of left foot: Secondary | ICD-10-CM | POA: Diagnosis not present

## 2023-10-02 DIAGNOSIS — M19072 Primary osteoarthritis, left ankle and foot: Secondary | ICD-10-CM | POA: Diagnosis not present

## 2023-10-02 DIAGNOSIS — M7989 Other specified soft tissue disorders: Secondary | ICD-10-CM | POA: Insufficient documentation

## 2023-10-02 DIAGNOSIS — Y9301 Activity, walking, marching and hiking: Secondary | ICD-10-CM | POA: Insufficient documentation

## 2023-10-02 DIAGNOSIS — S99912A Unspecified injury of left ankle, initial encounter: Secondary | ICD-10-CM | POA: Diagnosis present

## 2023-10-02 DIAGNOSIS — M79672 Pain in left foot: Secondary | ICD-10-CM | POA: Diagnosis not present

## 2023-10-02 MED ORDER — IBUPROFEN 800 MG PO TABS
800.0000 mg | ORAL_TABLET | Freq: Once | ORAL | Status: AC
  Administered 2023-10-02: 800 mg via ORAL
  Filled 2023-10-02: qty 1

## 2023-10-02 NOTE — ED Triage Notes (Signed)
 Pt via pov from home with left foot pain after turning her foot at work today. Pt states she is unable to bear weight on the foot. A&O x 4; nad noted.

## 2023-10-02 NOTE — Discharge Instructions (Signed)
 As we discussed, your workup in the ER today was reassuring for acute findings.  X-ray imaging did not reveal any fracture or dislocation.  Given this, I suspect that you have sustained a sprain.  For this, we have given you a walking boot and crutches to use as needed for support.  I recommend that you rest, ice, compress, and elevate your foot and take Tylenol /ibuprofen as needed for pain.  Please use acetaminophen  (Tylenol ) or ibuprofen (Advil, Motrin) for pain.  You may use 800 mg ibuprofen every 6 hours or 1000 mg of acetaminophen  every 6 hours.  You may choose to alternate between the two, this would be most effective. Do not exceed 4000 mg of acetaminophen  within 24 hours.  Do not exceed 3200 mg ibuprofen within 24 hours.  Additionally, I recommend that you follow-up with orthopedics for continued evaluation and management of this injury.  Please call to schedule an appointment.  I have given you a referral.  Return if development of any new or worsening symptoms.

## 2023-10-02 NOTE — ED Provider Notes (Signed)
 [place] EMERGENCY DEPARTMENT AT [facility] Provider Note   CSN: [id number] Arrival date & time: 10/02/23  1558     Patient presents with: Foot Pain   Jennifer Dean is a 43 y.o. female.   Patient with no pertinent past medical history presents today with complaints of left foot injury.  She states that same occurred when she was walking at her job and felt her foot roll in her shoe.  She did not fall down. Endorses pain to her left midfoot. Has not been able to walk since the event. Has not taken anything for pain. Denies any other injuries or complaints.   The history is provided by the patient. No language interpreter was used.  Foot Pain       Prior to Admission medications   Medication Sig Start Date End Date Taking? Authorizing Provider  albuterol  (VENTOLIN  HFA) 108 (90 Base) MCG/ACT inhaler Inhale 2 puffs into the lungs every 4 to 6 hours as needed for wheezing or shortness of breath 02/21/23   Kozlow, Camellia PARAS, MD  Albuterol -Budesonide  (AIRSUPRA ) 90-80 MCG/ACT AERO Inhale 2 Inhalations into the lungs every 4 (four) hours as needed. 08/22/23   Kozlow, Camellia PARAS, MD  Beclomethasone Dipropionate  80 MCG/ACT AERS Place 2 sprays into the nose daily. Patient not taking: Reported on 08/22/2023 02/21/23   Kozlow, Eric J, MD  diclofenac  (VOLTAREN ) 75 MG EC tablet Take 1 tablet (75 mg total) by mouth 2 (two) times daily. Patient not taking: Reported on 08/22/2023 05/16/23   Jodie Lavern CROME, MD  escitalopram  (LEXAPRO ) 20 MG tablet Take 1 tablet (20 mg total) by mouth daily. 09/22/22   Jodie Lavern CROME, MD  famotidine  (PEPCID ) 40 MG tablet Take 1 tablet (40 mg total) by mouth at bedtime. As needed 02/21/23   Kozlow, Camellia PARAS, MD  fluticasone  (FLONASE ) 50 MCG/ACT nasal spray Place 2 sprays into both nostrils daily. 08/22/23   Kozlow, Camellia PARAS, MD  fluticasone -salmeterol (WIXELA INHUB ) 250-50 MCG/ACT AEPB Inhale 1 puff into the lungs in the morning and at bedtime. 08/22/23   Kozlow, Camellia PARAS,  MD  lamoTRIgine  (LAMICTAL ) 100 MG tablet Take 1 tablet (100 mg total) by mouth daily. 05/16/23   Jodie Lavern CROME, MD  levocetirizine (XYZAL  ALLERGY 24HR) 5 MG tablet Take 1 tablet (5 mg total) by mouth daily as needed for allergies (Can take an extra dose during flare ups.). 02/21/23   Kozlow, Camellia PARAS, MD  levonorgestrel -ethinyl estradiol  (AVIANE) 0.1-20 MG-MCG tablet Take 1 tablet by mouth daily, continously. Can skip placebo for better menstrual control. 08/23/23   Ajewole, Christana, MD  omeprazole  (PRILOSEC) 40 MG capsule Take 1 capsule (40 mg total) by mouth 2 (two) times daily as needed. 08/22/23   Kozlow, Camellia PARAS, MD  TRI-LO-SPRINTEC 0.18/0.215/0.25 MG-25 MCG tab TAKE 1 TABLET BY MOUTH DAILY. 01/24/20 04/07/20  Jodie Lavern CROME, MD    Allergies: Morphine and codeine and Codeine    Review of Systems  Musculoskeletal:  Positive for arthralgias.  All other systems reviewed and are negative.   Updated Vital Signs BP (!) 157/95 (BP Location: Left Arm)   Pulse 84   Temp 98.6 F (37 C) (Oral)   Resp 16   Ht 5' 2 (1.575 m)   Wt 103 kg   LMP 09/25/2023 (Approximate)   SpO2 98%   BMI 41.53 kg/m   Physical Exam Vitals and nursing note reviewed.  Constitutional:      General: She is not in acute distress.  Appearance: Normal appearance. She is normal weight. She is not ill-appearing, toxic-appearing or diaphoretic.  HENT:     Head: Normocephalic and atraumatic.   Cardiovascular:     Rate and Rhythm: Normal rate.  Pulmonary:     Effort: Pulmonary effort is normal. No respiratory distress.   Musculoskeletal:        General: Normal range of motion.     Cervical back: Normal range of motion.     Comments: TTP noted to the lateral left foot and ankle. Mild swelling noted, no deformity. DP and PT pulses intact and 2+. Good capillary refill. ROM intact with pain.    Skin:    General: Skin is warm and dry.   Neurological:     General: No focal deficit present.     Mental Status: She  is alert.   Psychiatric:        Mood and Affect: Mood normal.        Behavior: Behavior normal.     (all labs ordered are listed, but only abnormal results are displayed) Labs Reviewed - No data to display  EKG: None  Radiology: DG Ankle Complete Left Result Date: 10/02/2023 CLINICAL DATA:  Pain after fall. EXAM: LEFT ANKLE COMPLETE - 3+ VIEW COMPARISON:  Foot radiograph earlier today FINDINGS: There is no evidence of fracture, dislocation, or joint effusion. Ankle mortise is preserved. There is no evidence of arthropathy or other focal bone abnormality. Soft tissue edema. IMPRESSION: Soft tissue edema without acute fracture or subluxation of the left ankle. Electronically Signed   By: Andrea Gasman M.D.   On: 10/02/2023 17:33   DG Foot Complete Left Result Date: 10/02/2023 CLINICAL DATA:  Left-sided foot pain EXAM: LEFT FOOT - COMPLETE 3+ VIEW COMPARISON:  None Available. FINDINGS: No acute fracture or malalignment. Mild degenerative changes at the first MTP joint. Soft tissues are unremarkable IMPRESSION: Mild degenerative changes at the first MTP joint. Electronically Signed   By: Luke Bun M.D.   On: 10/02/2023 16:54     Procedures   Medications Ordered in the ED  ibuprofen (ADVIL) tablet 800 mg (has no administration in time range)                                    Medical Decision Making Amount and/or Complexity of Data Reviewed Radiology: ordered.  Risk Prescription drug management.   Patient presents today with complaints of left ankle injury daily prior to arrival today.  She is afebrile, nontoxic-appearing, and in no acute distress reassuring vital signs.  Physical exam reveals TTP to the left lateral ankle and into the midfoot.  Mild swelling noted.  No deformity.  Good distal pulses and sensation.  ROM intact with discomfort.  X-ray imaging obtained which has resulted and reveals  Soft tissue edema without acute fracture or subluxation of the left  ankle.  I have personally reviewed and interpreted this imaging and agree with radiology interpretation.  Suspects patient has sustained a ankle sprain, discussed same with patient who is understanding and agreement.  Will provide a walking boot with crutches given her limitations with bearing weight.  Recommend she use these as needed follow-up with orthopedics.  Referral given for same.  Discussed NSAID/Tylenol  use and RICE. Evaluation and diagnostic testing in the emergency department does not suggest an emergent condition requiring admission or immediate intervention beyond what has been performed at this time.  Plan for discharge  with close PCP follow-up.  Patient is understanding and amenable with plan, educated on red flag symptoms that would prompt immediate return.  Patient discharged in stable condition.  Final diagnoses:  Sprain of left ankle, unspecified ligament, initial encounter    ED Discharge Orders     None     An After Visit Summary was printed and given to the patient.      Abri Vacca A, PA-C 10/02/23 1811    Towana Ozell BROCKS, MD 10/03/23 1012

## 2023-10-03 DIAGNOSIS — S93492A Sprain of other ligament of left ankle, initial encounter: Secondary | ICD-10-CM | POA: Diagnosis not present

## 2023-10-12 ENCOUNTER — Ambulatory Visit: Admitting: Podiatry

## 2023-10-12 ENCOUNTER — Ambulatory Visit (INDEPENDENT_AMBULATORY_CARE_PROVIDER_SITE_OTHER)

## 2023-10-12 ENCOUNTER — Encounter: Payer: Self-pay | Admitting: Podiatry

## 2023-10-12 VITALS — Ht 62.0 in | Wt 227.0 lb

## 2023-10-12 DIAGNOSIS — S99922A Unspecified injury of left foot, initial encounter: Secondary | ICD-10-CM

## 2023-10-12 DIAGNOSIS — R600 Localized edema: Secondary | ICD-10-CM

## 2023-10-13 NOTE — Progress Notes (Signed)
 Subjective:   Patient ID: Jennifer Dean, female   DOB: 43 y.o.   MRN: [id number]   HPI Patient states she injured her left foot around 10 days ago and she is wearing a boot currently but states it seemed to be improving and then it started to swell and hurt more again.  Patient stated that she turned the foot and patient does not smoke likes to be active   Review of Systems  All other systems reviewed and are negative.       Objective:  Physical Exam Vitals and nursing note reviewed.  Constitutional:      Appearance: She is well-developed.  Pulmonary:     Effort: Pulmonary effort is normal.  Musculoskeletal:        General: Normal range of motion.  Skin:    General: Skin is warm.  Neurological:     Mental Status: She is alert.     Neurovascular status found to be intact muscle strength was found to be adequate range of motion slightly reduced left side due to splinting.  There is moderate edema in the forefoot midfoot left with negative pitting and negative Toula' sign was noted.  There is quite a bit of pain in the lateral side of the foot that is not specific to one exact area and into the midfoot area.  Good digital perfusion well-oriented x 3     Assessment:  Probability for soft tissue injury left cannot rule out bone injury with edema of the foot secondary to this injury     Plan:  H&P x-ray reviewed and at this time I applied Unna boot Ace wrap to try to reduce some of the acute edema present.  Placed on diclofenac  after removal of this in approximately 3 days or earlier if it should cause her pain she can start doing ice therapy and continue boot for the next few weeks and reappoint if symptoms are not significantly better in the next several weeks  X-rays negative for signs of fracture negative for signs of any kind of dislocation injury

## 2023-10-19 ENCOUNTER — Other Ambulatory Visit (HOSPITAL_COMMUNITY): Payer: Self-pay

## 2023-10-31 ENCOUNTER — Encounter

## 2024-01-12 ENCOUNTER — Encounter: Payer: Self-pay | Admitting: Family Medicine

## 2024-01-12 ENCOUNTER — Ambulatory Visit: Payer: Commercial Managed Care - PPO | Admitting: Family Medicine

## 2024-01-12 VITALS — BP 122/84 | HR 69 | Temp 98.6°F | Ht 62.0 in | Wt 225.0 lb

## 2024-01-12 DIAGNOSIS — K219 Gastro-esophageal reflux disease without esophagitis: Secondary | ICD-10-CM

## 2024-01-12 DIAGNOSIS — Z0000 Encounter for general adult medical examination without abnormal findings: Secondary | ICD-10-CM | POA: Diagnosis not present

## 2024-01-12 DIAGNOSIS — F339 Major depressive disorder, recurrent, unspecified: Secondary | ICD-10-CM | POA: Diagnosis not present

## 2024-01-12 DIAGNOSIS — Z23 Encounter for immunization: Secondary | ICD-10-CM

## 2024-01-12 DIAGNOSIS — Z0001 Encounter for general adult medical examination with abnormal findings: Secondary | ICD-10-CM

## 2024-01-12 DIAGNOSIS — Z789 Other specified health status: Secondary | ICD-10-CM

## 2024-01-12 DIAGNOSIS — J4540 Moderate persistent asthma, uncomplicated: Secondary | ICD-10-CM

## 2024-01-12 LAB — CBC WITH DIFFERENTIAL/PLATELET
Basophils Absolute: 0 K/uL (ref 0.0–0.1)
Basophils Relative: 0.9 % (ref 0.0–3.0)
Eosinophils Absolute: 0.1 K/uL (ref 0.0–0.7)
Eosinophils Relative: 3 % (ref 0.0–5.0)
HCT: 41.4 % (ref 36.0–46.0)
Hemoglobin: 13.3 g/dL (ref 12.0–15.0)
Lymphocytes Relative: 46.3 % — ABNORMAL HIGH (ref 12.0–46.0)
Lymphs Abs: 1.8 K/uL (ref 0.7–4.0)
MCHC: 32.1 g/dL (ref 30.0–36.0)
MCV: 93.5 fl (ref 78.0–100.0)
Monocytes Absolute: 0.2 K/uL (ref 0.1–1.0)
Monocytes Relative: 5.8 % (ref 3.0–12.0)
Neutro Abs: 1.7 K/uL (ref 1.4–7.7)
Neutrophils Relative %: 44 % (ref 43.0–77.0)
Platelets: 362 K/uL (ref 150.0–400.0)
RBC: 4.43 Mil/uL (ref 3.87–5.11)
RDW: 14 % (ref 11.5–15.5)
WBC: 3.8 K/uL — ABNORMAL LOW (ref 4.0–10.5)

## 2024-01-12 LAB — COMPREHENSIVE METABOLIC PANEL WITH GFR
ALT: 14 U/L (ref 0–35)
AST: 18 U/L (ref 0–37)
Albumin: 4 g/dL (ref 3.5–5.2)
Alkaline Phosphatase: 68 U/L (ref 39–117)
BUN: 6 mg/dL (ref 6–23)
CO2: 25 meq/L (ref 19–32)
Calcium: 8.7 mg/dL (ref 8.4–10.5)
Chloride: 102 meq/L (ref 96–112)
Creatinine, Ser: 0.77 mg/dL (ref 0.40–1.20)
GFR: 94.89 mL/min (ref >60.00)
Glucose, Bld: 83 mg/dL (ref 70–99)
Potassium: 4.5 meq/L (ref 3.5–5.1)
Sodium: 136 meq/L (ref 135–145)
Total Bilirubin: 0.5 mg/dL (ref 0.2–1.2)
Total Protein: 7 g/dL (ref 6.0–8.3)

## 2024-01-12 LAB — HEMOGLOBIN A1C: Hgb A1c MFr Bld: 5.2 % (ref 4.6–6.5)

## 2024-01-12 LAB — TSH: TSH: 0.76 u[IU]/mL (ref 0.35–5.50)

## 2024-01-12 LAB — LIPID PANEL
Cholesterol: 157 mg/dL (ref 0–200)
HDL: 48.3 mg/dL (ref >39.00)
LDL Cholesterol: 92 mg/dL (ref 0–99)
NonHDL: 108.75
Total CHOL/HDL Ratio: 3
Triglycerides: 83 mg/dL (ref 0.0–149.0)
VLDL: 16.6 mg/dL (ref 0.0–40.0)

## 2024-01-12 NOTE — Patient Instructions (Signed)
 Please return in 12 months for your annual complete physical; please come fasting.  Can schedule an appointment for your Prevnar 20 vaccine at your convenience (pneumonia shot)  I will release your lab results to you on your MyChart account with further instructions. You may see the results before I do, but when I review them I will send you a message with my report or have my assistant call you if things need to be discussed. Please reply to my message with any questions. Thank you!   If you have any questions or concerns, please don't hesitate to send me a message via MyChart or call the office at [phone]. Thank you for visiting with us  today! It's our pleasure caring for you.    VISIT SUMMARY: During today's visit, we discussed your concerns about weight management, cognitive difficulties, menstrual irregularities, asthma, and general health maintenance. We explored various treatment options and strategies to address these issues, including medication, lifestyle modifications, and preventive care.  YOUR PLAN: -MORBID OBESITY DUE TO EXCESS CALORIES: Morbid obesity is a condition characterized by excessive body weight. We discussed the use of GLP-1 receptor agonists to aid in weight loss by improving glucose metabolism, insulin resistance, and reducing hunger. We also explored cost-effective options such as compounding pharmacies and emphasized starting with a low dose to minimize nausea. Additionally, we discussed the potential benefits of B12 in compounded medications to reduce nausea. Lifestyle modifications were encouraged to support weight loss.  -ASTHMA: Asthma is a condition that affects the airways and can cause breathing difficulties. We discussed the importance of managing asthma and recommended receiving a pneumonia booster vaccine at your next visit to prevent respiratory infections.  -ADULT WELLNESS VISIT: During your routine adult wellness visit, we reviewed general health  maintenance, including scheduling a mammogram for November and administering a pneumonia booster vaccine at your next visit. We also discussed your current birth control regimen and the potential benefits of cycling it every three months to reduce the risk of breakthrough bleeding.  INSTRUCTIONS: Please schedule a mammogram for November and plan to receive the pneumonia booster vaccine at your next visit. Additionally, consider cycling your birth control every three months to reduce the risk of breakthrough bleeding.                      Contains text generated by Abridge.                                 Contains text generated by Abridge.
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
## 2024-01-22 ENCOUNTER — Ambulatory Visit: Payer: Self-pay | Admitting: Family Medicine

## 2024-01-22 NOTE — Progress Notes (Signed)
 See mychart note Dear Ms. Karrie, Your lab results look good! Sincerely, Dr. Jodie

## 2024-02-13 ENCOUNTER — Ambulatory Visit (INDEPENDENT_AMBULATORY_CARE_PROVIDER_SITE_OTHER)

## 2024-02-13 DIAGNOSIS — Z23 Encounter for immunization: Secondary | ICD-10-CM | POA: Diagnosis not present

## 2024-02-13 NOTE — Progress Notes (Signed)
 Patient is in office today for a nurse visit for Prevnar 20 injection. Patient Injection was given in the  Left deltoid. Patient tolerated injection well. No other question s or concerns were addressed during this visit.

## 2024-02-26 ENCOUNTER — Other Ambulatory Visit (HOSPITAL_COMMUNITY): Payer: Self-pay

## 2024-02-26 IMAGING — MG MM DIGITAL SCREENING BILAT W/ TOMO AND CAD
8 series · 8 of 24 positions shown · non-contrast
Comparison: None.

CLINICAL DATA: Screening.

EXAM:
DIGITAL SCREENING BILATERAL MAMMOGRAM WITH TOMOSYNTHESIS AND CAD
TECHNIQUE: Bilateral screening digital craniocaudal and mediolateral oblique
mammograms were obtained. Bilateral screening digital breast
tomosynthesis was performed. The images were evaluated with
computer-aided detection.

[L MLO synth-2D]
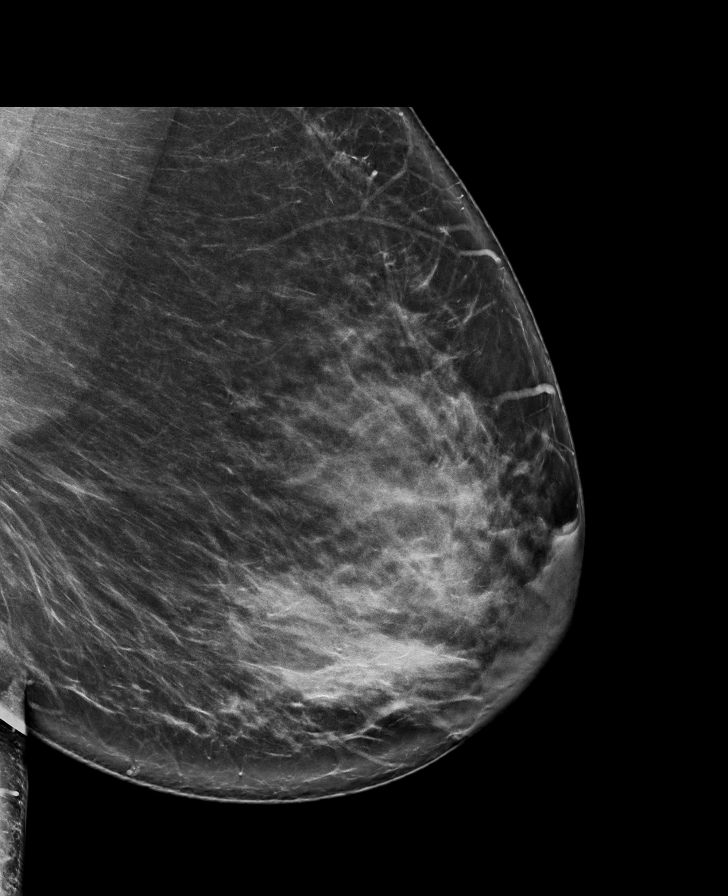

[R CC synth-2D]
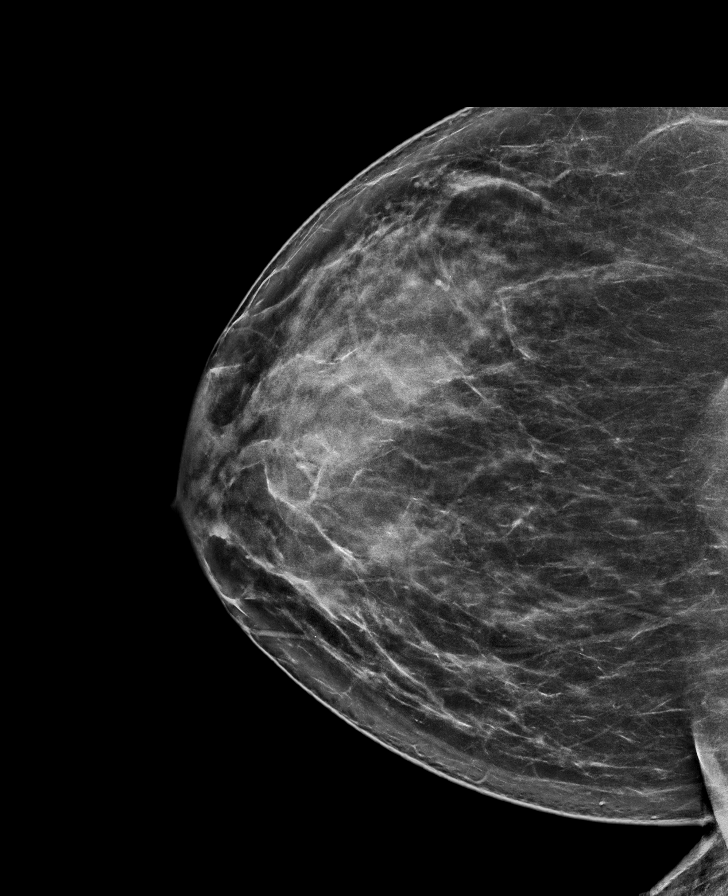

[L CC synth-2D]
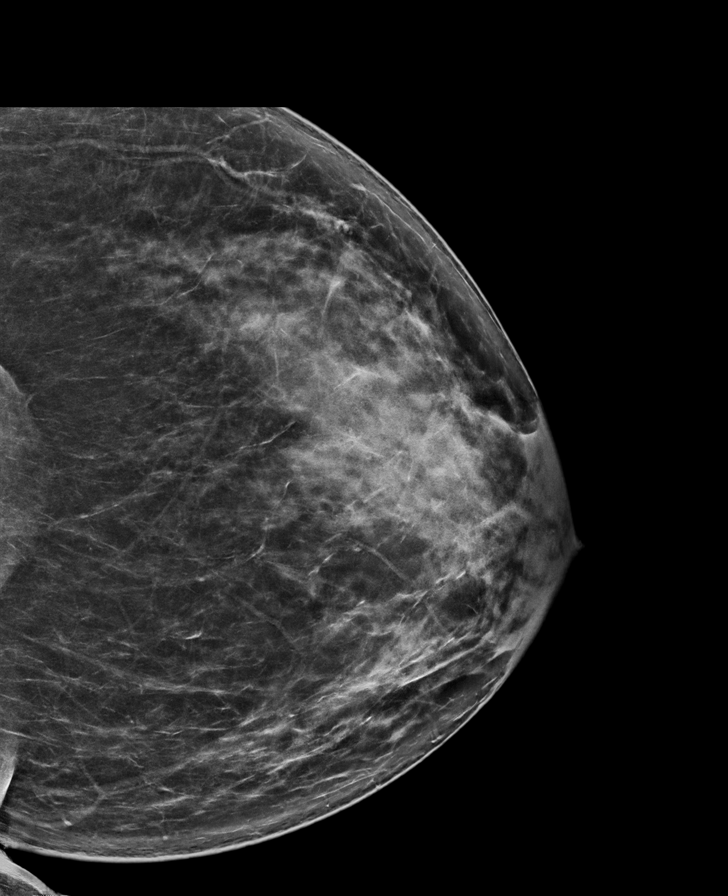

[R MLO synth-2D]
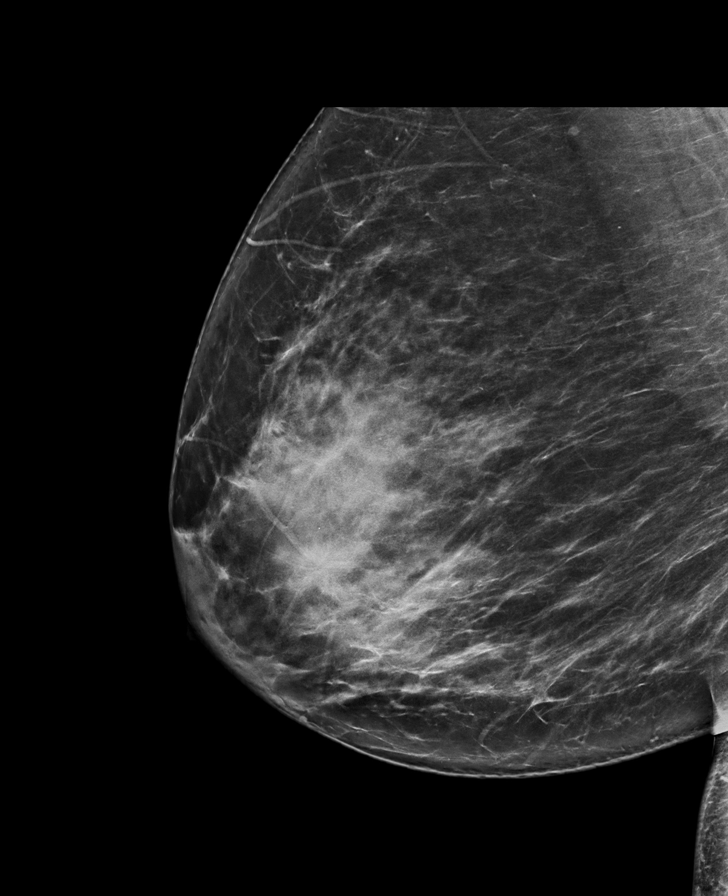

[L CC tomo · tomo slice 45/88.0]
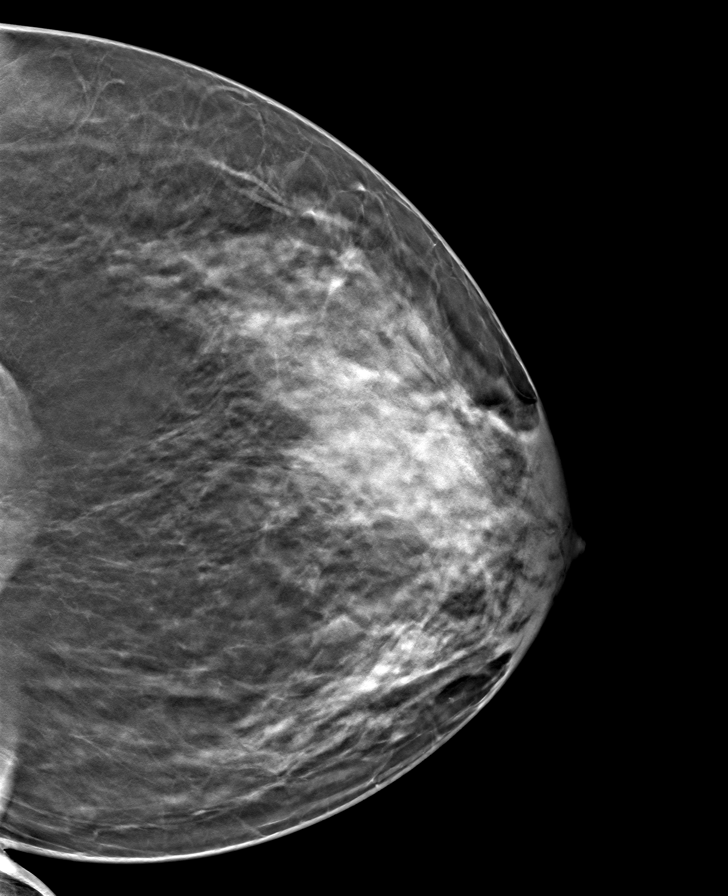

[R MLO tomo · tomo slice 45/90.0]
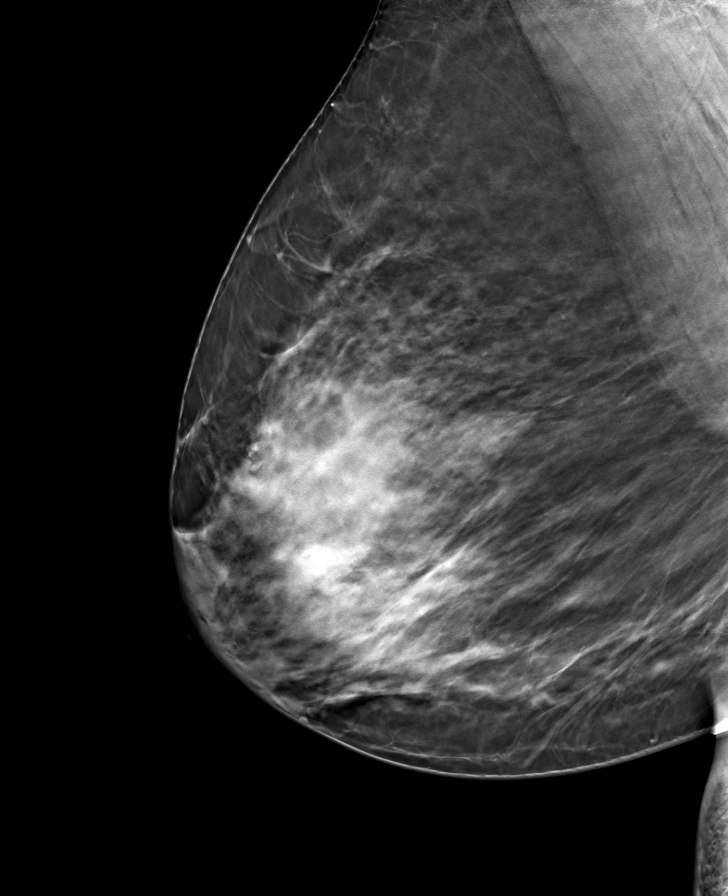

[R CC tomo · tomo slice 45/90.0]
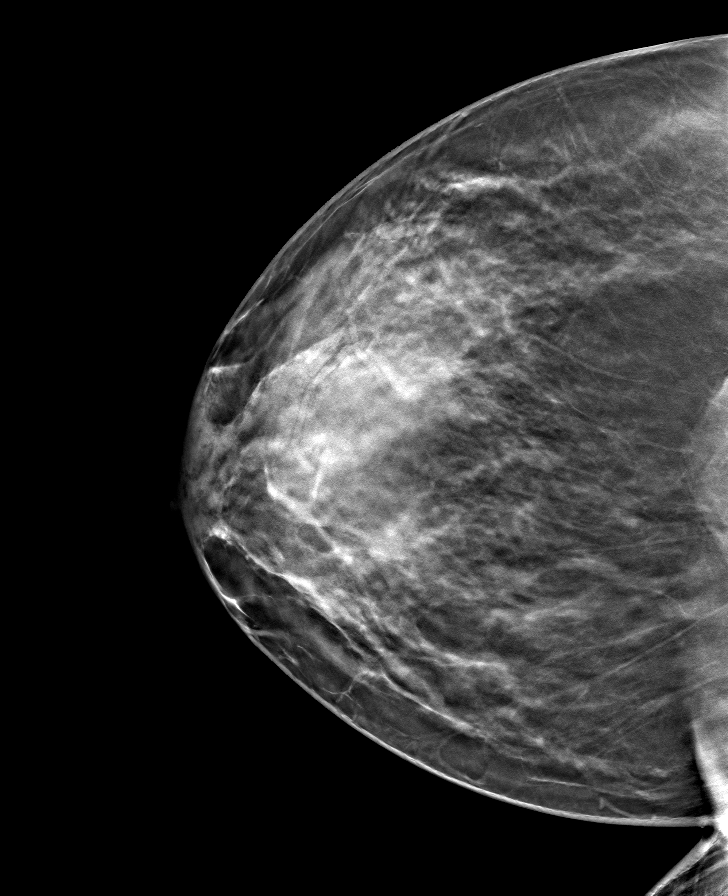

[L MLO tomo · tomo slice 49/96.0]
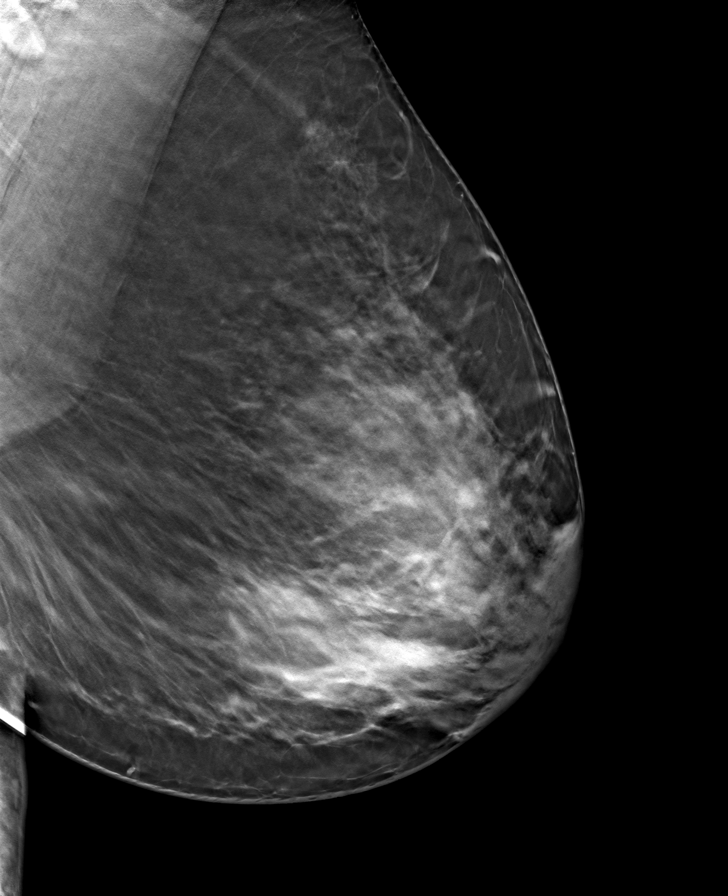

[8 of 24 positions shown; findings below may reference images not displayed]

ACR Breast Density Category c: The breast tissue is heterogeneously
dense, which may obscure small masses
FINDINGS: There are no findings suspicious for malignancy.
IMPRESSION: No mammographic evidence of malignancy. A result letter of this
screening mammogram will be mailed directly to the patient.

RECOMMENDATION:
Screening mammogram in one year. (Code:C8-T-HNK)

BI-RADS CATEGORY  1: Negative.

## 2024-02-27 ENCOUNTER — Encounter: Payer: Self-pay | Admitting: Allergy and Immunology

## 2024-02-27 ENCOUNTER — Ambulatory Visit: Admitting: Allergy and Immunology

## 2024-02-27 ENCOUNTER — Other Ambulatory Visit: Payer: Self-pay

## 2024-02-27 VITALS — BP 110/74 | Temp 98.1°F | Ht 62.0 in | Wt 229.1 lb

## 2024-02-27 DIAGNOSIS — J301 Allergic rhinitis due to pollen: Secondary | ICD-10-CM | POA: Diagnosis not present

## 2024-02-27 DIAGNOSIS — J3089 Other allergic rhinitis: Secondary | ICD-10-CM

## 2024-02-27 DIAGNOSIS — K219 Gastro-esophageal reflux disease without esophagitis: Secondary | ICD-10-CM | POA: Diagnosis not present

## 2024-02-27 DIAGNOSIS — J4540 Moderate persistent asthma, uncomplicated: Secondary | ICD-10-CM

## 2024-02-27 NOTE — Patient Instructions (Addendum)
  1.  Allergen avoidance measures - dog, grass, tree, mold  2.  Continue to treat and prevent inflammation:  A. Wixela 250 -1 inhalation 1-2 times per day  B. Fluticasone  - 1-2 sprays each nostril 1 time per day    3. Treat and prevent reflux / LPR:   A. Omeprazole  40 mg - 1 tablet 1-2 times per day  B. Always minimize caffeine and chocolate consumption  D. Replace throat clearing with drinking/swallowing maneuver  4.  If needed:   A.  AIRSUPRA  - 2 inhalations every 4-6 hours (Coupon)  B.  OTC nasal saline  C.  OTC antihistamine  5. Return to clinic in 12 months or earlier if problem  6. Influenza = Tamiflu. Covid = Paxlovid

## 2024-02-27 NOTE — Progress Notes (Unsigned)
 [place] - [place] - [place] - [place] - [place]   Follow-up Note  Referring Provider: Jodie Lavern CROME, MD Primary Provider: Jodie Lavern CROME, MD Date of Office Visit: 02/27/2024  Subjective:   Jennifer Dean (DOB: 1980-04-15) is a 43 y.o. female who [facility] in re-evaluation of the following:  HPI: Jennifer Dean returns to this clinic in evaluation of asthma, allergic rhinitis, LPR.  I last saw her in this clinic 22 Aug 2023.  She has really done well since her last visit regarding control of her asthma and her allergic rhinitis.  She has not required a systemic steroid or an antibiotic to treat any type of airway issue.  She rarely uses a short acting bronchodilator and can exert herself without any problem.  She can smell and taste with no issue.  She has been using her Wixela mostly 1 time per day and using her nasal fluticasone  1 time per day.  And she has been using her omeprazole  1-2 times per day depending on disease activity.  She does eat some chocolate but tends to remain away from caffeine consumption.  Allergies as of 02/27/2024       Reactions   Morphine And Codeine Nausea And Vomiting   Codeine Anxiety   Dry mouth, uneasy feeling        Medication List    Airsupra  90-80 MCG/ACT Aero Generic drug: Albuterol -Budesonide  Inhale 2 Inhalations into the lungs every 4 (four) hours as needed.   albuterol  108 (90 Base) MCG/ACT inhaler Commonly known as: VENTOLIN  HFA Inhale 2 puffs into the lungs every 4 to 6 hours as needed for wheezing or shortness of breath   azelastine  0.1 % nasal spray Commonly known as: ASTELIN  Place 1 spray into the nose.   escitalopram  20 MG tablet Commonly known as: LEXAPRO  Take 1 tablet (20 mg total) by mouth daily.   fluticasone  50 MCG/ACT nasal spray Commonly known as: FLONASE  Place 2 sprays into both nostrils daily.   fluticasone -salmeterol 250-50 MCG/ACT Aepb Commonly known  as: Wixela Inhub  Inhale 1 puff into the lungs in the morning and at bedtime.   lamoTRIgine  100 MG tablet Commonly known as: LaMICtal  Take 1 tablet (100 mg total) by mouth daily.   levocetirizine 5 MG tablet Commonly known as: Xyzal  Allergy 24HR Take 1 tablet (5 mg total) by mouth daily as needed for allergies (Can take an extra dose during flare ups.).   omeprazole  40 MG capsule Commonly known as: PRILOSEC Take 1 capsule (40 mg total) by mouth 2 (two) times daily as needed.   Vienva  0.1-20 MG-MCG tablet Generic drug: levonorgestrel -ethinyl estradiol  Take 1 tablet by mouth daily, continously. Can skip placebo for better menstrual control.    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   HENT: Negative.    Eyes: Negative.   Respiratory: Negative.  Cardiovascular: Negative.   Gastrointestinal: Negative.   Genitourinary: Negative.   Musculoskeletal: Negative.   Skin: Negative.   Neurological: Negative.   Endo/Heme/Allergies: Negative.   Psychiatric/Behavioral: Negative.       Objective:   Vitals:   02/27/24 1040  BP: 110/74  Temp: 98.1 F (36.7 C)   Height: 5' 2 (157.5 cm)  Weight: 229 lb 1.6 oz (103.9 kg)   Physical Exam Constitutional:      Appearance: She is not diaphoretic.  HENT:     Head: Normocephalic.     Right Ear: Tympanic membrane, ear canal and external ear normal.     Left Ear: Tympanic membrane, ear canal and external ear normal.     Nose:  Nose normal. No mucosal edema or rhinorrhea.     Mouth/Throat:     Pharynx: Uvula midline. No oropharyngeal exudate.  Eyes:     Conjunctiva/sclera: Conjunctivae normal.  Neck:     Thyroid : No thyromegaly.     Trachea: Trachea normal. No tracheal tenderness or tracheal deviation.  Cardiovascular:     Rate and Rhythm: Normal rate and regular rhythm.     Heart sounds: Normal heart sounds, S1 normal and S2 normal. No murmur heard. Pulmonary:     Effort: No respiratory distress.     Breath sounds: Normal breath sounds. No stridor. No wheezing or rales.  Lymphadenopathy:     Head:     Right side of head: No tonsillar adenopathy.     Left side of head: No tonsillar adenopathy.     Cervical: No cervical adenopathy.  Skin:    Findings: No erythema or rash.     Nails: There is no clubbing.  Neurological:     Mental Status: She is alert.     Diagnostics: Spirometry was performed and demonstrated an FEV1 of 1.84 at 78 % of predicted.  Assessment and Plan:   1. Asthma, moderate persistent, well-controlled   2. Perennial allergic rhinitis   3. Seasonal allergic rhinitis due to pollen   4. LPRD (laryngopharyngeal reflux disease)    1.  Allergen avoidance measures - dog, grass, tree, mold  2.  Continue to treat and prevent inflammation:  A. Wixela 250 -1 inhalation 1-2 times per day  B. Fluticasone  - 1-2 sprays each nostril 1 time per day    3. Treat and prevent reflux / LPR:   A. Omeprazole  40 mg - 1 tablet 1-2 times per day  B. Always minimize caffeine and chocolate consumption  D. Replace throat clearing with drinking/swallowing maneuver  4.  If needed:   A.  AIRSUPRA  - 2 inhalations every 4-6 hours (Coupon)  B.  OTC nasal saline  C.  OTC antihistamine  5. Return to clinic in 12 months or earlier if problem  6. Influenza = Tamiflu. Covid = Paxlovid  Jennifer Dean appears to be doing quite well on her current plan.  She has a very good understanding of her disease state and how  her medications work and appropriate dosing of her medications depending on disease activity and we will now see her back in this clinic in 12 months while she continues on the plan noted above to address airway inflammation and LPR.  If there is a problem over the course of the next 12 months while utilizing this plan she will contact us  during the interval.  Camellia Denis, MD Allergy / Immunology [facility]

## 2024-02-28 ENCOUNTER — Encounter: Payer: Self-pay | Admitting: Allergy and Immunology

## 2024-03-25 ENCOUNTER — Ambulatory Visit: Payer: Self-pay

## 2024-03-25 NOTE — Telephone Encounter (Signed)
 FYI Only or Action Required?: FYI only for provider: appointment scheduled on 03/26/24.  Patient was last seen in primary care on 01/12/2024 by Jodie Lavern CROME, MD.  Called Nurse Triage reporting Generalized Body Aches and Cough.  Symptoms began several days ago.  Interventions attempted: OTC medications: Ibuprofen , Mucinex.  Symptoms are: unchanged.  Triage Disposition: Call PCP Within 24 Hours  Patient/caregiver understands and will follow disposition?: Yes  Reason for Disposition  Patient is HIGH RISK (e.g., 65 years and older, pregnant, HIV+, or chronic medical condition)  Answer Assessment - Initial Assessment Questions Patient states that she believes she has the flu from her daughter. She has cough with clear yellow sputum, chest pain with cough, body aches, and sweats. She reports that she has asthma and is not currently experiencing any SOB, but has been using her inhaler prophylactically. Office visit advised.   1. SYMPTOMS: What is your main symptom or concern? (e.g., cough, fever, shortness of breath, muscle aches)     Cough   2. ONSET: When did the symptoms start?      Saturday evening  3. COUGH: Do you have a cough? If Yes, ask: How bad is the cough?       Moderate-severe  4. FEVER: Do you have a fever? If Yes, ask: What is your temperature, how was it measured, and when did it start?     Has had the sweats, but no actual fever  5. BREATHING DIFFICULTY: Are you having any difficulty breathing? (e.g., normal; shortness of breath, wheezing, unable to speak)      Denies SOB, wheezing with cough  6. BETTER-SAME-WORSE: Are you getting better, staying the same or getting worse compared to yesterday?  If getting worse, ask, In what way?     About the same  7. OTHER SYMPTOMS: Do you have any other symptoms?  (e.g., chills, fatigue, headache, loss of smell or taste, muscle pain, sore throat)     Fatigue, sweats, headache, body aches  8. INFLUENZA  EXPOSURE: Was there any known exposure to influenza (flu) before the symptoms began?      Yes, daughter had the flu then patient and son developed similar symptoms  9. INFLUENZA SUSPECTED: Why do you think you have influenza? (e.g., positive flu self-test at home, symptoms after exposure).     Symptoms after exposure  10. INFLUENZA VACCINE: Have you had the flu vaccine? If Yes, ask: When did you last get it?       Yes  11. HIGH RISK FOR COMPLICATIONS: Do you have any chronic medical problems? (e.g., asthma, heart or lung disease, obesity, weak immune system)       Asthma  12. PREGNANCY: Is there any chance you are pregnant? When was your last menstrual period?       No  13. O2 SATURATION MONITOR:  Do you use an oxygen saturation monitor (pulse oximeter) at home? If Yes, ask What is your reading (oxygen level) today? What is your usual oxygen saturation reading? (e.g., 95%)       No  Protocols used: Influenza (Flu) Suspected-A-AH  Copied from CRM #[id number]. Topic: Clinical - Red Word Triage >> Mar 25, 2024  4:20 PM Alfonso ORN wrote: Red Word that prompted transfer to Nurse Triage: flu like symptoms : sweats , body aches , when pt coughs mucus it hurts and opaque yellow last time was given steroid shot unsure if she should continue with OTC or be seen

## 2024-03-26 ENCOUNTER — Ambulatory Visit: Admitting: Family Medicine

## 2024-03-26 ENCOUNTER — Other Ambulatory Visit (HOSPITAL_COMMUNITY): Payer: Self-pay

## 2024-03-26 ENCOUNTER — Encounter: Payer: Self-pay | Admitting: Family Medicine

## 2024-03-26 DIAGNOSIS — J069 Acute upper respiratory infection, unspecified: Secondary | ICD-10-CM | POA: Diagnosis not present

## 2024-03-26 MED ORDER — METHYLPREDNISOLONE 4 MG PO TBPK
ORAL_TABLET | ORAL | 0 refills | Status: AC
  Filled 2024-03-26: qty 21, 6d supply, fill #0

## 2024-03-26 MED ORDER — IPRATROPIUM BROMIDE 0.03 % NA SOLN
2.0000 | Freq: Two times a day (BID) | NASAL | 12 refills | Status: AC
  Filled 2024-03-26: qty 30, 30d supply, fill #0

## 2024-03-26 NOTE — Patient Instructions (Signed)
 VISIT SUMMARY:  Today, you were seen for flu-like symptoms and dehydration. You have been feeling very fatigued, experiencing muscle aches, and congestion since Saturday. You also reported difficulty staying hydrated and some breathing issues related to your asthma. We discussed your symptoms and created a plan to help you feel better.  YOUR PLAN:  -VIRAL UPPER RESPIRATORY INFECTION: A viral upper respiratory infection is a common illness caused by a virus, leading to symptoms like congestion, fatigue, and muscle aches. You likely contracted this from your daughter. We prescribed a steroid pack to help reduce your symptoms, advised you to alternate acetaminophen  and ibuprofen  for muscle aches, and prescribed Atrovent  nasal spray for congestion.  -ASTHMA: Asthma is a condition that affects your airways and can cause difficulty breathing. Your asthma symptoms are likely worse due to the viral infection. We prescribed a steroid pack to help with your asthma symptoms and advised you to use your rescue inhaler if needed. If your symptoms do not improve with the steroids, please call us  for further evaluation.  -DEHYDRATION: Dehydration occurs when your body loses more fluids than it takes in, leading to symptoms like lightheadedness and fatigue. We advised you to continue oral rehydration with Gatorade and Pedialyte. If you find it difficult to stay hydrated, you may need IV fluids, so monitor your symptoms closely.  INSTRUCTIONS:  Please follow the prescribed treatments and monitor your symptoms closely. If you need to use your rescue inhaler despite taking the steroids, or if you cannot stay hydrated, contact us  immediately for further evaluation. Stay hydrated and rest as much as possible to aid your recovery.

## 2024-03-26 NOTE — Progress Notes (Signed)
 "  Name: Jennifer Dean   Date of Visit: 03/26/2024   Date of last visit with me: Visit date not found   CHIEF COMPLAINT:  Chief Complaint  Patient presents with   Acute Visit    Daughter got the flu, dehydrated, SOB, light headed, body aches, cough, headaches, sinus pressure. Symptoms started Saturday.        HPI:  Discussed the use of AI scribe software for clinical note transcription with the patient, who gave verbal consent to proceed.  History of Present Illness   Jennifer Dean is a 43 year old female with asthma who presents with flu-like symptoms and dehydration.  She has been experiencing flu-like symptoms since Saturday, following her daughter's illness that began on Thursday. Her symptoms include significant fatigue, myalgia, and congestion. She is concerned about the impact of these symptoms on her Christmas plans. Although she has not tested positive for influenza, she describes her condition as 'crappy'.  She reports dehydration over the past few weeks and has difficulty tolerating drinks. Taking a shower exacerbates her symptoms. She is attempting oral rehydration with Gatorade and Pedialyte.  She has a history of asthma and is experiencing dyspnea, which she attributes to her asthma. 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  Her ears felt full this morning.         OBJECTIVE:       01/12/2024    9:50 AM  Depression screen PHQ 2/9  Decreased Interest 2  Down, Depressed, Hopeless 2  PHQ - 2 Score 4  Altered sleeping 2  Tired, decreased energy 3  Change in appetite 2  Feeling bad or failure about yourself  1  Trouble concentrating 1  Moving slowly or fidgety/restless 0  Suicidal thoughts 0  PHQ-9 Score 13   Difficult doing work/chores Extremely dIfficult     Data saved with a previous flowsheet row definition     BP Readings from Last 3 Encounters:  03/26/24 134/88  02/27/24 110/74   01/12/24 122/84    BP 134/88   Pulse (!) 101   Wt 228 lb 6.4 oz (103.6 kg)   SpO2 97%   BMI 41.77 kg/m    Physical Exam          Physical Exam Constitutional:      Appearance: Normal appearance.  Cardiovascular:     Rate and Rhythm: Normal rate and regular rhythm.     Pulses: Normal pulses.     Heart sounds: Normal heart sounds.  Neurological:     General: No focal deficit present.     Mental Status: She is alert and oriented to person, place, and time. Mental status is at baseline.     ASSESSMENT/PLAN:   Assessment & Plan Viral URI    Assessment and Plan    Viral upper respiratory infection Symptoms consistent with viral URI, likely contracted from daughter with flu A. No positive flu A test, but significant symptoms warrant treatment. - Prescribed steroid pack to reduce immune response and alleviate symptoms. - Advised alternating acetaminophen  and ibuprofen  for muscle aches with dosing instructions. - Prescribed Atrovent  nasal spray for congestion.  Asthma Exacerbation likely due to viral infection. Steroid treatment expected to alleviate symptoms. - Prescribed steroid pack for asthma symptoms. - Advised use of rescue inhaler if needed, expect improvement with steroids. - Instructed to call if rescue inhaler is needed despite steroids for further evaluation.  Dehydration Reports dehydration with difficulty tolerating drinks.  Stable blood pressure indicates no severe dehydration. - Advised oral rehydration with Gatorade and Pedialyte. - Instructed to monitor symptoms and consider IV fluids if oral rehydration not tolerated.         Burnis Halling A. Vita MD [facility] "

## 2024-03-26 NOTE — Telephone Encounter (Signed)
 Noted

## 2024-03-28 ENCOUNTER — Other Ambulatory Visit: Payer: Self-pay

## 2024-03-28 ENCOUNTER — Encounter (HOSPITAL_BASED_OUTPATIENT_CLINIC_OR_DEPARTMENT_OTHER): Payer: Self-pay

## 2024-03-28 ENCOUNTER — Emergency Department (HOSPITAL_BASED_OUTPATIENT_CLINIC_OR_DEPARTMENT_OTHER): Admitting: Radiology

## 2024-03-28 ENCOUNTER — Emergency Department (HOSPITAL_BASED_OUTPATIENT_CLINIC_OR_DEPARTMENT_OTHER)
Admission: EM | Admit: 2024-03-28 | Discharge: 2024-03-28 | Disposition: A | Discharge status: Home / Self Care | Attending: Emergency Medicine | Admitting: Emergency Medicine

## 2024-03-28 DIAGNOSIS — R059 Cough, unspecified: Secondary | ICD-10-CM | POA: Diagnosis present

## 2024-03-28 DIAGNOSIS — J4540 Moderate persistent asthma, uncomplicated: Secondary | ICD-10-CM | POA: Diagnosis not present

## 2024-03-28 DIAGNOSIS — J101 Influenza due to other identified influenza virus with other respiratory manifestations: Secondary | ICD-10-CM | POA: Insufficient documentation

## 2024-03-28 DIAGNOSIS — J4541 Moderate persistent asthma with (acute) exacerbation: Secondary | ICD-10-CM | POA: Diagnosis not present

## 2024-03-28 LAB — RESP PANEL BY RT-PCR (RSV, FLU A&B, COVID)  RVPGX2
Influenza A by PCR: POSITIVE — AB
Influenza B by PCR: NEGATIVE
Resp Syncytial Virus by PCR: NEGATIVE
SARS Coronavirus 2 by RT PCR: NEGATIVE

## 2024-03-28 MED ORDER — IPRATROPIUM-ALBUTEROL 0.5-2.5 (3) MG/3ML IN SOLN
3.0000 mL | Freq: Once | RESPIRATORY_TRACT | Status: AC
  Administered 2024-03-28: 3 mL via RESPIRATORY_TRACT
  Filled 2024-03-28: qty 3

## 2024-03-28 MED ORDER — DEXAMETHASONE SOD PHOSPHATE PF 10 MG/ML IJ SOLN
10.0000 mg | Freq: Once | INTRAMUSCULAR | Status: AC
  Administered 2024-03-28: 10 mg via INTRAMUSCULAR

## 2024-03-28 MED ORDER — ALBUTEROL SULFATE HFA 108 (90 BASE) MCG/ACT IN AERS
2.0000 | INHALATION_SPRAY | Freq: Once | RESPIRATORY_TRACT | Status: AC
  Administered 2024-03-28: 2 via RESPIRATORY_TRACT
  Filled 2024-03-28: qty 6.7

## 2024-03-28 NOTE — ED Notes (Signed)
 Patient was educated on the proper administration of the albuterol  MDI.Patient only uses as needed.

## 2024-03-28 NOTE — ED Triage Notes (Signed)
 Pt c/o URI symptoms onset Saturday that's worsening underlying asthma. Advises it seems like when I take the meds I was prescribed Tuesday (steroid pack) worsened cough

## 2024-03-28 NOTE — Discharge Instructions (Signed)
 1.  You do have influenza.  This is likely making your asthma worse.  You have been given a shot of Decadron  in emergency department.  This is a steroid that continues to work for several days.  You do not need to continue the oral Medrol  Dosepak you were prescribed. 2.  You have been provided an albuterol  inhaler from the emergency department.  Use this every 4 hours for the next couple of days until your symptoms are improving.  Then you may go back to your as needed use. 3.  The nasal ipratropium that you were prescribed can help with allergic nasal congestion and drainage but will not help you with your asthma. 4.  Contact your family doctor and or pulmonologist for recheck within the next week.  Return to the emergency department if you have worsening difficulty breathing cannot stay hydrated or other concerning changes.

## 2024-03-28 NOTE — ED Provider Notes (Signed)
 " [place] EMERGENCY DEPARTMENT AT [facility] Provider Note   CSN: [id number] Arrival date & time: 03/28/24  1416     Patient presents with: URI   Jennifer Dean is a 43 y.o. female.   HPI Reports she started getting sick with chills aches and headache Saturday, 5 days ago.  Patient reports that her asthma has been flaring.  She reports that she had tested negative for flu previously and was started on a Medrol  dose pack and ipratropium inhaler twice daily.  She feels that the asthma symptoms are getting worse with taking the inhaler.  She reports she is coughing a lot.  She reports she feels short of breath with exertion.    Prior to Admission medications  Medication Sig Start Date End Date Taking? Authorizing Provider  albuterol  (VENTOLIN  HFA) 108 (90 Base) MCG/ACT inhaler Inhale 2 puffs into the lungs every 4 to 6 hours as needed for wheezing or shortness of breath Patient not taking: Reported on 03/26/2024 02/21/23   Kozlow, Eric J, MD  Albuterol -Budesonide  (AIRSUPRA ) 90-80 MCG/ACT AERO Inhale 2 Inhalations into the lungs every 4 (four) hours as needed. 08/22/23   Kozlow, Camellia PARAS, MD  azelastine  (ASTELIN ) 0.1 % nasal spray Place 1 spray into the nose. Patient not taking: Reported on 03/26/2024 07/04/16   [provider]  escitalopram  (LEXAPRO ) 20 MG tablet Take 1 tablet (20 mg total) by mouth daily. 09/22/22   Jodie Lavern CROME, MD  fluticasone  (FLONASE ) 50 MCG/ACT nasal spray Place 2 sprays into both nostrils daily. 08/22/23   Kozlow, Camellia PARAS, MD  fluticasone -salmeterol (WIXELA INHUB ) 250-50 MCG/ACT AEPB Inhale 1 puff into the lungs in the morning and at bedtime. 08/22/23   Kozlow, Eric J, MD  ipratropium (ATROVENT ) 0.03 % nasal spray Place 2 sprays into both nostrils every 12 (twelve) hours. 03/26/24   Jha, Panav, MD  lamoTRIgine  (LAMICTAL ) 100 MG tablet Take 1 tablet (100 mg total) by mouth daily. 05/16/23   Jodie Lavern CROME, MD  levocetirizine (XYZAL  ALLERGY 24HR) 5  MG tablet Take 1 tablet (5 mg total) by mouth daily as needed for allergies (Can take an extra dose during flare ups.). 02/21/23   Kozlow, Camellia PARAS, MD  levonorgestrel -ethinyl estradiol  (AVIANE) 0.1-20 MG-MCG tablet Take 1 tablet by mouth daily, continously. Can skip placebo for better menstrual control. 08/23/23   Ajewole, Christana, MD  methylPREDNISolone  (MEDROL  DOSEPAK) 4 MG TBPK tablet Take 6 tablets (24 mg total) by mouth daily for 1 day, THEN 5 tablets (20 mg total) daily for 1 day, THEN 4 tablets (16 mg total) daily for 1 day, THEN 3 tablets (12 mg total) daily for 1 day, THEN 2 tablets (8 mg total) daily for 1 day, THEN 1 tablet (4 mg total) daily for 1 day. 03/26/24 04/01/24  Jha, Panav, MD  omeprazole  (PRILOSEC) 40 MG capsule Take 1 capsule (40 mg total) by mouth 2 (two) times daily as needed. 08/22/23   Kozlow, Camellia PARAS, MD  TRI-LO-SPRINTEC 0.18/0.215/0.25 MG-25 MCG tab TAKE 1 TABLET BY MOUTH DAILY. 01/24/20 04/07/20  Jodie Lavern CROME, MD    Allergies: Morphine and codeine and Codeine    Review of Systems  Updated Vital Signs BP (!) 160/102 (BP Location: Right Arm)   Pulse 78   Temp 99 F (37.2 C) (Oral)   Resp 20   SpO2 100%   Physical Exam Constitutional:      Comments: Alert nontoxic.  No respiratory distress.  Mental status clear.  HENT:  Head: Normocephalic and atraumatic.     Mouth/Throat:     Pharynx: Oropharynx is clear.  Eyes:     Extraocular Movements: Extraocular movements intact.  Cardiovascular:     Rate and Rhythm: Normal rate and regular rhythm.  Pulmonary:     Comments: No respiratory distress.  Taking full sentences.  With deep inspiration goes into cough paroxysm.  Expiratory wheeze in lower lung fields. Abdominal:     General: There is no distension.     Palpations: Abdomen is soft.     Tenderness: There is no abdominal tenderness. There is no guarding.  Musculoskeletal:        General: No swelling or tenderness. Normal range of motion.     Right lower  leg: No edema.     Left lower leg: No edema.  Skin:    General: Skin is warm and dry.  Neurological:     General: No focal deficit present.     Mental Status: She is oriented to person, place, and time.     Motor: No weakness.     Coordination: Coordination normal.  Psychiatric:        Mood and Affect: Mood normal.     (all labs ordered are listed, but only abnormal results are displayed) Labs Reviewed  RESP PANEL BY RT-PCR (RSV, FLU A&B, COVID)  RVPGX2 - Abnormal; Notable for the following components:      Result Value   Influenza A by PCR POSITIVE (*)    All other components within normal limits    EKG: None  Radiology: DG Chest 2 View Result Date: 03/28/2024 CLINICAL DATA:  Flu A positive with history of asthma. EXAM: CHEST - 2 VIEW COMPARISON:  March 09, 2023 FINDINGS: The heart size and mediastinal contours are within normal limits. Both lungs are clear. The visualized skeletal structures are unremarkable. IMPRESSION: No active cardiopulmonary disease. Electronically Signed   By: Suzen Dials M.D.   On: 03/28/2024 17:32     Procedures   Medications Ordered in the ED  dexamethasone  (DECADRON ) injection 10 mg (10 mg Intramuscular Given 03/28/24 1637)  ipratropium-albuterol  (DUONEB) 0.5-2.5 (3) MG/3ML nebulizer solution 3 mL (3 mLs Nebulization Given 03/28/24 1645)  ipratropium-albuterol  (DUONEB) 0.5-2.5 (3) MG/3ML nebulizer solution 3 mL (3 mLs Nebulization Given 03/28/24 1803)  albuterol  (VENTOLIN  HFA) 108 (90 Base) MCG/ACT inhaler 2 puff (2 puffs Inhalation Given 03/28/24 1803)                                    Medical Decision Making Amount and/or Complexity of Data Reviewed Radiology: ordered.  Risk Prescription drug management.   Patient presents outlines.  She does test positive for influenza A.  She has been symptomatic for 5 days.  Patient also has underlying asthma.  She did get started on a Medrol  Dosepak day before yesterday and ipratropium  twice daily.  She feels like the symptoms are still worsening.  Clinically patient does not have objective shortness of breath at rest.  She does however have wheeze and cough paroxysmal with deep inspiration.  Will administer DuoNeb and Decadron  IM shot.  Chest x-ray interpreted radiology clear.  Influenza A positive.  After first breathing treatment patient did feel subjective improvement and repeat pulmonary auscultation showed much improved air movement with minimal wheezing.  Will repeat Pete 1 more treatment.  Patient was treated with the Decadron  and she is counseled she can discontinue the  oral Solu-Medrol .  She had the impression that it was worsening the symptoms after taking it.  Patient had run out of her albuterol  and was using her daughters.  Have provided an inhaler through the emergency department.  At this time she does not show signs of any impending respiratory failure or significant distress.  No hypoxia no significant increased work of breathing.  Home management and return precautions reviewed.     Final diagnoses:  Influenza A  Moderate persistent asthma with exacerbation    ED Discharge Orders     None          Armenta Canning, MD 03/28/24 1823  "

## 2024-03-29 ENCOUNTER — Other Ambulatory Visit (HOSPITAL_COMMUNITY): Payer: Self-pay

## 2024-03-29 ENCOUNTER — Telehealth: Payer: Self-pay | Admitting: Family Medicine

## 2024-03-29 ENCOUNTER — Other Ambulatory Visit: Payer: Self-pay | Admitting: Family Medicine

## 2024-03-29 MED ORDER — ESCITALOPRAM OXALATE 20 MG PO TABS
20.0000 mg | ORAL_TABLET | Freq: Every day | ORAL | 3 refills | Status: AC
  Filled 2024-03-29: qty 90, 90d supply, fill #0

## 2024-03-29 NOTE — Telephone Encounter (Signed)
 Final disposition: See HCP within 4 hours. Per patient's chart, she went to ED. Please advise on further action.   Patient Name First: Jennifer Last: Dean Gender: Female DOB: [birth year] Age: 43 Y 3 D Return Phone Number: [phone] (Primary) Address: City/ State/ Zip: [place]  [address] Client [facility] [employer] Provider Jodie Gammons- MD Contact Type Call Who Is Calling Patient / Member / Family / Caregiver Call Type Triage / Clinical Relationship To Patient Self Return Phone Number [phone] (Primary) Chief Complaint BREATHING - shortness of breath or sounds breathless Reason for Call Symptomatic / Request for Health Information Initial Comment Caller has a cough and SOB. Translation No Nurse Assessment Nurse: Easter, RN, Timothy Date/Time (Eastern Time): 03/28/2024 6:22:23 AM Confirm and document reason for call. If symptomatic, describe symptoms. ---Patient states because of her dtr's flu like symptoms her Dr. started the patient on Methylprednisolone . Caller confirms she had a cough but since starting the Methylprednisolone  on Tuesday her cough has gotten worse and she is having shortness of breath while standing. Caller states she was dizzy but now that she is sitting down she is not. Patient states she does not know where her Albuterol  is but she has her dtr's Albuterol  inhaler. Does the patient have any new or worsening symptoms? ---Yes Will a triage be completed? ---Yes Related visit to physician within the last 2 weeks? ---No Does the PT have any chronic conditions? (i.e. diabetes, asthma, this includes High risk factors for pregnancy, etc.) ---Yes List chronic conditions. ---Asthma, GERD, allergies. Is the patient pregnant or possibly pregnant? (Ask all females between the ages of 57-55) ---No Is this a behavioral health or substance abuse call?  ---No Guidelines Guideline Title Affirmed Question Affirmed Notes Nurse Date/Time (Eastern Time) Influenza (Flu) Suspected MILD difficulty breathing (e.g., minimal/no SOB at rest, SOB with walking, pulse < 100) Bohannon, RN, Timothy 03/28/2024 6:27:02 AM Disp. Time Titus Time) Disposition Final User 03/28/2024 6:19:29 AM Send to Urgent Queue Burnis Led 03/28/2024 6:37:03 AM See HCP (or PCP Triage) Within 4 Hours Yes Easter, RN, Evalene Final Disposition 03/28/2024 6:37:03 AM See HCP (or PCP Triage) Within 4 Hours Yes Bohannon, RN, Evalene [place] Disagree/Comply Disagree Caller Understands Yes PreDisposition InappropriateToAsk Care Advice Given Per Guideline * IF OFFICE WILL BE OPEN: You need to be seen within the next 3 or 4 hours. Call your doctor (or NP/PA) now or as soon as the office opens. SEE HCP (OR PCP TRIAGE) WITHIN 4 HOURS: CALL BACK IF: * You become worse CARE ADVICE given per Influenza (Flu) - Suspected (Adult) guideline. WEAR A MASK - COVER YOUR MOUTH AND NOSE: * If you are around others, wear a mask that fits snuggly over your mouth and nose. Comments User: Evalene Easter, RN Date/Time Titus Time): 03/28/2024 6:38:22 AM Caller states she won't be able to go see a doctor within the next four hours because of her two small kids but she will try and go later. Referrals GO TO FACILITY UNDECIDED

## 2024-04-01 ENCOUNTER — Other Ambulatory Visit (HOSPITAL_COMMUNITY): Payer: Self-pay

## 2024-04-01 ENCOUNTER — Telehealth: Admitting: Physician Assistant

## 2024-04-01 DIAGNOSIS — J019 Acute sinusitis, unspecified: Secondary | ICD-10-CM | POA: Diagnosis not present

## 2024-04-01 DIAGNOSIS — B9689 Other specified bacterial agents as the cause of diseases classified elsewhere: Secondary | ICD-10-CM | POA: Diagnosis not present

## 2024-04-01 MED ORDER — AMOXICILLIN-POT CLAVULANATE 875-125 MG PO TABS
1.0000 | ORAL_TABLET | Freq: Two times a day (BID) | ORAL | 0 refills | Status: AC
  Filled 2024-04-01: qty 14, 7d supply, fill #0

## 2024-04-01 NOTE — Progress Notes (Signed)

## 2024-04-23 ENCOUNTER — Other Ambulatory Visit: Payer: Self-pay | Admitting: Obstetrics and Gynecology

## 2024-04-23 DIAGNOSIS — N939 Abnormal uterine and vaginal bleeding, unspecified: Secondary | ICD-10-CM

## 2024-04-25 ENCOUNTER — Encounter (HOSPITAL_COMMUNITY): Payer: Self-pay

## 2024-04-25 ENCOUNTER — Telehealth: Payer: Self-pay | Admitting: Family Medicine

## 2024-04-25 ENCOUNTER — Other Ambulatory Visit (HOSPITAL_COMMUNITY): Payer: Self-pay

## 2024-04-25 MED ORDER — LEVONORGESTREL-ETHINYL ESTRAD 0.1-20 MG-MCG PO TABS
1.0000 | ORAL_TABLET | Freq: Every day | ORAL | 4 refills | Status: AC
  Filled 2024-04-25: qty 28, 21d supply, fill #0
  Filled 2024-04-25: qty 112, 84d supply, fill #0
  Filled 2024-05-09: qty 28, 21d supply, fill #0
  Filled 2024-05-10: qty 112, 84d supply, fill #0

## 2024-04-25 NOTE — Telephone Encounter (Signed)
 Per chart review, Dr. Jeralyn has sent in refill Rx today. I called pt and informed her. She voiced understanding.

## 2024-04-25 NOTE — Telephone Encounter (Signed)
 Pt is calling because she needs her BC refilled. Her pharmacy has requested a refill from us  and has not gotten a response. She is needing this filled asap because tomorrow is her last day. She will like a call back when the issue has been resolved so that she can pick up her medicine.

## 2024-05-02 ENCOUNTER — Other Ambulatory Visit (HOSPITAL_COMMUNITY): Payer: Self-pay

## 2024-05-03 ENCOUNTER — Other Ambulatory Visit (HOSPITAL_COMMUNITY): Payer: Self-pay

## 2024-05-09 ENCOUNTER — Other Ambulatory Visit (HOSPITAL_COMMUNITY): Payer: Self-pay

## 2024-05-09 ENCOUNTER — Other Ambulatory Visit: Payer: Self-pay | Admitting: Allergy and Immunology

## 2024-05-09 ENCOUNTER — Other Ambulatory Visit: Payer: Self-pay

## 2024-05-09 MED ORDER — LEVOCETIRIZINE DIHYDROCHLORIDE 5 MG PO TABS
5.0000 mg | ORAL_TABLET | Freq: Every day | ORAL | 3 refills | Status: AC | PRN
  Filled 2024-05-09: qty 180, 90d supply, fill #0

## 2024-05-10 ENCOUNTER — Other Ambulatory Visit: Payer: Self-pay

## 2024-05-10 ENCOUNTER — Other Ambulatory Visit (HOSPITAL_COMMUNITY): Payer: Self-pay

## 2024-06-03 ENCOUNTER — Telehealth: Payer: Self-pay | Admitting: Obstetrics and Gynecology

## 2025-01-17 ENCOUNTER — Encounter: Admitting: Family Medicine

## 2025-02-25 ENCOUNTER — Ambulatory Visit: Admitting: Allergy and Immunology
